# Patient Record
Sex: Female | Born: 1970 | Race: White | Hispanic: No | Marital: Married | Smoking: Never smoker
Health system: Southern US, Community
[De-identification: ages and names within clinical notes are randomized; demographics above are authoritative.]

## PROBLEM LIST (undated history)

## (undated) DIAGNOSIS — C4492 Squamous cell carcinoma of skin, unspecified: Secondary | ICD-10-CM

## (undated) DIAGNOSIS — K449 Diaphragmatic hernia without obstruction or gangrene: Secondary | ICD-10-CM

## (undated) DIAGNOSIS — J45909 Unspecified asthma, uncomplicated: Secondary | ICD-10-CM

## (undated) DIAGNOSIS — E785 Hyperlipidemia, unspecified: Secondary | ICD-10-CM

## (undated) DIAGNOSIS — A630 Anogenital (venereal) warts: Secondary | ICD-10-CM

## (undated) DIAGNOSIS — K589 Irritable bowel syndrome without diarrhea: Secondary | ICD-10-CM

## (undated) DIAGNOSIS — T7840XA Allergy, unspecified, initial encounter: Secondary | ICD-10-CM

## (undated) DIAGNOSIS — D229 Melanocytic nevi, unspecified: Secondary | ICD-10-CM

## (undated) DIAGNOSIS — K645 Perianal venous thrombosis: Secondary | ICD-10-CM

## (undated) DIAGNOSIS — N809 Endometriosis, unspecified: Secondary | ICD-10-CM

## (undated) DIAGNOSIS — G43909 Migraine, unspecified, not intractable, without status migrainosus: Secondary | ICD-10-CM

## (undated) DIAGNOSIS — B009 Herpesviral infection, unspecified: Secondary | ICD-10-CM

## (undated) DIAGNOSIS — Z8619 Personal history of other infectious and parasitic diseases: Secondary | ICD-10-CM

## (undated) DIAGNOSIS — R896 Abnormal cytological findings in specimens from other organs, systems and tissues: Secondary | ICD-10-CM

## (undated) HISTORY — PX: BREAST BIOPSY: SHX20

## (undated) HISTORY — DX: Personal history of other infectious and parasitic diseases: Z86.19

## (undated) HISTORY — DX: Diaphragmatic hernia without obstruction or gangrene: K44.9

## (undated) HISTORY — DX: Anogenital (venereal) warts: A63.0

## (undated) HISTORY — DX: Perianal venous thrombosis: K64.5

## (undated) HISTORY — PX: AUGMENTATION MAMMAPLASTY: SUR837

## (undated) HISTORY — DX: Abnormal cytological findings in specimens from other organs, systems and tissues: R89.6

## (undated) HISTORY — DX: Allergy, unspecified, initial encounter: T78.40XA

## (undated) HISTORY — DX: Unspecified asthma, uncomplicated: J45.909

## (undated) HISTORY — DX: Herpesviral infection, unspecified: B00.9

## (undated) HISTORY — DX: Migraine, unspecified, not intractable, without status migrainosus: G43.909

## (undated) HISTORY — DX: Endometriosis, unspecified: N80.9

## (undated) HISTORY — DX: Irritable bowel syndrome, unspecified: K58.9

## (undated) HISTORY — DX: Hyperlipidemia, unspecified: E78.5

---
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
## 1994-03-10 DIAGNOSIS — N809 Endometriosis, unspecified: Secondary | ICD-10-CM

## 1994-03-10 HISTORY — PX: EXPLORATORY LAPAROTOMY: SUR591

## 1994-03-10 HISTORY — DX: Endometriosis, unspecified: N80.9

## 1997-05-10 DIAGNOSIS — IMO0001 Reserved for inherently not codable concepts without codable children: Secondary | ICD-10-CM

## 1997-05-10 HISTORY — DX: Reserved for inherently not codable concepts without codable children: IMO0001

## 1997-10-19 ENCOUNTER — Other Ambulatory Visit: Admission: RE | Admit: 1997-10-19 | Discharge: 1997-10-19 | Payer: Self-pay | Admitting: *Deleted

## 1997-12-04 ENCOUNTER — Encounter: Payer: Self-pay | Admitting: Internal Medicine

## 1998-05-14 ENCOUNTER — Other Ambulatory Visit: Admission: RE | Admit: 1998-05-14 | Discharge: 1998-05-14 | Payer: Self-pay | Admitting: *Deleted

## 1998-07-29 ENCOUNTER — Encounter: Payer: Self-pay | Admitting: Internal Medicine

## 1998-07-29 ENCOUNTER — Encounter: Payer: Self-pay | Admitting: Emergency Medicine

## 1998-07-29 ENCOUNTER — Emergency Department (HOSPITAL_COMMUNITY): Admission: EM | Admit: 1998-07-29 | Discharge: 1998-07-29 | Payer: Self-pay | Admitting: Emergency Medicine

## 1998-09-26 ENCOUNTER — Other Ambulatory Visit: Admission: RE | Admit: 1998-09-26 | Discharge: 1998-09-26 | Payer: Self-pay | Admitting: *Deleted

## 1999-04-03 ENCOUNTER — Other Ambulatory Visit: Admission: RE | Admit: 1999-04-03 | Discharge: 1999-04-03 | Payer: Self-pay | Admitting: *Deleted

## 1999-08-11 DIAGNOSIS — A630 Anogenital (venereal) warts: Secondary | ICD-10-CM

## 1999-08-11 HISTORY — DX: Anogenital (venereal) warts: A63.0

## 1999-09-30 ENCOUNTER — Other Ambulatory Visit: Admission: RE | Admit: 1999-09-30 | Discharge: 1999-09-30 | Payer: Self-pay | Admitting: *Deleted

## 2000-02-08 HISTORY — PX: BREAST ENHANCEMENT SURGERY: SHX7

## 2000-04-28 ENCOUNTER — Other Ambulatory Visit: Admission: RE | Admit: 2000-04-28 | Discharge: 2000-04-28 | Payer: Self-pay | Admitting: *Deleted

## 2000-05-11 ENCOUNTER — Encounter: Admission: RE | Admit: 2000-05-11 | Discharge: 2000-05-11 | Payer: Self-pay | Admitting: *Deleted

## 2000-05-11 ENCOUNTER — Encounter: Payer: Self-pay | Admitting: *Deleted

## 2000-07-10 HISTORY — PX: BREAST MASS EXCISION: SHX1267

## 2000-07-26 ENCOUNTER — Encounter (INDEPENDENT_AMBULATORY_CARE_PROVIDER_SITE_OTHER): Payer: Self-pay

## 2000-07-26 ENCOUNTER — Ambulatory Visit (HOSPITAL_COMMUNITY): Admission: RE | Admit: 2000-07-26 | Discharge: 2000-07-26 | Payer: Self-pay | Admitting: General Surgery

## 2001-06-30 ENCOUNTER — Other Ambulatory Visit: Admission: RE | Admit: 2001-06-30 | Discharge: 2001-06-30 | Payer: Self-pay | Admitting: *Deleted

## 2002-07-19 ENCOUNTER — Other Ambulatory Visit: Admission: RE | Admit: 2002-07-19 | Discharge: 2002-07-19 | Payer: Self-pay | Admitting: *Deleted

## 2002-12-20 ENCOUNTER — Other Ambulatory Visit: Admission: RE | Admit: 2002-12-20 | Discharge: 2002-12-20 | Payer: Self-pay | Admitting: *Deleted

## 2003-07-27 ENCOUNTER — Other Ambulatory Visit: Admission: RE | Admit: 2003-07-27 | Discharge: 2003-07-27 | Payer: Self-pay | Admitting: *Deleted

## 2003-11-26 ENCOUNTER — Other Ambulatory Visit: Admission: RE | Admit: 2003-11-26 | Discharge: 2003-11-26 | Payer: Self-pay | Admitting: *Deleted

## 2004-01-03 ENCOUNTER — Other Ambulatory Visit: Admission: RE | Admit: 2004-01-03 | Discharge: 2004-01-03 | Payer: Self-pay | Admitting: *Deleted

## 2004-02-06 ENCOUNTER — Ambulatory Visit (HOSPITAL_COMMUNITY): Admission: RE | Admit: 2004-02-06 | Discharge: 2004-02-06 | Payer: Self-pay | Admitting: Internal Medicine

## 2004-02-06 ENCOUNTER — Encounter (INDEPENDENT_AMBULATORY_CARE_PROVIDER_SITE_OTHER): Payer: Self-pay | Admitting: Specialist

## 2004-02-06 ENCOUNTER — Encounter (INDEPENDENT_AMBULATORY_CARE_PROVIDER_SITE_OTHER): Payer: Self-pay | Admitting: *Deleted

## 2004-02-06 ENCOUNTER — Encounter: Payer: Self-pay | Admitting: Internal Medicine

## 2004-07-30 ENCOUNTER — Other Ambulatory Visit: Admission: RE | Admit: 2004-07-30 | Discharge: 2004-07-30 | Payer: Self-pay | Admitting: *Deleted

## 2005-01-08 ENCOUNTER — Other Ambulatory Visit: Admission: RE | Admit: 2005-01-08 | Discharge: 2005-01-08 | Payer: Self-pay | Admitting: *Deleted

## 2005-04-17 ENCOUNTER — Other Ambulatory Visit: Admission: RE | Admit: 2005-04-17 | Discharge: 2005-04-17 | Payer: Self-pay | Admitting: *Deleted

## 2005-06-30 IMAGING — NM NM HEPATO W/GB/PHARM/[PERSON_NAME]
1 series · 6 of 6 positions shown · non-contrast
Comparison: none

CLINICAL DATA: Abdominal pain.  
 HEPATOBILIARY NUCLEAR MEDICINE STUDY WITH EJECTION FRACTION
 After the intravenous injection of 8.3 mCi of 22mMc labeled Choletec, a series of scans of the liver showed prompt excretion by the normal size and contoured liver.  There is definite filling of the gallbladder at 15 minutes and emptying into the bowel at 20 minutes.  The patient was then given 1.4 mcg of CCK and revealed a 30-minute ejection fraction of 35% and a 60-minute ejection fraction of 44% both of which are within the limits of normal. 
 IMPRESSION
 Normal radioisotopic hepatobiliary scan with ejection fraction of 44% at 60 minutes.

[hepatobiliary · 1.20mm/px · 6 of 7 frames shown]
[frame 1/7]
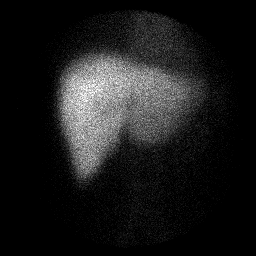
[frame 2/7]
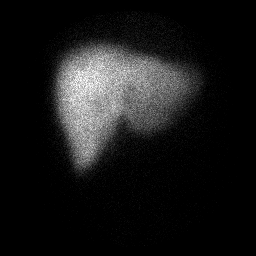
[frame 3/7]
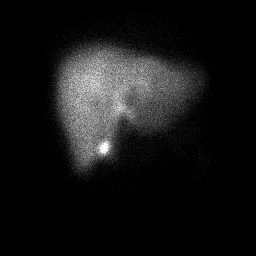
[frame 4/7]
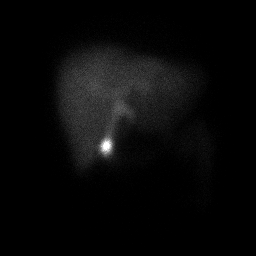
[frame 5/7]
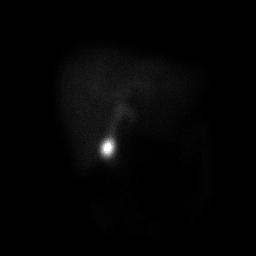
[frame 7/7]
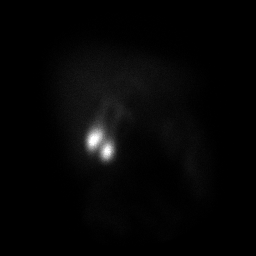

[6 of 6 positions shown; findings below may reference images not displayed]

## 2005-07-07 ENCOUNTER — Other Ambulatory Visit: Admission: RE | Admit: 2005-07-07 | Discharge: 2005-07-07 | Payer: Self-pay | Admitting: Obstetrics and Gynecology

## 2005-09-03 ENCOUNTER — Other Ambulatory Visit: Admission: RE | Admit: 2005-09-03 | Discharge: 2005-09-03 | Payer: Self-pay | Admitting: Obstetrics and Gynecology

## 2006-09-30 ENCOUNTER — Other Ambulatory Visit: Admission: RE | Admit: 2006-09-30 | Discharge: 2006-09-30 | Payer: Self-pay | Admitting: Obstetrics & Gynecology

## 2006-10-15 DIAGNOSIS — D229 Melanocytic nevi, unspecified: Secondary | ICD-10-CM

## 2006-10-15 HISTORY — DX: Melanocytic nevi, unspecified: D22.9

## 2006-11-23 ENCOUNTER — Other Ambulatory Visit: Admission: RE | Admit: 2006-11-23 | Discharge: 2006-11-23 | Payer: Self-pay | Admitting: Obstetrics and Gynecology

## 2007-01-11 ENCOUNTER — Ambulatory Visit: Payer: Self-pay | Admitting: Internal Medicine

## 2007-02-10 ENCOUNTER — Other Ambulatory Visit: Admission: RE | Admit: 2007-02-10 | Discharge: 2007-02-10 | Payer: Self-pay | Admitting: Obstetrics and Gynecology

## 2007-07-18 ENCOUNTER — Other Ambulatory Visit: Admission: RE | Admit: 2007-07-18 | Discharge: 2007-07-18 | Payer: Self-pay | Admitting: Obstetrics and Gynecology

## 2007-11-25 ENCOUNTER — Other Ambulatory Visit: Admission: RE | Admit: 2007-11-25 | Discharge: 2007-11-25 | Payer: Self-pay | Admitting: Obstetrics and Gynecology

## 2007-12-31 DIAGNOSIS — K645 Perianal venous thrombosis: Secondary | ICD-10-CM | POA: Insufficient documentation

## 2008-01-20 ENCOUNTER — Other Ambulatory Visit: Admission: RE | Admit: 2008-01-20 | Discharge: 2008-01-20 | Payer: Self-pay | Admitting: Obstetrics and Gynecology

## 2008-02-15 ENCOUNTER — Emergency Department (HOSPITAL_COMMUNITY): Admission: EM | Admit: 2008-02-15 | Discharge: 2008-02-15 | Payer: Self-pay | Admitting: Emergency Medicine

## 2008-03-04 ENCOUNTER — Emergency Department (HOSPITAL_COMMUNITY): Admission: EM | Admit: 2008-03-04 | Discharge: 2008-03-04 | Payer: Self-pay | Admitting: Emergency Medicine

## 2008-11-29 ENCOUNTER — Other Ambulatory Visit: Admission: RE | Admit: 2008-11-29 | Discharge: 2008-11-29 | Payer: Self-pay | Admitting: Obstetrics and Gynecology

## 2009-05-08 ENCOUNTER — Telehealth: Payer: Self-pay | Admitting: Internal Medicine

## 2009-05-14 DIAGNOSIS — K5900 Constipation, unspecified: Secondary | ICD-10-CM | POA: Insufficient documentation

## 2009-05-14 DIAGNOSIS — K5901 Slow transit constipation: Secondary | ICD-10-CM | POA: Insufficient documentation

## 2009-05-14 DIAGNOSIS — A09 Infectious gastroenteritis and colitis, unspecified: Secondary | ICD-10-CM | POA: Insufficient documentation

## 2009-05-14 DIAGNOSIS — K589 Irritable bowel syndrome without diarrhea: Secondary | ICD-10-CM | POA: Insufficient documentation

## 2009-05-20 ENCOUNTER — Ambulatory Visit: Payer: Self-pay | Admitting: Internal Medicine

## 2009-05-20 LAB — CONVERTED CEMR LAB: Vit D, 25-Hydroxy: 34 ng/mL (ref 30–89)

## 2009-05-22 LAB — CONVERTED CEMR LAB
ALT: 28 units/L (ref 0–35)
AST: 30 units/L (ref 0–37)
Albumin: 4.4 g/dL (ref 3.5–5.2)
Alkaline Phosphatase: 63 units/L (ref 39–117)
BUN: 13 mg/dL (ref 6–23)
Basophils Absolute: 0.4 10*3/uL — ABNORMAL HIGH (ref 0.0–0.1)
Basophils Relative: 4.6 % — ABNORMAL HIGH (ref 0.0–3.0)
CO2: 28 meq/L (ref 19–32)
Calcium: 9.5 mg/dL (ref 8.4–10.5)
Chloride: 101 meq/L (ref 96–112)
Creatinine, Ser: 0.8 mg/dL (ref 0.4–1.2)
Eosinophils Absolute: 0.1 10*3/uL (ref 0.0–0.7)
Eosinophils Relative: 0.8 % (ref 0.0–5.0)
GFR calc non Af Amer: 84.5 mL/min (ref >60)
Glucose, Bld: 78 mg/dL (ref 70–99)
HCT: 40.7 % (ref 36.0–46.0)
Hemoglobin: 13.8 g/dL (ref 12.0–15.0)
Iron: 125 ug/dL (ref 42–145)
Lymphocytes Relative: 27.4 % (ref 12.0–46.0)
Lymphs Abs: 2.5 10*3/uL (ref 0.7–4.0)
MCHC: 34 g/dL (ref 30.0–36.0)
MCV: 101.9 fL — ABNORMAL HIGH (ref 78.0–100.0)
Monocytes Absolute: 0.9 10*3/uL (ref 0.1–1.0)
Monocytes Relative: 9.6 % (ref 3.0–12.0)
Neutro Abs: 5.4 10*3/uL (ref 1.4–7.7)
Neutrophils Relative %: 57.6 % (ref 43.0–77.0)
Platelets: 322 10*3/uL (ref 150.0–400.0)
Potassium: 4.5 meq/L (ref 3.5–5.1)
RBC: 3.99 M/uL (ref 3.87–5.11)
RDW: 12.3 % (ref 11.5–14.6)
Saturation Ratios: 29.3 % (ref 20.0–50.0)
Sodium: 140 meq/L (ref 135–145)
TSH: 1.69 microintl units/mL (ref 0.35–5.50)
Total Bilirubin: 1 mg/dL (ref 0.3–1.2)
Total Protein: 7.6 g/dL (ref 6.0–8.3)
Transferrin: 304.6 mg/dL (ref 212.0–360.0)
WBC: 9.3 10*3/uL (ref 4.5–10.5)

## 2009-05-23 ENCOUNTER — Ambulatory Visit: Payer: Self-pay | Admitting: Internal Medicine

## 2009-05-23 LAB — CONVERTED CEMR LAB: Retic Ct Pct: 1.2 % (ref 0.4–3.1)

## 2009-05-27 LAB — CONVERTED CEMR LAB
Folate: >20 ng/mL
Vitamin B-12: 620 pg/mL (ref 211–911)

## 2009-05-30 ENCOUNTER — Ambulatory Visit (HOSPITAL_COMMUNITY): Admission: RE | Admit: 2009-05-30 | Discharge: 2009-05-30 | Payer: Self-pay | Admitting: Internal Medicine

## 2009-05-30 ENCOUNTER — Ambulatory Visit: Payer: Self-pay | Admitting: Internal Medicine

## 2009-06-04 ENCOUNTER — Encounter: Payer: Self-pay | Admitting: Internal Medicine

## 2009-06-17 ENCOUNTER — Telehealth: Payer: Self-pay | Admitting: Internal Medicine

## 2009-10-03 ENCOUNTER — Encounter: Payer: Self-pay | Admitting: Internal Medicine

## 2010-08-31 ENCOUNTER — Encounter: Payer: Self-pay | Admitting: Neurosurgery

## 2010-09-09 NOTE — Assessment & Plan Note (Signed)
Summary: WORSENING CONSTIPATION          [name] [name]    History of Present Illness Visit Type: follow up  Primary GI MD: Lina Sar MD Primary Provider: n/a Requesting Provider: n/a Chief Complaint: Lower abd pain, worsening constipation, and dark blood in stool History of Present Illness:   [name] [name] is a 40 year old white female with a history of irritable bowel syndrome and constipation. We saw her most recently in 2008 with a thrombosed internal hemorrhoid. Her last colonoscopy done in April 1999 was normal to the cecum. Patient comes today complaining of a recurrence in her constipation. 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 She is very busy working 12 hours a day. She has had irritable bowel syndrome for many years which was initially thought to be Crohn's disease. Over the years, it became more apparent that she has functional gastrointestinal oproblems. Her last upper endoscopy in 2005 showed a 23 cm hiatal hernia. She does not take any acid reducers although she is nauseated at times. Her upper abdominal ultrasound in 1992 was negative. She has taken over-the-counter laxatives such as magnesium citrate. MiraLax does not seem to help.   GI Review of Systems      Denies abdominal pain, acid reflux, belching, bloating, chest pain, dysphagia with liquids, dysphagia with solids, heartburn, loss of appetite, nausea, vomiting, vomiting blood, weight loss, and  weight gain.      Reports constipation and  rectal bleeding.     Denies anal fissure, black tarry stools, change in bowel habit, diarrhea, diverticulosis, fecal incontinence, heme positive stool, hemorrhoids, irritable bowel syndrome, jaundice, light color stool, liver problems, and  rectal pain.    Current Medications (verified): 1)  None  Allergies (verified): 1)  ! Sulfa 2)  ! Jonne Ply  Past History:  Past Medical History: Reviewed history from 05/14/2009 and no changes required. Current Problems:  CONSTIPATION  (ICD-564.00) Hx of INFECTIOUS COLITIS ENTERITIS AND GASTROENTERITIS (ICD-009.0) IRRITABLE BOWEL SYNDROME (ICD-564.1) INTERNAL THROMBOSED HEMORRHOIDS (ICD-455.1)      Past Surgical History: Reviewed history from 05/14/2009 and no changes required. unremarkable  Family History: Reviewed history from 05/14/2009 and no changes required. Family History of Irritable Bowel Syndrome: Mother Family History of Peptic Ulcer Disease: Grandfather No FH of Colon Cancer:  Social History: Reviewed history from 05/14/2009 and no changes required. Alcohol Use - no Illicit Drug Use - no  Review of Systems  The patient denies allergy/sinus, anemia, anxiety-new, arthritis/joint pain, back pain, blood in urine, breast changes/lumps, change in vision, confusion, cough, coughing up blood, depression-new, fainting, fatigue, fever, headaches-new, hearing problems, heart murmur, heart rhythm changes, itching, menstrual pain, muscle pains/cramps, night sweats, nosebleeds, pregnancy symptoms, shortness of breath, skin rash, sleeping problems, sore throat, swelling of feet/legs, swollen lymph glands, thirst - excessive , urination - excessive , urination changes/pain, urine leakage, vision changes, and voice change.         Pertinent positive and negative review of systems were noted in the above HPI. All other ROS was otherwise negative.   Vital Signs:  Patient profile:   40 year old female Height:      67 inches Weight:      126 pounds BMI:     19.81 BSA:     1.66 Pulse rate:   100 / minute Pulse rhythm:   regular BP sitting:   118 / 74  (left arm) Cuff size:   regular  Vitals Entered By: Ok Anis CMA (May 20, 2009 9:11 AM)  Physical Exam  General:  healthy-appearing, alert, oriented and very cooperative. Eyes:  nonicteric. Mouth:  no oral ulcers. Neck:  thyroid normal. Lungs:  Clear throughout to auscultation. Heart:  rapid S1-S2, no murmur. Abdomen:  soft abdomen with normal active  bowel sounds, few high-pitched rushes. Initial tenderness in the right lower quadrant disappeared after repeated palpation,slight tympany throughout the abdomen. Liver edge at costal margin, no CVA tenderness. Rectal:  normal rectal tone. Stool is soft and hemoccult-negative. Extremities:  No clubbing, cyanosis, edema or deformities noted. Skin:  there are no skin lesions. There was a tattoo on the right ankle. Psych:  Alert and cooperative. Normal mood and affect.   Impression & Recommendations:  Problem # 1:  IRRITABLE BOWEL SYNDROME (ICD-564.1) Patient has irritable bowel syndrome with predominant constipation as well as a long history of functional gastrointestinal problems. We will give her a trial ofAmitiza 24 mcg twice a day and an empirical trial of Xifaxin 550 mg one tablet 3 times a day. We will schedule a colonoscopy and upper abdominal ultrasound as well. I would also give consideration to using psychotropic agents such as Paxil. We will obtain baseline chemistries today.  Orders: TLB-CBC Platelet - w/Differential (85025-CBCD) TLB-IBC Pnl (Iron/FE;Transferrin) (83550-IBC) TLB-TSH (Thyroid Stimulating Hormone) (84443-TSH) TLB-CMP (Comprehensive Metabolic Pnl) (80053-COMP) T-Vitamin D (25-Hydroxy) ([address])  Problem # 2:  INTERNAL THROMBOSED HEMORRHOIDS (ICD-455.1) not a problem at this time.  Other Orders: Colonoscopy (Colon) Ultrasound Abdomen (UAS)  Patient Instructions: 1)  CBC, metabolic panel, TSH, sedimentation rate, vitamin D levels, and iron levels. 2)  Xifaxin 550 mg one p.o. t.i.d. 3)  Amitiza 24 mcg p.o. b.i.d. 4)  Colonoscopy 5)  Abdominal ultrasound Prescriptions: DULCOLAX 5 MG  TBEC (BISACODYL) Day before procedure take 2 at 3pm and 2 at 8pm.  #4 x 0   Entered by:   Hortense Ramal CMA (AAMA)   Authorized by:   Hart Carwin MD   Signed by:   Hortense Ramal CMA (AAMA) on 05/20/2009   Method used:   [facility]        [facility]  [address] (retail)       [address]       [place], [place]  [address]       Ph: [phone] or [phone]       Fax: [phone]   RxID:   [id number] REGLAN 10 MG  TABS (METOCLOPRAMIDE HCL) As per prep instructions.  #2 x 0   Entered by:   Hortense Ramal CMA (AAMA)   Authorized by:   Hart Carwin MD   Signed by:   Hortense Ramal CMA (AAMA) on 05/20/2009   Method used:   [facility]        [facility] (retail)       [address]       [place], [place]  [address]       Ph: [phone] or [phone]       Fax: [phone]   RxID:   [id number] MIRALAX   POWD (POLYETHYLENE GLYCOL 3350) As per prep  instructions.  #255gm x 0   Entered by:   Hortense Ramal CMA (AAMA)   Authorized by:   Hart Carwin MD   Signed by:   Hortense Ramal CMA (AAMA) on 05/20/2009   Method used:   [facility]        [facility] [phone][facility] (retail)       [address]  [place], [place]  [address]       Ph: [phone] or [phone]       Fax: [phone]   RxID:   [id number] AMITIZA 24 MCG CAPS (LUBIPROSTONE) Take 1 tablet by mouth two times a day  #60 x 1   Entered by:   Hortense Ramal CMA (AAMA)   Authorized by:   Hart Carwin MD   Signed by:   Hortense Ramal CMA (AAMA) on 05/20/2009   Method used:   [facility]        [facility] ([facility]ail)       [address]       [place], [place]  [address]       Ph: [phone] or [phone]       Fax: [phone]   RxID:   [id number] XIFAXAN 550 MG TABS (RIFAXIMIN) Take 1 tablet by mouth three times a day x 1 week  you should already have 6 sample tablets.)  #15 x 0   Entered by:   Hortense Ramal CMA (AAMA)   Authorized by:   Hart Carwin MD   Signed by:   Hortense Ramal CMA (AAMA) on 05/20/2009   Method used:   [facility]        [facility] (retail)       [address]       [place], [place]  [address]       Ph: [phone] or [phone]       Fax:  [phone]   RxID:   [id number]

## 2010-09-09 NOTE — Miscellaneous (Signed)
Summary: Alprazolam  Clinical Lists Changes  Medications: Added new medication of ALPRAZOLAM 1 MG TABS (ALPRAZOLAM) Take 1 tablet by mouth at bedtime - Signed Rx of ALPRAZOLAM 1 MG TABS (ALPRAZOLAM) Take 1 tablet by mouth at bedtime;  #30 x 1;  Signed;  Entered by: Hortense Ramal CMA (AAMA);  Authorized by: Hart Carwin MD;  Method used: Printed then [facility] [place], [place]  [address], Ph: [phone] or [phone], Fax: [phone]    Prescriptions: ALPRAZOLAM 1 MG TABS (ALPRAZOLAM) Take 1 tablet by mouth at bedtime  #30 x 1   Entered by:   Hortense Ramal CMA (AAMA)   Authorized by:   Hart Carwin MD   Signed by:   Hortense Ramal CMA (AAMA) on 10/03/2009   Method used:   Printed then faxed to ...       [facility] (retail)       [address]       [place], [place]  [address]       Ph: [phone] or [phone]       Fax: [phone]   RxID:   [id number]

## 2010-09-09 NOTE — Miscellaneous (Signed)
Summary: miralax rx  Clinical Lists Changes  Medications: Added new medication of MIRALAX   POWD (POLYETHYLENE GLYCOL 3350) Take 17 gm once a day - Signed Rx of MIRALAX   POWD (POLYETHYLENE GLYCOL 3350) Take 17 gm once a day;  #1 x 11;  Signed;  Entered by: Burman Foster RN;  Authorized by: Hart Carwin MD;  Method used: [facility] [place], [place]  [address], Ph: [phone] or [phone], Fax: [phone]    Prescriptions: MIRALAX   POWD (POLYETHYLENE GLYCOL 3350) Take 17 gm once a day  #1 x 11   Entered by:   Burman Foster RN   Authorized by:   Hart Carwin MD   Signed by:   Burman Foster RN on 05/30/2009   Method used:   [facility]ronically [facility]        [facility] (retail)       [address]       [place], [place]  [address]       Ph: [phone] or [phone]       Fax: [phone]   RxID:   [id number]

## 2010-09-09 NOTE — Procedures (Signed)
Summary: Gastroenterology colon  Gastroenterology colon   Imported By: Donneta Romberg Endo Tech 12/31/2007 12:09:36  _____________________________________________________________________  External Attachment:    Type:   Image     Comment:   External Document

## 2010-09-09 NOTE — Medication Information (Signed)
Summary: P [facility]  [facility]   Imported By: Lester Custer 06/06/2009 12:31:38  _____________________________________________________________________  External Attachment:    Type:   Image     Comment:   External Document

## 2010-09-09 NOTE — Progress Notes (Signed)
Summary: TRIAGE--constipation   Phone Note Call from Patient Call back at Home Phone [phone]   Caller: Patient Call For: Dr. Juanda Chance Reason for Call: Talk to Nurse Summary of Call: pt going days at a time without having a BM... discomfort in abd...  Initial call taken by: Vallarie Mare,  May 08, 2009 12:24 PM  Follow-up for Phone Call        Pt. c/o worsening constipation. Also has abd. pain when she has a BM, can go up to 4 days without a BM.  Denies blood,black stools, fever,n/v.   1) Miralax 17gm mixed in 8oz. water or juice-daily.May use BID PRN. 2) High fiber diet with ample daily fluids 3) See Dr.Brodie on 05-20-09 at 8:45am. 4) I will call pt., if new orders, after MD reviews.  Follow-up by: Laureen Ochs LPN,  May 08, 2009 12:54 PM  Additional Follow-up for Phone Call Additional follow up Details #1::        reviewed and agree. Additional Follow-up by: Hart Carwin MD,  May 08, 2009 10:50 PM

## 2010-09-09 NOTE — Progress Notes (Signed)
Summary: TRIAGE   Phone Note Call from Patient Call back at Home Phone [phone]   Call For: Dr Juanda Chance Summary of Call: New medicineAmphetamine is causing alot of side effects. Initial call taken by: Leanor Kail [facility],  June 17, 2009 1:45 PM  Follow-up for Phone Call        Pt. began Amphetamine salts 20mg  daily on 06-04-09.  Pt. c/o feeling  less focused, very dizzy, out of control, real jittery, high feeling, bad headaches. She had to stop taking it due to the severe side effects. Pt. declines to see a PCP to get a complete eval. for her issues, wants Dr.Brodie to prescribe something else.  DR.BRODIE PLEASE ADVISE  Follow-up by: Laureen Ochs LPN,  June 17, 2009 2:17 PM  Additional Follow-up for Phone Call Additional follow up Details #1::        She has Xanax to take for anxiety, she may take 1/2 of it during the day. I don/t really know what she needs, her PCP may know. Additional Follow-up by: Hart Carwin MD,  June 17, 2009 8:10 PM    Additional Follow-up for Phone Call Additional follow up Details #2::    Above MD orders reviewed with patient. Pt. instructed to call back as needed.  Follow-up by: Laureen Ochs LPN,  June 18, 2009 10:28 AM

## 2010-09-09 NOTE — Procedures (Signed)
Summary: Colonoscopy  Patient: [name] Record Note: All result statuses are Final unless otherwise noted.  Tests: (1) Colonoscopy (COL)   COL Colonoscopy           DONE     [facility]     [address]     [place], [place]  [address]           COLONOSCOPY PROCEDURE REPORT           PATIENT:  [name] [name], [name] [name]  MR#:  [id number]     BIRTHDATE:  06-01-1971, 37 yrs. old  GENDER:  female           ENDOSCOPIST:  Hedwig Morton. Juanda Chance, MD     Referred by:           PROCEDURE DATE:  05/30/2009     PROCEDURE:  Colonoscopy, Diagnostic     ASA CLASS:  Class I     INDICATIONS:  abdominal pain colon 1999 was normal     hx of ? Crohn's but most likely an IBS, now with progressive     constipation; abd. sono today was normal. Xifaxan, Amitiza,           MEDICATIONS:   Versed 10 mg, Fentanyl 100 mcg           DESCRIPTION OF PROCEDURE:   After the risks benefits and     alternatives of the procedure were thoroughly explained, informed     consent was obtained.  Digital rectal exam was performed and     revealed no rectal masses.   The LB [id number] endoscope     was introduced through the anus and advanced to the cecum, which     was identified by both the appendix and ileocecal valve, without     limitations.  The quality of the prep was poor, using MiraLax.     The instrument was then slowly withdrawn as the colon was fully     examined.     <<PROCEDUREIMAGES>>           FINDINGS:  No polyps or cancers were seen (see image1, image2,     image3, and image4). large amount of sedation, colon sensitive to     pressure of the scope   Retroflexed views in the rectum revealed     no abnormalities.    The scope was then withdrawn from the patient     and the procedure completed.           COMPLICATIONS:  None           ENDOSCOPIC IMPRESSION:     1) No polyps or cancers     2) Normal colonoscopy     RECOMMENDATIONS:     1) high fiber diet     Amitiza 24 ug bid     finish the  Xifaxan     Miralax 17 gm 3x/week           REPEAT EXAM:  In 10 year(s) for.           ______________________________     Hedwig Morton. Juanda Chance, MD           CC:           n.     eSIGNED:   Hedwig Morton. Brodie at 05/30/2009 02:57 PM           Jaquelyn Bitter, [id number]  Note: An exclamation mark (!) indicates a result that was not dispersed into the flowsheet.  Document Creation Date: 05/30/2009 2:57 PM _______________________________________________________________________  (1) Order result status: Final Collection or observation date-time: 05/30/2009 14:46 Requested date-time:  Receipt date-time:  Reported date-time:  Referring Physician:   Ordering Physician: Lina Sar [id number]) Specimen Source:  Source: Launa Grill Order Number: [id number] Lab site:   Appended Document: Colonoscopy    Clinical Lists Changes  Observations: Added new observation of COLONNXTDUE: 05/2019 (05/30/2009 15:12)

## 2010-12-23 NOTE — Assessment & Plan Note (Signed)
[facility]                         GASTROENTEROLOGY OFFICE NOTE   NAME:[name], [name] ORTLOFF                   MRN:          [id number]  DATE:01/11/2007                            DOB:          1970/09/20    [name] [name] is a 40 year old white female with irritable bowel  syndrome and constipation.  She has acute rectal pain which developed 2  weeks ago, which sounds like a thrombosed hemorrhoid.  We asked her  yesterday to come to see [name] to evaluate the problem.  Her last  appointment was 2005 for diarrhea alternating with constipation.  She  does take MiraLax on an occasional basis, as well as Dulcolax tablets.  She has incomplete evacuation.   PHYSICAL EXAMINATION:  Blood pressure 90/66.  Pulse 92.  Weight 136  pounds.  She was alert, oriented, and in no distress.  LUNGS:  Clear to auscultation.  COR:  With normal S1 and normal S2.  Anoscopic exam reveals internal thrombosed hemorrhoids with partial  prolapse, which could be easily reduced.  It is about 1 cm in diameter,  quite tender, but I was able to do the anoscopic exam.  Two other small  hemorrhoids not thrombosed.  Stool was Hemoccult negative.   IMPRESSION:  1. Thrombosed internal hemorrhoid.  2. First grade hemorrhoids.  3. Irritable bowel syndrome with constipation.   PLAN:  1. I think the thrombosed hemorrhoid is actually getting better.  It      is partially absorbed, and not as tense as when it initially      started.  I think conservative treatment may be preferable to      surgical excision and evacuation  We will start Analpram cream 2.5%      to use 3 times a day.  2. Anusol HC suppositories b.i.d.  3. Benefiber samples given, 1 pack daily to improve the bowel habits.     Hedwig Morton. Juanda Chance, MD  Electronically Signed    DMB/MedQ  DD: 01/11/2007  DT: 01/11/2007  Job #: [id number]

## 2010-12-26 NOTE — Op Note (Signed)
[facility]  Patient:    [name] [name], [name] [name]                   MRN: [id number] Proc. Date: 07/26/00 Adm. Date:  [id number] Attending:  Carson Myrtle                           Operative Report  PREOPERATIVE DIAGNOSIS:  Right breast mass.  POSTOPERATIVE DIAGNOSIS:  Right breast mass.  PROCEDURE:  Excision of right breast mass.  SURGEON:  Timothy E. Earlene Plater, M.D.  ANESTHESIA:  Local standby.  INDICATIONS:  This is a 40 year old Caucasian female with a new mass on the right breast.  It has been noted since insertion of implants in July of this year.  She is quite concerned and wishes to proceed.  She is latex allergic.  DESCRIPTION OF PROCEDURE:  The patient was brought to the operating room and placed supine.  Latex allergy precautions observed; IV started sedation given. The right breast prepped and draped in the usual fashion.  The mass was carefully located high in the upper inner quadrant in right breast, skin marked, 0.25% Marcaine with epinephrine used for local anesthesia.  A small incision over the palpable mass.  The subcutaneous tissue dissected, the mass identified and bluntly dissected from surrounding breast tissue.  Submitted in saline to the pathology lab.  Cautery used for hemostasis.  The breast tissue approximated with 3-0 Vicryl.  Skin closed with 4-0 Monocryl.  Steri-Strips applied.  She tolerated it well.  There were no complications.  Counts correct.  She was removed to the recovery room in good condition.  Written and verbal instructions were given to her and her family including Darvocet-N #24,  She will be seen and followed as an outpatient. DD:  07/26/00 TD:  07/26/00 Job: [id number]

## 2010-12-26 NOTE — Op Note (Signed)
NAME:  [name] [name], [name] [name]                      ACCOUNT NO.:  [id number]   MEDICAL RECORD NO.:  [id number]                   PATIENT TYPE:  AMB   LOCATION:  ENDO                                 FACILITY:  [facility]   PHYSICIAN:  Lina Sar, M.D. [facility]               DATE OF BIRTH:  1971/04/01   DATE OF PROCEDURE:  02/06/2004  DATE OF DISCHARGE:                                 OPERATIVE REPORT   PROCEDURE:  Upper endoscopy.   INDICATIONS:  This 40 year old white female has chronic gastrointestinal  problems which were initially attributed to Crohn's disease but later on the  diagnosis has been changed to irritable bowel syndrome.  She has recently  had more nausea and vomiting as well as diarrhea and constipation.  She has  a history of gastroesophageal reflux for which she is taking Aciphex 20 mg a  day.  There has been no significant weight loss.  Her stool was hemoccult  negative.  Her HIDA scan this morning after a normal ultrasound showed  normal ejection fraction after CCK was given of 44%, normal being above 35%.  She is undergoing upper endoscopy to further evaluate her upper GI symptoms  of nausea and vomiting.   ENDOSCOPE:  Olympus single-channel videoscope.   SEDATION:  Versed 7 mg IV, fentanyl 100 mcg IV.   FINDINGS:  Olympus single-channel videoscope passed under direct vision  though the posterior pharynx into the esophagus.  The patient was monitored  by pulse oximeter.  Oxygen saturations were normal.  Proximal and distal  esophageal mucosa was unremarkable.  There was a normal squamocolumnar  junction.   Stomach:  The stomach was insufflated with air and showed small, 2-3 cm  hiatal hernia which was reducible.  Squamocolumnar junction was normal.  There were no erosions within the hiatal hernia.  No retained food or bile.  Body of the stomach was normal with normal gastric antrum.  Pyloric outlet  was unremarkable.  Retroflexion of endoscope confirmed presence of  small  hiatal hernia.   Duodenum:  Duodenal bulb and descending duodenum was normal.  There was  mild, nonspecific erythema over the descending duodenum.  Biopsies were  taken to rule out villous atrophy.  The endoscope was then retracted and  stomach decompressed.  The patient tolerated the procedure well.   IMPRESSION:  1. Essentially normal esophagus, stomach, and duodenum with minimal erythema     of the duodenal bulb status post small bowel biopsies.  2. Status post CLOtest.  3. Small reducible hiatal hernia.   PLAN:  1. The patient is to continue on Aciphex but will increase to 20 mg twice a     day temporarily to achieve better acid suppression.  2. She has already Donnatal for crampy abdominal pain.  3. MiraLax a half cup three times a week for constipation.   She will follow up with me in the office.  Lina Sar, M.D. [facility]    DB/MEDQ  D:  02/06/2004  T:  02/06/2004  Job:  [id number]

## 2010-12-26 NOTE — Assessment & Plan Note (Signed)
[facility]                                 ON-CALL NOTE   NAME:[name] [name], [name] [name]                   MRN:          [id number]  DATE:01/13/2007                            DOB:          [birth year]    Ms. Kuennen called tonight at 7:15.  She apparently had hemorrhoid  surgery today at [facility] and has had quite a lot of  pain since then.  She has not called the surgeon that performed the  hemorrhoidectomy.  She was asking for pain medicines.  I explained that  I am not comfortable giving her pain medicines at this point; it is  definitely best that she contact the surgeon who just performed the  procedure on her for that kind of medication.     Rachael Fee, MD  Electronically Signed    DPJ/MedQ  DD: 01/13/2007  DT: 01/14/2007  Job #: [id number]   cc:   Hedwig Morton. Juanda Chance, MD

## 2011-06-16 ENCOUNTER — Telehealth: Payer: Self-pay | Admitting: Internal Medicine

## 2011-06-16 DIAGNOSIS — R197 Diarrhea, unspecified: Secondary | ICD-10-CM

## 2011-06-16 MED ORDER — METRONIDAZOLE 250 MG PO TABS: ORAL_TABLET | ORAL | Status: DC

## 2011-06-16 NOTE — Telephone Encounter (Signed)
Patient calling to report nausea, vomiting, diarrhea(really green in color) and abdominal pain at her belly button and back pain that started on Friday and has gotten worse. States the pain was worse at 3 AM this morning. She report chills but does not know if she has fever. She returned from the [place] last Monday. Two people that were on the trip have parasites.Spoke with Dr. Juanda Chance. Orders given for Flagyl 250 mg  PO TID with food #21 , Stool for O&P, Lactoferrin. Patient aware. She will come in for stool samples.

## 2011-06-19 ENCOUNTER — Other Ambulatory Visit: Payer: Self-pay

## 2011-06-19 DIAGNOSIS — R197 Diarrhea, unspecified: Secondary | ICD-10-CM

## 2011-06-20 LAB — FECAL LACTOFERRIN, QUANT: Lactoferrin: POSITIVE

## 2011-06-22 ENCOUNTER — Telehealth: Payer: Self-pay | Admitting: *Deleted

## 2011-06-22 LAB — OVA AND PARASITE SCREEN: OP: NONE SEEN

## 2011-06-22 MED ORDER — CIPROFLOXACIN HCL 250 MG PO TABS: 250.0000 mg | ORAL_TABLET | Freq: Two times a day (BID) | ORAL | Status: AC

## 2011-06-22 NOTE — Telephone Encounter (Signed)
Message copied by Daphine Deutscher on Mon Jun 22, 2011 10:35 AM ------      Message from: Hart Carwin      Created: Mon Jun 22, 2011 10:22 AM       Please call pt to inquire about status on Flagyl if diarrhea persists, start Cipro 250 mg po bid, #20

## 2011-06-22 NOTE — Telephone Encounter (Signed)
Spoke with patient and gave her the results. She states she is having diarrhea off and on still but admits she did not start the Flagyl because it made her stomach hurt in the past. She will start the Flagyl now. Do you still want to add the Cipro? Please, advise.

## 2011-06-22 NOTE — Telephone Encounter (Signed)
Yes, please take Cipro and Flagyl for 1 week.

## 2011-06-22 NOTE — Telephone Encounter (Signed)
Spoke with patient and gave her Dr. Brodie's recommendations. Rx sent to pharmacy. 

## 2011-06-30 ENCOUNTER — Telehealth: Payer: Self-pay | Admitting: Internal Medicine

## 2011-06-30 NOTE — Telephone Encounter (Signed)
Patient calling for results of stool speciman. Results given. She states her stomach has been hurting since taking the antibiotics. She would like to schedule OV. Offered an appointment on 07/06/11 but patient could not come. Scheduled her on 07/08/11 at 2:15 PM with Dr. Juanda Chance.

## 2011-07-05 ENCOUNTER — Telehealth: Payer: Self-pay | Admitting: Internal Medicine

## 2011-07-05 NOTE — Telephone Encounter (Signed)
Pt called stating she is severely constipated despite Mag Citrate, fleets enema and "other laxatives".  Had very scant amount of stool today. Some nausea, no vomiting.  Some abd distention which is uncomfortable, but not severe pain. Question of low grade fever. Reports had difficulty with enema, not with insertion of applicator, but with dispensing the medication into the rectum.  This raises the question of fecal impaction. Has appt on Wed with Dr. Juanda Chance. I rec mineral oil OTC enema and hold of further oral laxatives tonight. Also, rec that if her condition worsening in that she develops worsening abd pain, or vomiting that she seek care in ED tonight She voiced understanding. Office can f/u tomorrow. She thanked me for the call.

## 2011-07-06 NOTE — Telephone Encounter (Signed)
Left message to call back  

## 2011-07-07 NOTE — Telephone Encounter (Signed)
Left message for patient to call back. She has an appointment with Dr Juanda Chance tomorrow. Therefore, we will await a return call or will just see her in the office tomorrow.

## 2011-07-08 ENCOUNTER — Encounter: Payer: Self-pay | Admitting: Internal Medicine

## 2011-07-08 ENCOUNTER — Ambulatory Visit (INDEPENDENT_AMBULATORY_CARE_PROVIDER_SITE_OTHER): Payer: BC Managed Care – PPO | Admitting: Internal Medicine

## 2011-07-08 DIAGNOSIS — R109 Unspecified abdominal pain: Secondary | ICD-10-CM

## 2011-07-08 MED ORDER — ONDANSETRON HCL 4 MG PO TABS: 4.0000 mg | ORAL_TABLET | Freq: Three times a day (TID) | ORAL | Status: DC | PRN

## 2011-07-08 MED ORDER — ESOMEPRAZOLE MAGNESIUM 40 MG PO CPDR: 40.0000 mg | DELAYED_RELEASE_CAPSULE | Freq: Every day | ORAL | Status: DC

## 2011-07-08 MED ORDER — HYOSCYAMINE-PHENYLTOLOXAMINE 0.0625-15 MG PO CAPS: ORAL_CAPSULE | ORAL | Status: DC

## 2011-07-08 NOTE — Progress Notes (Signed)
[name] [name] 1971-04-13 MRN [id number]    History of Present Illness:  This is a 40 year old white female with irritable bowel syndrome and history of questionable Crohn's disease which was never diagnosed. She developed gastroenteritis after returning from the [place] one month ago. Her stool studies were negative. She was prescribed Flagyl 250 mg 3 times a day but did not take it except for 3 days. We subsequently sent her Cipro 250 mg twice a day which she also took only for 3 days. She denies having diarrhea. Her main symptom is crampy abdominal pain and nausea, distention and continued weight gain. Her upper abdominal ultrasound and HIDA scan in October 2010 were negative and her last colonoscopy October 2010 was normal. She has a history of severe constipation for which she took Amitiza.    Past Medical History  Diagnosis Date  . IBS (irritable bowel syndrome)   . Internal thrombosed hemorrhoids   . Hiatal hernia   . History of campylobacteriosis    Past Surgical History  Procedure Date  . Breast enhancement surgery     reports that she has never smoked. She has never used smokeless tobacco. She reports that she does not drink alcohol or use illicit drugs. family history includes Hernia in her mother; Irritable bowel syndrome in her mother; Leukemia in her maternal grandfather; and Ulcers in her maternal grandfather.  There is no history of Colon cancer. Allergies  Allergen Reactions  . Aspirin     REACTION: gi upset  . Latex   . Sulfonamide Derivatives         Review of Systems: Denies heartburn at positive for indigestion. Positive for bloating gas abdominal pain  The remainder of the 10 point ROS is negative except as outlined in H&P   Physical Exam: General appearance  Well developed, in no distress. Eyes- non icteric. HEENT nontraumatic, normocephalic. Mouth no lesions, tongue papillated, no cheilosis. Neck supple without adenopathy, thyroid not  enlarged, no carotid bruits, no JVD. Lungs Clear to auscultation bilaterally. Cor normal S1, normal S2, regular rhythm, no murmur,  quiet precordium. Abdomen: Mildly protuberant. Soft with hyperactive bowel sounds with top normal rashes hives tenderness mostly right lower quadrant. Rectal: Soft Hemoccult negative stool. Extremities no pedal edema. Skin no lesions. Neurological alert and oriented x 3. Psychological normal mood and affect.  Assessment and Plan:  Problem #1 Issues with irritable bowel syndrome. Patient has a history of recent gastroenteritis which has persisted. 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 We need to rule out other causes. We will proceed with a CT scan of the abdomen and pelvis as well as an upper endoscopy. We will start her on Nexium 40 mg daily. Samples were given for her to take twice daily. She also needs Zofran 4 mg to take on a when necessary basis.   07/08/2011 Lina Sar

## 2011-07-08 NOTE — Patient Instructions (Signed)
We have sent the following medications to your pharmacy for you to pick up at your convenience: Zofran 4 mg Nexium 40 mg daily  You have been scheduled for a CT scan of the abdomen and pelvis at [facility] ([address]---this is in the same building as [facility]).   You are scheduled on Friday 07/10/11 at 10 am. You should arrive 15 minutes prior to your appointment time for registration. Please follow the written instructions below on the day of your exam:  WARNING: IF YOU ARE ALLERGIC TO IODINE/X-RAY DYE, PLEASE NOTIFY RADIOLOGY IMMEDIATELY AT [phone]! YOU WILL BE GIVEN A 13 HOUR PREMEDICATION PREP.  1) Do not eat or drink anything after 6:00 am (4 hours prior to your test) 2) You have been given 2 bottles of oral contrast to drink. The solution may taste better if refrigerated, but do NOT add ice or any other liquid to this solution. Shake well before drinking.    Drink 1 bottle of contrast @ 8:00 am (2 hours prior to your exam)  Drink 1 bottle of contrast @ 9:00 am (1 hour prior to your exam)  You may take any medications as prescribed with a small amount of water except for the following: Metformin, Glucophage, Glucovance, Avandamet, Riomet, Fortamet, Actoplus Met, Janumet, Glumetza or Metaglip. The above medications must be held the day of the exam AND 48 hours after the exam.  The purpose of you drinking the oral contrast is to aid in the visualization of your intestinal tract. The contrast solution may cause some diarrhea. Before your exam is started, you will be given a small amount of fluid to drink. Depending on your individual set of symptoms, you may also receive an intravenous injection of x-ray contrast/dye. Plan on being at [facility] for 30 minutes or long, depending on the type of exam you are having performed.  If you have any questions regarding your exam or if you need to reschedule, you may call the CT department at [phone]  between the hours of 8:00 am and 5:00 pm, Monday-Friday.  ________________________________________________________________________

## 2011-07-09 MED ORDER — HYDROCORTISONE ACETATE 25 MG RE SUPP: 25.0000 mg | Freq: Every day | RECTAL | Status: AC

## 2011-07-10 ENCOUNTER — Other Ambulatory Visit: Payer: BC Managed Care – PPO

## 2011-07-15 ENCOUNTER — Ambulatory Visit (INDEPENDENT_AMBULATORY_CARE_PROVIDER_SITE_OTHER)
Admission: RE | Admit: 2011-07-15 | Discharge: 2011-07-15 | Disposition: A | Discharge status: Home / Self Care | Payer: BC Managed Care – PPO | Source: Ambulatory Visit | Attending: Internal Medicine | Admitting: Internal Medicine

## 2011-07-15 DIAGNOSIS — R109 Unspecified abdominal pain: Secondary | ICD-10-CM

## 2011-07-15 MED ORDER — IOHEXOL 300 MG/ML  SOLN
100.0000 mL | Freq: Once | INTRAMUSCULAR | Status: AC | PRN
  Administered 2011-07-15: 100 mL via INTRAVENOUS

## 2011-07-21 ENCOUNTER — Other Ambulatory Visit: Payer: Self-pay | Admitting: Internal Medicine

## 2011-07-21 MED ORDER — FLUCONAZOLE 100 MG PO TABS: 100.0000 mg | ORAL_TABLET | Freq: Every day | ORAL | Status: AC

## 2011-07-21 NOTE — Telephone Encounter (Signed)
Diflucan 100mg , #3, 1 po qd

## 2011-07-21 NOTE — Telephone Encounter (Signed)
Patient states that she has gotten a yeast infection since taking antibiotics prescribed by Dr Juanda Chance recently. Dr Juanda Chance, do you want me to send her some diflucan?

## 2011-07-24 ENCOUNTER — Ambulatory Visit (AMBULATORY_SURGERY_CENTER): Payer: BC Managed Care – PPO

## 2011-07-24 DIAGNOSIS — R109 Unspecified abdominal pain: Secondary | ICD-10-CM

## 2011-07-27 ENCOUNTER — Ambulatory Visit (AMBULATORY_SURGERY_CENTER): Payer: BC Managed Care – PPO | Admitting: Internal Medicine

## 2011-07-27 ENCOUNTER — Encounter: Payer: Self-pay | Admitting: Internal Medicine

## 2011-07-27 VITALS — BP 127/86 | HR 89 | Temp 98.0°F | Resp 18 | Ht 67.0 in | Wt 159.0 lb

## 2011-07-27 DIAGNOSIS — R109 Unspecified abdominal pain: Secondary | ICD-10-CM

## 2011-07-27 DIAGNOSIS — K2960 Other gastritis without bleeding: Secondary | ICD-10-CM

## 2011-07-27 MED ORDER — HYOSCYAMINE-PHENYLTOLOXAMINE 0.0625-15 MG PO CAPS: 1.0000 | ORAL_CAPSULE | Freq: Three times a day (TID) | ORAL | Status: DC

## 2011-07-27 MED ORDER — SODIUM CHLORIDE 0.9 % IV SOLN: 500.0000 mL | INTRAVENOUS | Status: DC

## 2011-07-27 NOTE — Progress Notes (Signed)
Patient did not experience any of the following events: a burn prior to discharge; a fall within the facility; wrong site/side/patient/procedure/implant event; or a hospital transfer or hospital admission upon discharge from the facility. (G8907) Patient did not have preoperative order for IV antibiotic SSI prophylaxis. (G8918)  

## 2011-07-27 NOTE — Op Note (Signed)
[facility] [address] [place], [place]  [address]  ENDOSCOPY PROCEDURE REPORT  PATIENT:  [name] [name], [name] [name]  MR#:  [id number] BIRTHDATE:  01/28/1971, 40 yrs. old  GENDER:  female  ENDOSCOPIST:  Hedwig Morton. Juanda Chance, MD Referred by:  PROCEDURE DATE:  07/27/2011 PROCEDURE:  EGD with biopsy, 43239 ASA CLASS:  Class I INDICATIONS:  abdominal pain, diarrhea recent travel to [place] hx of IBS  MEDICATIONS:   These medications were titrated to patient response per physician's verbal order, Versed 10 mg, Fentanyl 100 mcg, Benadryl 12.5 mg TOPICAL ANESTHETIC:  Cetacaine Spray  DESCRIPTION OF PROCEDURE:   After the risks benefits and alternatives of the procedure were thoroughly explained, informed consent was obtained.  The LB GIF-H180 [id number] endoscope was introduced through the mouth and advanced to the second portion of the duodenum, without limitations.  The instrument was slowly withdrawn as the mucosa was fully examined. <<PROCEDUREIMAGES>>  Mild gastritis was found. With standard forceps, a biopsy was obtained and sent to pathology.  Otherwise the examination was normal. With standard forceps, a biopsy was obtained and sent to pathology (see image1, image4, image6, image7, image8, image9, image3, and image2). r/o sprue    Retroflexed views revealed no abnormalities.    The scope was then withdrawn from the patient and the procedure completed.  COMPLICATIONS:  None  ENDOSCOPIC IMPRESSION: 1) Mild gastritis 2) Otherwise normal examination RECOMMENDATIONS: 1) Await biopsy results continue Nexiem and Digex  REPEAT EXAM:  In 0 year(s) for.  ______________________________ Hedwig Morton. Juanda Chance, MD  CC:  n. eSIGNED:   Hedwig Morton. Tyleigh Mahn at 07/27/2011 02:57 PM  Jaquelyn Bitter, [phone]

## 2011-07-27 NOTE — Patient Instructions (Signed)
Please refer to your blue and neon green sheets for instructions regarding diet and activity for the rest of today.  You may resume your medications as you would normally take them.  

## 2011-07-28 ENCOUNTER — Telehealth: Payer: Self-pay | Admitting: *Deleted

## 2011-07-28 NOTE — Telephone Encounter (Signed)
Left  message on number given in admitting yesterday. No answer. em

## 2011-08-04 ENCOUNTER — Encounter: Payer: Self-pay | Admitting: Internal Medicine

## 2011-08-18 ENCOUNTER — Telehealth: Payer: Self-pay | Admitting: *Deleted

## 2011-08-18 NOTE — Telephone Encounter (Signed)
Received a call from [facility] pharmacy that patient had an rx for Digex capsules. This drug is no longer available and they would like an alternative. Please, advise.

## 2011-08-18 NOTE — Telephone Encounter (Signed)
I am not aware that Digex if off the shelves. May be another pharmacy may have it. Substitute would be Librax 1 po tid,ac.

## 2011-08-19 NOTE — Telephone Encounter (Signed)
Spoke with pharmacist and Digex is coming up as off market on his computer. Gave him the substitute rx for Librax 1 po TID, ac #90 refill x 1.

## 2011-08-24 ENCOUNTER — Ambulatory Visit (INDEPENDENT_AMBULATORY_CARE_PROVIDER_SITE_OTHER): Payer: BC Managed Care – PPO

## 2011-08-24 DIAGNOSIS — R5381 Other malaise: Secondary | ICD-10-CM

## 2011-08-24 DIAGNOSIS — R635 Abnormal weight gain: Secondary | ICD-10-CM

## 2011-08-24 DIAGNOSIS — R5383 Other fatigue: Secondary | ICD-10-CM

## 2011-08-24 DIAGNOSIS — M542 Cervicalgia: Secondary | ICD-10-CM

## 2011-08-24 DIAGNOSIS — J039 Acute tonsillitis, unspecified: Secondary | ICD-10-CM

## 2011-08-26 ENCOUNTER — Ambulatory Visit (INDEPENDENT_AMBULATORY_CARE_PROVIDER_SITE_OTHER): Payer: BC Managed Care – PPO

## 2011-08-26 DIAGNOSIS — IMO0001 Reserved for inherently not codable concepts without codable children: Secondary | ICD-10-CM

## 2011-08-26 DIAGNOSIS — R209 Unspecified disturbances of skin sensation: Secondary | ICD-10-CM

## 2011-08-26 DIAGNOSIS — N390 Urinary tract infection, site not specified: Secondary | ICD-10-CM

## 2012-03-14 ENCOUNTER — Ambulatory Visit (INDEPENDENT_AMBULATORY_CARE_PROVIDER_SITE_OTHER): Payer: BC Managed Care – PPO | Admitting: Family Medicine

## 2012-03-14 VITALS — BP 120/64 | HR 106 | Temp 98.1°F | Resp 16 | Ht 67.0 in | Wt 145.0 lb

## 2012-03-14 DIAGNOSIS — M79602 Pain in left arm: Secondary | ICD-10-CM

## 2012-03-14 DIAGNOSIS — S51809A Unspecified open wound of unspecified forearm, initial encounter: Secondary | ICD-10-CM

## 2012-03-14 DIAGNOSIS — M79609 Pain in unspecified limb: Secondary | ICD-10-CM

## 2012-03-14 MED ORDER — TRAMADOL HCL 50 MG PO TABS: 50.0000 mg | ORAL_TABLET | Freq: Three times a day (TID) | ORAL | Status: AC | PRN

## 2012-03-14 NOTE — Progress Notes (Signed)
[facility] [address], [place] [address] [phone]  Date:  03/14/2012   Name:  REVA PINKLEY   DOB:  1971/01/25   MRN:  [id number]  PCP:  No primary provider on file.    Chief Complaint: Laceration   History of Present Illness:  [name] [name] is a 41 y.o. very pleasant female patient who presents with the following:  She was cutting some ivy at her grandmother's house last night- she was using a knife.  "I knew it was a bad idea!"  She missed and accidentally cut her left arm.  This happened last night around 8 pm.    She had her last tetanus shot about 3 or 4 years ago.  She is generally healthy, and there is no chance of pregnancy.  She has noted some decreased sensation in the dorsal forearm distal to the cut, but her hand is normal.  She is otherwise unhurt.    Patient Active Problem List  Diagnosis  . INFECTIOUS COLITIS ENTERITIS AND GASTROENTERITIS  . INTERNAL THROMBOSED HEMORRHOIDS  . CONSTIPATION  . IRRITABLE BOWEL SYNDROME    Past Medical History  Diagnosis Date  . IBS (irritable bowel syndrome)   . Internal thrombosed hemorrhoids   . Hiatal hernia   . History of campylobacteriosis     Past Surgical History  Procedure Date  . Breast enhancement surgery     History  Substance Use Topics  . Smoking status: Never Smoker   . Smokeless tobacco: Never Used  . Alcohol Use: No     occasionally    Family History  Problem Relation Age of Onset  . Irritable bowel syndrome Mother   . Ulcers Maternal Grandfather   . Colon cancer Neg Hx   . Leukemia Maternal Grandfather   . Hernia Mother     Allergies  Allergen Reactions  . Aspirin     REACTION: gi upset  . Latex   . Sulfonamide Derivatives     Medication list has been reviewed and updated.  Current Outpatient Prescriptions on File Prior to Visit  Medication Sig Dispense Refill  . ClonazePAM (KLONOPIN PO) Take 1 tablet by mouth daily.        Marland Kitchen esomeprazole  (NEXIUM) 40 MG capsule Take 1 capsule (40 mg total) by mouth daily.  30 capsule  2  . valACYclovir (VALTREX) 1000 MG tablet       . hydrocortisone (ANUSOL-HC) 25 MG suppository Place 1 suppository (25 mg total) rectally at bedtime.  12 suppository  1  . Hyoscyamine-Phenyltoloxamine (DIGEX NF) [id number] MG CAPS Use as directed  12 each  0  . Hyoscyamine-Phenyltoloxamine (DIGEX NF) 6.9629-52 MG CAPS Take 1 capsule by mouth 4 (four) times daily -  before meals and at bedtime.  120 each  3  . metroNIDAZOLE (FLAGYL) 250 MG tablet Take one with food TID  21 tablet  0  . ondansetron (ZOFRAN) 4 MG tablet Take 1 tablet (4 mg total) by mouth every 8 (eight) hours as needed.  20 tablet  0    Review of Systems:  As per HPI- otherwise negative.   Physical Examination: Filed Vitals:   03/14/12 1035  BP: 120/64  Pulse: 106  Temp: 98.1 F (36.7 C)  Resp: 16   Filed Vitals:   03/14/12 1035  Height: 5\' 7"  (1.702 m)  Weight: 145 lb (65.772 kg)   Body mass index is 22.71 kg/(m^2). Ideal Body Weight: Weight in (lb) to have BMI =  25: 159.3    GEN: WDWN, NAD, Non-toxic, Alert & Oriented x 3 HEENT: Atraumatic, Normocephalic.  Ears and Nose: No external deformity. EXTR: No clubbing/cyanosis/edema NEURO: Normal gait.  PSYCH: Normally interactive. Conversant. Not depressed or anxious appearing.  Calm demeanor.  Left forearm: there is a long, open wound on the ventral left forearm, just distal to the elbow. No apparent deep structure damage.  There is subjective numbness on the forearm distal to the wound, but not in the hand.  Her hand has normal strength and sensation, normal wrist function.    Assessment and Plan: 1. Wound, open, arm, forearm    2. Pain, arm, left  traMADol (ULTRAM) 50 MG tablet   See note as per Rhoderick Moody, PA-C.  Wound repaired.  Tramadol as needed for pain.  WC as per pt instructions.  Let us know if any complications or sign of infection.    Abbe Amsterdam, MD

## 2012-03-14 NOTE — Patient Instructions (Addendum)

## 2012-03-14 NOTE — Progress Notes (Signed)
Patient ID: KYLE STANSELL MRN: [id number], DOB: 12/02/1970, 41 y.o. Date of Encounter: 03/14/2012, 11:48 AM   PROCEDURE NOTE: Verbal consent obtained. Sterile technique employed. Numbing: Anesthesia obtained with 2% lidocaine plain  Cleansed with soap and water. Irrigated.  Wound explored, no deep structures involved, no foreign bodies.   Wound repaired with # 10 HM and #1 SI sutures using 4-0 ethilon.  Hemostasis obtained. Wound cleansed and dressed.  Wound care instructions including precautions covered with patient. Handout given.  Anticipate suture removal in 7-10 days.    Rhoderick Moody, PA-C 03/14/2012 11:48 AM

## 2012-03-23 ENCOUNTER — Ambulatory Visit (INDEPENDENT_AMBULATORY_CARE_PROVIDER_SITE_OTHER): Payer: BC Managed Care – PPO | Admitting: Family Medicine

## 2012-03-23 VITALS — BP 112/64 | HR 68 | Temp 98.5°F | Resp 16 | Ht 67.0 in | Wt 146.0 lb

## 2012-03-23 DIAGNOSIS — Z4802 Encounter for removal of sutures: Secondary | ICD-10-CM

## 2012-03-23 NOTE — Progress Notes (Signed)
[facility] [address], [place] [address] [phone]  Date:  03/23/2012   Name:  [name] [name]   DOB:  01/31/1971   MRN:  [id number]  PCP:  No primary provider on file.    Chief Complaint: Suture / Staple Removal   History of Present Illness:  [name] [name] is a 41 y.o. very pleasant female patient who presents with the following:  Had a deep laeration to her left arm just distal to the antecubital fossa on the 5th of this month- she was cutting down some ivy with a knife and missed, accidentally cutting her arm.  She was seen here an the wound was sutured.  She is doing ok, but is concerned because her arm still hurts, she feels that she cannot fully extend her arm, and she has some numbness on the ventral forearm distal to her wound.    Patient Active Problem List  Diagnosis  . INFECTIOUS COLITIS ENTERITIS AND GASTROENTERITIS  . INTERNAL THROMBOSED HEMORRHOIDS  . CONSTIPATION  . IRRITABLE BOWEL SYNDROME    Past Medical History  Diagnosis Date  . IBS (irritable bowel syndrome)   . Internal thrombosed hemorrhoids   . Hiatal hernia   . History of campylobacteriosis     Past Surgical History  Procedure Date  . Breast enhancement surgery     History  Substance Use Topics  . Smoking status: Never Smoker   . Smokeless tobacco: Never Used  . Alcohol Use: No     occasionally    Family History  Problem Relation Age of Onset  . Irritable bowel syndrome Mother   . Ulcers Maternal Grandfather   . Colon cancer Neg Hx   . Leukemia Maternal Grandfather   . Hernia Mother     Allergies  Allergen Reactions  . Aspirin     REACTION: gi upset  . Latex   . Sulfonamide Derivatives     Medication list has been reviewed and updated.  Current Outpatient Prescriptions on File Prior to Visit  Medication Sig Dispense Refill  . ClonazePAM (KLONOPIN PO) Take 1 tablet by mouth daily.        Marland Kitchen esomeprazole (NEXIUM) 40 MG capsule Take 1  capsule (40 mg total) by mouth daily.  30 capsule  2  . traMADol (ULTRAM) 50 MG tablet Take 1 tablet (50 mg total) by mouth every 8 (eight) hours as needed for pain.  30 tablet  0  . valACYclovir (VALTREX) 1000 MG tablet       . hydrocortisone (ANUSOL-HC) 25 MG suppository Place 1 suppository (25 mg total) rectally at bedtime.  12 suppository  1  . Hyoscyamine-Phenyltoloxamine (DIGEX NF) [id number] MG CAPS Use as directed  12 each  0  . Hyoscyamine-Phenyltoloxamine (DIGEX NF) 9.6295-28 MG CAPS Take 1 capsule by mouth 4 (four) times daily -  before meals and at bedtime.  120 each  3  . metroNIDAZOLE (FLAGYL) 250 MG tablet Take one with food TID  21 tablet  0  . ondansetron (ZOFRAN) 4 MG tablet Take 1 tablet (4 mg total) by mouth every 8 (eight) hours as needed.  20 tablet  0    Review of Systems:  As per HPI- otherwise negative. No never.  Her hand has normal strength, sensation and function.   Physical Examination: Filed Vitals:   03/23/12 1334  BP: 112/64  Pulse: 68  Temp: 98.5 F (36.9 C)  Resp: 16   Filed Vitals:   03/23/12 1334  Height: 5\' 7"  (1.702 m)  Weight: 146 lb (66.225 kg)   Body mass index is 22.87 kg/(m^2). Ideal Body Weight: Weight in (lb) to have BMI = 25: 159.3    GEN: WDWN, NAD, Non-toxic, Alert & Oriented x 3 HEENT: Atraumatic, Normocephalic.  Ears and Nose: No external deformity. EXTR: No clubbing/cyanosis/edema NEURO: Normal gait.  PSYCH: Normally interactive. Conversant. Not depressed or anxious appearing.  Calm demeanor.  Left arm: on the ventral surface just distal to the antecubital fossa there is a healing wound- approx 5cm in length.  Wound has been closed with HM sutures.  She has numbness of the skin on her ventral forearm distal to the wound.  This area is also slightly sore an bruised.  She has normal sensation of her hand, and normal grip strength, finger ab/ adduction strength, and wrist strength in all direction.   She is able to fully extend  her elbow, as well as normally pronate and supinate.    Removed sutures- this released some of the tension on the wound and she felt she could extend her arm more easily.  No sign of infection  Assessment and Plan: 1. Visit for suture removal    [name] suffered a deep and long cut to her left arm.  It is not surprising that she has some residual numbness of her forearm, as some superficial nerve fibers wound have been cut.  Explained that we anticipate this will improve, but she may always have some numbness. However, her hand function and sensation is perfect.  The sutures had put some tension on the skin which may have contributed to the pulling that she noticed.  She will work on gentle ROM of her elbow- working on extension.  Suspect that now that her sutures are out she will feel more comfortable. She will call me if she is not satisfied with her progress in one week- sooner if any sign of infection or worsening of her symptoms.    Abbe Amsterdam, MD

## 2012-08-30 LAB — HM PAP SMEAR: HM Pap smear: NEGATIVE

## 2012-11-14 ENCOUNTER — Telehealth: Payer: Self-pay | Admitting: Obstetrics and Gynecology

## 2012-11-14 NOTE — Telephone Encounter (Signed)
Spoke with pt who thinks she has another yeast infection. Sched OV tomorrow with PG at 1245. aa

## 2012-11-14 NOTE — Telephone Encounter (Signed)
Pt has a yeast infection would an appointment

## 2012-11-15 ENCOUNTER — Ambulatory Visit (INDEPENDENT_AMBULATORY_CARE_PROVIDER_SITE_OTHER): Payer: BC Managed Care – PPO | Admitting: Nurse Practitioner

## 2012-11-15 ENCOUNTER — Encounter: Payer: Self-pay | Admitting: Nurse Practitioner

## 2012-11-15 VITALS — BP 110/76 | HR 68 | Wt 145.0 lb

## 2012-11-15 DIAGNOSIS — Z113 Encounter for screening for infections with a predominantly sexual mode of transmission: Secondary | ICD-10-CM

## 2012-11-15 DIAGNOSIS — B373 Candidiasis of vulva and vagina: Secondary | ICD-10-CM

## 2012-11-15 DIAGNOSIS — N912 Amenorrhea, unspecified: Secondary | ICD-10-CM

## 2012-11-15 DIAGNOSIS — B3731 Acute candidiasis of vulva and vagina: Secondary | ICD-10-CM

## 2012-11-15 LAB — POCT WET PREP (WET MOUNT)

## 2012-11-15 LAB — HCG, SERUM, QUALITATIVE: Preg, Serum: NEGATIVE

## 2012-11-15 LAB — POCT URINE PREGNANCY: Preg Test, Ur: NEGATIVE

## 2012-11-15 MED ORDER — FLUCONAZOLE 150 MG PO TABS: 150.0000 mg | ORAL_TABLET | Freq: Once | ORAL | Status: DC

## 2012-11-15 NOTE — Progress Notes (Signed)
Subjective:     Patient ID: [name] [name], female   DOB: [birth year], 42 y.o.   MRN: [id number]  HPIPatient off Lo Loestrin since Jan 2014. Then 2 cycles since then that lasted longer and was darker. She does have history of endometriosis. Some cramps but tolerable. LMP 09/23/12 and amenorrhea since. She has been on some type of birth control since age 58.  She is now in a new relationship since January. He has no history of Std's. Usually condoms for birth control or withdrawal.  She admits to a lot of stressors both with family and work.    Always with sexual activity she will get yeast vaginitis.  Current symptoms for 2 wk's. External irritation and vaginal discharge. Most of time white, occasionally clear. Denies UA symptoms.  Wants HIV done.    Review of Systems  Constitutional: Positive for unexpected weight change. Negative for fever, chills and fatigue.       Bloated and wt gain of 6 lbs in past week.  Not sure if PMS related.  Respiratory: Negative.   Cardiovascular: Negative.   Gastrointestinal: Negative.  Negative for nausea, abdominal pain, diarrhea and abdominal distention.  Genitourinary: Positive for vaginal discharge and menstrual problem. Negative for dysuria, urgency, frequency, flank pain, difficulty urinating and dyspareunia.  Psychiatric/Behavioral: Negative.        Objective:   Physical Exam  Constitutional: She is oriented to person, place, and time. She appears well-developed and well-nourished.  Cardiovascular: Normal rate.   Pulmonary/Chest: Effort normal.  Abdominal: Soft. She exhibits no distension. There is no tenderness. There is no rebound and no guarding.  Genitourinary:     White vaginal discharge. Irritation at the introitus consistent with sexual activity - without open lesion. No evidence of HSV or condyloma.(however very small spot seen with magnification could be a hair follicle) at the perianal area as noted.  Neurological: She is alert and  oriented to person, place, and time.  Skin: Skin is warm and dry.  Psychiatric: She has a normal mood and affect. Her behavior is normal.      UPT neg. Assessment:     Yeast Vaginitis acute and chronic Amenorrhea since 09/23/12     Plan:     Diflucan 150 mg today and repeat in 48 hrs. then weekly for 3 months - then recheck. (plan is to go Q O wk) Serum HCG and follow, if negative will give provera to start menses.

## 2012-11-15 NOTE — Patient Instructions (Signed)
Monilial Vaginitis Vaginitis in a soreness, swelling and redness (inflammation) of the vagina and vulva. Monilial vaginitis is not a sexually transmitted infection. CAUSES  Yeast vaginitis is caused by yeast (candida) that is normally found in your vagina. With a yeast infection, the candida has overgrown in number to a point that upsets the chemical balance. SYMPTOMS   White, thick vaginal discharge.  Swelling, itching, redness and irritation of the vagina and possibly the lips of the vagina (vulva).  Burning or painful urination.  Painful intercourse. DIAGNOSIS  Things that may contribute to monilial vaginitis are:  Postmenopausal and virginal states.  Pregnancy.  Infections.  Being tired, sick or stressed, especially if you had monilial vaginitis in the past.  Diabetes. Good control will help lower the chance.  Birth control pills.  Tight fitting garments.  Using bubble bath, feminine sprays, douches or deodorant tampons.  Taking certain medications that kill germs (antibiotics).  Sporadic recurrence can occur if you become ill. TREATMENT  Your caregiver will give you medication.  There are several kinds of anti monilial vaginal creams and suppositories specific for monilial vaginitis. For recurrent yeast infections, use a suppository or cream in the vagina 2 times a week, or as directed.  Anti-monilial or steroid cream for the itching or irritation of the vulva may also be used. Get your caregiver's permission.  Painting the vagina with methylene blue solution may help if the monilial cream does not work.  Eating yogurt may help prevent monilial vaginitis. HOME CARE INSTRUCTIONS   Finish all medication as prescribed.  Do not have sex until treatment is completed or after your caregiver tells you it is okay.  Take warm sitz baths.  Do not douche.  Do not use tampons, especially scented ones.  Wear cotton underwear.  Avoid tight pants and panty  hose.  Tell your sexual partner that you have a yeast infection. They should go to their caregiver if they have symptoms such as mild rash or itching.  Your sexual partner should be treated as well if your infection is difficult to eliminate.  Practice safer sex. Use condoms.  Some vaginal medications cause latex condoms to fail. Vaginal medications that harm condoms are:  Cleocin cream.  Butoconazole (Femstat).  Terconazole (Terazol) vaginal suppository.  Miconazole (Monistat) (may be purchased over the counter). SEEK MEDICAL CARE IF:   You have a temperature by mouth above 102 F (38.9 C).  The infection is getting worse after 2 days of treatment.  The infection is not getting better after 3 days of treatment.  You develop blisters in or around your vagina.  You develop vaginal bleeding, and it is not your menstrual period.  You have pain when you urinate.  You develop intestinal problems.  You have pain with sexual intercourse. Document Released: 05/06/2005 Document Revised: 10/19/2011 Document Reviewed: 01/18/2009 ExitCare Patient Information 2013 [employer].  

## 2012-11-16 LAB — HIV ANTIBODY (ROUTINE TESTING W REFLEX): HIV: NONREACTIVE

## 2012-11-16 NOTE — Progress Notes (Signed)
Encounter reviewed by Dr. Mayci Haning Silva.  

## 2012-11-21 ENCOUNTER — Telehealth: Payer: Self-pay | Admitting: Nurse Practitioner

## 2012-11-21 MED ORDER — DROSPIRENONE-ETHINYL ESTRADIOL 3-0.02 MG PO TABS: 1.0000 | ORAL_TABLET | Freq: Every day | ORAL | Status: DC

## 2012-11-21 NOTE — Telephone Encounter (Signed)
Spoke with pt who is having a really bad period with bad cramps like she used to have. Currently on a lower estrogen pill since about January and reports she has gained wt on it and feels she did better on a higher estrogen pill. Wondering if Patty would consider restarting Yaz or another pill that worked well for her in the past for cramps. Please advise.  aa

## 2012-11-21 NOTE — Telephone Encounter (Signed)
LM on pt's VM confirming name about restarting Yaz. To be called to [facility]. Pt to call back if any wt gain or problems. Pt can start pack today.  aa

## 2012-11-21 NOTE — Telephone Encounter (Signed)
This menses is heavier and crampier than usual secondary to no recent cycle and starting Provera.  But yes she can restart Yaz like she had in the past.  Hopefully no weight gain on this one.  Review start date - today is OK and potential side effects including DVT.

## 2012-12-21 ENCOUNTER — Telehealth: Payer: Self-pay | Admitting: Nurse Practitioner

## 2012-12-21 NOTE — Telephone Encounter (Signed)
Spoke with pt who has just restarted Yaz in April. Instructed pt to try another month on Yaz and see if BTB will cease. Instructed that sometimes it takes a few months on a pill to get body adjusted to change in hormones from previous pill. Pt agreeable and will call back if still having BTB next month.

## 2012-12-21 NOTE — Telephone Encounter (Signed)
Patient having break though bleeding while on Yaz. Wants to speak with nurse about changing rx's. [facility]

## 2012-12-21 NOTE — Telephone Encounter (Signed)
agree

## 2012-12-29 ENCOUNTER — Telehealth: Payer: Self-pay | Admitting: *Deleted

## 2012-12-29 NOTE — Telephone Encounter (Signed)
She can take Yasmin which is a little higher than Yaz but without bloating and wt gain.

## 2012-12-29 NOTE — Telephone Encounter (Signed)
Pt. Still having BTB after using yaz for 2 months now, Pt says her pharmacist suggested using a stronger pill. So pt is requesting a stronger pill but on the same line as yaz.

## 2012-12-29 NOTE — Telephone Encounter (Signed)
Patient wants s a stronger birth control pill/method; would like to speak with nurse in regards.

## 2012-12-30 ENCOUNTER — Other Ambulatory Visit: Payer: Self-pay | Admitting: Orthopedic Surgery

## 2012-12-30 MED ORDER — DROSPIRENONE-ETHINYL ESTRADIOL 3-0.03 MG PO TABS: 1.0000 | ORAL_TABLET | Freq: Every day | ORAL | Status: DC

## 2012-12-30 NOTE — Telephone Encounter (Signed)
LM on pt's VM confirming name that a new Rx will be sent to her pharmacy. Call back if any problems.

## 2013-01-20 ENCOUNTER — Telehealth: Payer: Self-pay | Admitting: Nurse Practitioner

## 2013-01-20 NOTE — Telephone Encounter (Signed)
Patient is not happy with current BCP, Yasmin, . States is still with cramp, spotting, has gained 7 lbs. Despite diet and exorcize. Appointment given for Monday with Shirlyn Goltz 12:45pm. Has been on LoLestrin, Dianah Field, Yasmin changes since 11/2012.

## 2013-01-20 NOTE — Telephone Encounter (Signed)
Patient is not happy with [name]

## 2013-01-23 ENCOUNTER — Encounter: Payer: Self-pay | Admitting: Nurse Practitioner

## 2013-01-23 ENCOUNTER — Ambulatory Visit (INDEPENDENT_AMBULATORY_CARE_PROVIDER_SITE_OTHER): Payer: BC Managed Care – PPO | Admitting: Nurse Practitioner

## 2013-01-23 VITALS — BP 128/70 | HR 82 | Resp 14 | Ht 67.0 in | Wt 149.8 lb

## 2013-01-23 DIAGNOSIS — A64 Unspecified sexually transmitted disease: Secondary | ICD-10-CM

## 2013-01-23 DIAGNOSIS — R635 Abnormal weight gain: Secondary | ICD-10-CM

## 2013-01-23 DIAGNOSIS — R350 Frequency of micturition: Secondary | ICD-10-CM

## 2013-01-23 DIAGNOSIS — N938 Other specified abnormal uterine and vaginal bleeding: Secondary | ICD-10-CM

## 2013-01-23 DIAGNOSIS — N925 Other specified irregular menstruation: Secondary | ICD-10-CM

## 2013-01-23 DIAGNOSIS — N949 Unspecified condition associated with female genital organs and menstrual cycle: Secondary | ICD-10-CM

## 2013-01-23 LAB — POCT URINALYSIS DIPSTICK
Leukocytes, UA: NEGATIVE
Spec Grav, UA: 1.02
Urobilinogen, UA: NEGATIVE
pH, UA: 6

## 2013-01-23 LAB — COMPREHENSIVE METABOLIC PANEL
ALT: 17 U/L (ref 0–35)
AST: 23 U/L (ref 0–37)
Albumin: 4 g/dL (ref 3.5–5.2)
Alkaline Phosphatase: 38 U/L — ABNORMAL LOW (ref 39–117)
BUN: 10 mg/dL (ref 6–23)
CO2: 23 mEq/L (ref 19–32)
Calcium: 8.6 mg/dL (ref 8.4–10.5)
Chloride: 105 mEq/L (ref 96–112)
Creat: 0.71 mg/dL (ref 0.50–1.10)
Glucose, Bld: 99 mg/dL (ref 70–99)
Potassium: 4.4 mEq/L (ref 3.5–5.3)
Sodium: 137 mEq/L (ref 135–145)
Total Bilirubin: 0.5 mg/dL (ref 0.3–1.2)
Total Protein: 6.8 g/dL (ref 6.0–8.3)

## 2013-01-23 LAB — THYROID PANEL WITH TSH
Free Thyroxine Index: 2 (ref 1.0–3.9)
T3 Uptake: 23.2 % (ref 22.5–37.0)
T4, Total: 8.6 ug/dL (ref 5.0–12.5)
TSH: 2.202 u[IU]/mL (ref 0.350–4.500)

## 2013-01-23 LAB — HCG, SERUM, QUALITATIVE: Preg, Serum: NEGATIVE

## 2013-01-23 NOTE — Progress Notes (Signed)
Subjective:     Patient ID: [name] [name], female   DOB: 1971-05-22, 42 y.o.   MRN: [id number]  HPI 42 yo Fe presents with AUB on OCP.  Currently on Yasmin.  Previously has been on Lo Loestrin, Yaz, Yasmin. She is having BTB not related to missed or late or late pills.  but symptoms do occur more late cycle.  She is concerned that endometriosis is causing this. She has other symptoms of lower pelvic pain that is fleeting and last from a few seconds to a minute a few times per week. Her last PUS was 2005.  Other symptoms of fatigue, irritability that ended her last relationship. Urinary frequency without dysuria. Nocturia 1 time at night. Not sleeping well but this long term problem. Has gained 5 lbs this past week. She is concerned about STD as cause of pain. Also concerned that she may be pregnant because of hormonal changes. Wants to get labs checked.   Will check TSH, thyroid panel, CMP, HCG serum This is off week of OCP and currently  Review of Systems  Constitutional: Positive for appetite change, fatigue and unexpected weight change.  Respiratory: Negative for choking, chest tightness and shortness of breath.   Cardiovascular: Negative for chest pain, palpitations and leg swelling.  Gastrointestinal: Positive for nausea and abdominal pain. Negative for vomiting, diarrhea, constipation and blood in stool.  Endocrine: Positive for polyuria.  Genitourinary: Positive for urgency, frequency, vaginal bleeding, vaginal pain, menstrual problem and pelvic pain. Negative for dysuria, hematuria, flank pain and genital sores.  Musculoskeletal: Negative for myalgias, back pain, joint swelling, arthralgias and gait problem.  Neurological: Positive for dizziness and weakness. Negative for tremors, syncope, speech difficulty and numbness.  Psychiatric/Behavioral: Positive for behavioral problems, sleep disturbance, dysphoric mood, decreased concentration and agitation. Negative for suicidal ideas and  self-injury. The patient is nervous/anxious. The patient is not hyperactive.        Objective:   Physical Exam  Constitutional: She is oriented to person, place, and time. She appears well-developed and well-nourished.  Cardiovascular: Normal rate and regular rhythm.   Pulmonary/Chest: Effort normal and breath sounds normal.  Abdominal: Soft. She exhibits no distension and no mass. There is no tenderness. There is no rebound and no guarding.  Genitourinary:  Pelvic with moderate vaginal bleeding, that is dark brown.  No adnexal mass.  Cervical os is closed.  Neurological: She is alert and oriented to person, place, and time.  Psychiatric: She has a normal mood and affect. Her behavior is normal. Judgment and thought content normal.  She is worried and anxious about her symptoms.       Assessment:     History of endometriosis BTB on OCP Increase in emotional and physical symptoms R/O STD, and pregnancy Weight Gain of 5 lbs in 1 week R/O UTI    Plan:     Will get TSH, and thyroid panel Will get CMP and check glucose Will check urine culture Will check serum HCG Continue with this OCP in the interim

## 2013-01-23 NOTE — Patient Instructions (Signed)
Given instructions verbally

## 2013-01-24 ENCOUNTER — Telehealth: Payer: Self-pay | Admitting: *Deleted

## 2013-01-24 LAB — URINE CULTURE
Colony Count: NO GROWTH
Organism ID, Bacteria: NO GROWTH

## 2013-01-24 NOTE — Telephone Encounter (Signed)
Pt is aware of all lab results.

## 2013-01-24 NOTE — Telephone Encounter (Signed)
Message copied by Osie Bond on Tue Jan 24, 2013  9:06 AM ------      Message from: Roanna Banning      Created: Tue Jan 24, 2013  8:32 AM       Let Lawson Fiscal know labs that are back so far.  She will be quite anxious to hear about pregnancy test.  Her CMP and Thyroid panel and TSH is all normal.( I have sent note to Dr. Edward Jolly about the PUS, have not heard about what she thinks.) Gc / Chlamydia are still pending. ------

## 2013-01-24 NOTE — Telephone Encounter (Signed)
LVM for pt to return my call in regards to lab results.  

## 2013-01-24 NOTE — Telephone Encounter (Signed)
Message copied by Osie Bond on Tue Jan 24, 2013 10:05 AM ------      Message from: Roanna Banning      Created: Tue Jan 24, 2013  8:43 AM       Let patient know these results. She will be quite anxious to hear about pregnancy test results.  The TSH and thyroid panel is  all normal along with CMP. (Still waiting to hear from Dr. Edward Jolly about her recommendations for PUS). ------

## 2013-01-25 LAB — IPS N GONORRHOEA AND CHLAMYDIA BY PCR

## 2013-01-27 NOTE — Progress Notes (Signed)
Encounter reviewed.  I recommend proceeding with pelvic ultrasound.  Order placed in Epic.

## 2013-01-27 NOTE — Progress Notes (Signed)
I recommend proceeding with pelvic ultrasound.  Please inform patient.  Order has been placed.

## 2013-01-29 ENCOUNTER — Other Ambulatory Visit: Payer: Self-pay | Admitting: Obstetrics & Gynecology

## 2013-01-29 DIAGNOSIS — N939 Abnormal uterine and vaginal bleeding, unspecified: Secondary | ICD-10-CM

## 2013-01-31 ENCOUNTER — Telehealth: Payer: Self-pay | Admitting: *Deleted

## 2013-01-31 NOTE — Progress Notes (Signed)
Tiffany does patient know about PUS?

## 2013-01-31 NOTE — Telephone Encounter (Signed)
Message copied by Osie Bond on Tue Jan 31, 2013  9:23 AM ------      Message from: Ria Comment R      Created: Mon Jan 30, 2013  7:53 AM       Let patient know GC / Chl results. ------

## 2013-01-31 NOTE — Telephone Encounter (Signed)
Pt is aware of lab results.

## 2013-02-15 ENCOUNTER — Ambulatory Visit (INDEPENDENT_AMBULATORY_CARE_PROVIDER_SITE_OTHER): Payer: BC Managed Care – PPO | Admitting: Obstetrics & Gynecology

## 2013-02-15 ENCOUNTER — Ambulatory Visit (INDEPENDENT_AMBULATORY_CARE_PROVIDER_SITE_OTHER): Payer: BC Managed Care – PPO

## 2013-02-15 DIAGNOSIS — N939 Abnormal uterine and vaginal bleeding, unspecified: Secondary | ICD-10-CM

## 2013-02-15 DIAGNOSIS — N926 Irregular menstruation, unspecified: Secondary | ICD-10-CM

## 2013-02-15 DIAGNOSIS — N923 Ovulation bleeding: Secondary | ICD-10-CM

## 2013-02-15 DIAGNOSIS — N921 Excessive and frequent menstruation with irregular cycle: Secondary | ICD-10-CM

## 2013-02-15 NOTE — Progress Notes (Signed)
Patient left before being seen as I was delayed in the OR.  Images have been reviewed.   Uterus 8.2 x 4.8 x 3.9cm Endometrium 7.65mm Left ovary 3.3 x 1.8 x 1.4cm Right ovary 3.6 x 2.3cm with 1.5cm corpus luteal cyst Cul de sac neg.  Left message for patient to call.  No reason on ultrasound for intermenstrual bleeding/spotting

## 2013-02-16 ENCOUNTER — Encounter: Payer: Self-pay | Admitting: Obstetrics & Gynecology

## 2013-02-16 NOTE — Patient Instructions (Signed)
Results will be called.

## 2013-02-21 ENCOUNTER — Telehealth: Payer: Self-pay | Admitting: Obstetrics & Gynecology

## 2013-02-21 NOTE — Telephone Encounter (Signed)
Pt is not sure what dr Hyacinth Meeker wanted to discuss with her. Says she was told by dr Hyacinth Meeker that she would be on vacation and pt needed to speak with you. Pt is upset that know one seems to know what dr Hyacinth Meeker wanted to discuss with her.

## 2013-02-21 NOTE — Telephone Encounter (Signed)
Attempt to call patient and LM on cell and work numbers as well as cell number that work VM list [phone].  All three numbers have VM that confirm at least patients first name.  LM that everything was fine and to call back to review.

## 2013-02-23 NOTE — Telephone Encounter (Signed)
Rec:  OV for consult with Dr. Hyacinth Meeker

## 2013-02-23 NOTE — Telephone Encounter (Signed)
Patient notified of dr Rondel Baton review of ultrasound and no cause for BTB found.  Patient asking about next step.  When off OCP has menses Q2 weeks and on OCP has BTB daily.  She would really like OCP that would stop BTB and not cause weight gain.  She asked about lab values for "perimenopause". Explained Thyroid labs and CMP were all normal (HCG neg) but no specific testing for menopause at her gae in absence of symptoms.  Patient reports she has had issues with mood changes, hot flashes and night sweats.  What would be next step?    This patient would have had a consult visit on day of ultrasound but MD was delayed in surgery and patient had to leave before MD returned.

## 2013-02-24 NOTE — Telephone Encounter (Signed)
Patient notified needs OV to discuss and evaluate all of her symptoms and issues and make treatment plan.  [place] is on the highway so will call back to schedule.

## 2013-03-14 ENCOUNTER — Telehealth: Payer: Self-pay | Admitting: Obstetrics & Gynecology

## 2013-03-14 ENCOUNTER — Encounter: Payer: Self-pay | Admitting: Certified Nurse Midwife

## 2013-03-14 ENCOUNTER — Ambulatory Visit (INDEPENDENT_AMBULATORY_CARE_PROVIDER_SITE_OTHER): Payer: BC Managed Care – PPO | Admitting: Certified Nurse Midwife

## 2013-03-14 VITALS — BP 104/64 | HR 64 | Resp 16 | Ht 67.0 in | Wt 147.0 lb

## 2013-03-14 DIAGNOSIS — N760 Acute vaginitis: Secondary | ICD-10-CM

## 2013-03-14 MED ORDER — METRONIDAZOLE 0.75 % VA GEL: 1.0000 | Freq: Every day | VAGINAL | Status: DC

## 2013-03-14 NOTE — Progress Notes (Signed)
42 y.o.SingleCaucasian female G0P0 with a 2 week(s) history of the following:discharge described as clear and malodorous, odor and vulvar itching Sexually active: yes Last sexual activity:4days ago. Pt also reports the following associated symptoms: none Patient has not tried over the counter treatment. No STD concerns. No new personal products. Contraception: condoms  O: Healthy female WDWN Affect: normal, orientation x 3    Exam:  ZOX:[id number], Bartholin's, Urethra, Skene's normal                Vag:no lesions, discharge: clear, watery and malodorous, pH 5.0, wet prep done                Cx:  normal appearance and non tender                Uterus:normal size, non-tender, normal shape and consistency                Adnexa: normal adnexa and no mass, fullness, tenderness  Wet Prep shows:clue cells Positive for BV, negative for yeast and Trich  A:BV  P: Reviewed findings.  Rx Metrogel see order  Aveeno sitz bath prn comfort

## 2013-03-14 NOTE — Telephone Encounter (Signed)
Spoke with pt who has been having abnormal DC and itching for about 2 weeks. Discharge is clear with an odor sometimes. Pt took a test from the drugstore that showed her vaginal pH is "off", so pt tried a pH wash which seemed to make it worse. Advised pt it would be best to be seen. Scheduled OV with DL today at 9:60.

## 2013-03-14 NOTE — Telephone Encounter (Signed)
Pt thinks she may have a bacterial infection. Would like nurse to call her.

## 2013-03-16 NOTE — Progress Notes (Signed)
Note reviewed, agree with plan.  Tamari Redwine, MD  

## 2013-06-19 ENCOUNTER — Telehealth: Payer: Self-pay | Admitting: Nurse Practitioner

## 2013-06-19 NOTE — Telephone Encounter (Signed)
Spoke with pt about discharge and itching. Not sure if it is yeast or BV. No odor. Discharge is clear to cloudy looking. Pt says, "Every time I go to the beach or get in a hot tub this happens." Sched OV with PG tomorrow at 12:45.

## 2013-06-19 NOTE — Telephone Encounter (Signed)
Patient calling wanting to speak with the nurse to schedule an appointment with Patty about "some problems" she is having. Patient declined to give additional information.

## 2013-06-20 ENCOUNTER — Ambulatory Visit (INDEPENDENT_AMBULATORY_CARE_PROVIDER_SITE_OTHER): Payer: BC Managed Care – PPO | Admitting: Nurse Practitioner

## 2013-06-20 ENCOUNTER — Encounter: Payer: Self-pay | Admitting: Nurse Practitioner

## 2013-06-20 VITALS — BP 120/76 | HR 92 | Ht 67.0 in | Wt 145.0 lb

## 2013-06-20 DIAGNOSIS — B9689 Other specified bacterial agents as the cause of diseases classified elsewhere: Secondary | ICD-10-CM

## 2013-06-20 DIAGNOSIS — A499 Bacterial infection, unspecified: Secondary | ICD-10-CM

## 2013-06-20 DIAGNOSIS — Z113 Encounter for screening for infections with a predominantly sexual mode of transmission: Secondary | ICD-10-CM

## 2013-06-20 DIAGNOSIS — N760 Acute vaginitis: Secondary | ICD-10-CM

## 2013-06-20 MED ORDER — METRONIDAZOLE 0.75 % VA GEL: 1.0000 | Freq: Every day | VAGINAL | Status: DC

## 2013-06-20 MED ORDER — FLUCONAZOLE 150 MG PO TABS: 150.0000 mg | ORAL_TABLET | Freq: Once | ORAL | Status: DC

## 2013-06-20 NOTE — Progress Notes (Signed)
Subjective:     Patient ID: [name] [name], female   DOB: Apr 19, 1971, 42 y.o.   MRN: [id number]  Vaginal Discharge The patient's primary symptoms include a vaginal discharge. Associated symptoms include diarrhea and nausea. Pertinent negatives include no abdominal pain, chills, constipation, fever or vomiting.    42 yo WS Fe with symptoms of vaginal discharge for 2-3 weeks. Some itching and increased discharge.  Clear initially and now more cloudy white in color.  Then area at previous site of condyloma was irritated and she was concerned about another wart.  She also thought she had a flare of HSV and took extra Valtrex.  She has also self trated with Diflucan X 1 wtthout any real change in symptoms.   She has now ended relationship with this partner.  He seemed to be more distant and did not want to involve her in his life.  At Halloween got back with ex boyfriend.  They were in a hot tub, had SA without use of condoms. Because of recent emotional changes she also has had flares with IBS.   Review of Systems  Constitutional: Negative for fever, chills, fatigue and unexpected weight change.  Respiratory: Negative.   Cardiovascular: Negative.   Gastrointestinal: Positive for nausea and diarrhea. Negative for vomiting, abdominal pain, constipation, blood in stool, abdominal distention and anal bleeding.  Genitourinary: Positive for vaginal discharge.       LMP 05/29/13.  Off OCP and using condoms for birth control most times.     Musculoskeletal: Negative.   Skin: Negative.   Neurological: Negative.   Psychiatric/Behavioral: Positive for dysphoric mood. The patient is nervous/anxious.        Objective:   Physical Exam  Constitutional: She appears well-developed and well-nourished. No distress.  Abdominal: Soft. She exhibits no distension. There is no tenderness. There is no rebound and no guarding.  Genitourinary:  Area at former condyloma is clear of any recurrence.  She has no  lesions that is related to flare of HSV. Vaginal discharge is thin white.  No cervicitis.  No pain on bimanual exam. She request STD testing.  Wet Prep:  PH: 5.0; NSS: + clue cells; KOH: + for 2 yeast only.       Assessment:     BV History of recurrent yeast R/O STD    Plan:     Metrogel vaginal cream HS X 5 refill X 1 Then Diflucan 150 mg # 2 / refill X 1 Will call with STD results.

## 2013-06-20 NOTE — Progress Notes (Signed)
Encounter reviewed by Dr. Brook Silva.  

## 2013-06-20 NOTE — Patient Instructions (Signed)
Bacterial Vaginosis Bacterial vaginosis (BV) is a vaginal infection where the normal balance of bacteria in the vagina is disrupted. The normal balance is then replaced by an overgrowth of certain bacteria. There are several different kinds of bacteria that can cause BV. BV is the most common vaginal infection in women of childbearing age. CAUSES   The cause of BV is not fully understood. BV develops when there is an increase or imbalance of harmful bacteria.  Some activities or behaviors can upset the normal balance of bacteria in the vagina and put women at increased risk including:  Having a new sex partner or multiple sex partners.  Douching.  Using an intrauterine device (IUD) for contraception.  It is not clear what role sexual activity plays in the development of BV. However, women that have never had sexual intercourse are rarely infected with BV. Women do not get BV from toilet seats, bedding, swimming pools or from touching objects around them.  SYMPTOMS   Grey vaginal discharge.  A fish-like odor with discharge, especially after sexual intercourse.  Itching or burning of the vagina and vulva.  Burning or pain with urination.  Some women have no signs or symptoms at all. DIAGNOSIS  Your caregiver must examine the vagina for signs of BV. Your caregiver will perform lab tests and look at the sample of vaginal fluid through a microscope. They will look for bacteria and abnormal cells (clue cells), a pH test higher than 4.5, and a positive amine test all associated with BV.  RISKS AND COMPLICATIONS   Pelvic inflammatory disease (PID).  Infections following gynecology surgery.  Developing HIV.  Developing herpes virus. TREATMENT  Sometimes BV will clear up without treatment. However, all women with symptoms of BV should be treated to avoid complications, especially if gynecology surgery is planned. Female partners generally do not need to be treated. However, BV may spread  between female sex partners so treatment is helpful in preventing a recurrence of BV.   BV may be treated with antibiotics. The antibiotics come in either pill or vaginal cream forms. Either can be used with nonpregnant or pregnant women, but the recommended dosages differ. These antibiotics are not harmful to the baby.  BV can recur after treatment. If this happens, a second round of antibiotics will often be prescribed.  Treatment is important for pregnant women. If not treated, BV can cause a premature delivery, especially for a pregnant woman who had a premature birth in the past. All pregnant women who have symptoms of BV should be checked and treated.  For chronic reoccurrence of BV, treatment with a type of prescribed gel vaginally twice a week is helpful. HOME CARE INSTRUCTIONS   Finish all medication as directed by your caregiver.  Do not have sex until treatment is completed.  Tell your sexual partner that you have a vaginal infection. They should see their caregiver and be treated if they have problems, such as a mild rash or itching.  Practice safe sex. Use condoms. Only have 1 sex partner. PREVENTION  Basic prevention steps can help reduce the risk of upsetting the natural balance of bacteria in the vagina and developing BV:  Do not have sexual intercourse (be abstinent).  Do not douche.  Use all of the medicine prescribed for treatment of BV, even if the signs and symptoms go away.  Tell your sex partner if you have BV. That way, they can be treated, if needed, to prevent reoccurrence. SEEK MEDICAL CARE IF:     Your symptoms are not improving after 3 days of treatment.  You have increased discharge, pain, or fever. MAKE SURE YOU:   Understand these instructions.  Will watch your condition.  Will get help right away if you are not doing well or get worse. FOR MORE INFORMATION  Division of STD Prevention (DSTDP), Centers for Disease Control and Prevention:  www.cdc.gov/std American Social Health Association (ASHA): www.ashastd.org  Document Released: 07/27/2005 Document Revised: 10/19/2011 Document Reviewed: 03/08/2013 ExitCare Patient Information 2014 [employer].  

## 2013-06-21 LAB — STD PANEL
HIV: NONREACTIVE
Hepatitis B Surface Ag: NEGATIVE

## 2013-06-22 LAB — IPS N GONORRHOEA AND CHLAMYDIA BY PCR

## 2013-07-09 ENCOUNTER — Other Ambulatory Visit: Payer: Self-pay | Admitting: Nurse Practitioner

## 2013-07-10 MED ORDER — FLUCONAZOLE 150 MG PO TABS: 150.0000 mg | ORAL_TABLET | Freq: Once | ORAL | Status: DC

## 2013-07-10 NOTE — Telephone Encounter (Signed)
AEX was 08/30/12 according to paper chart patient had rf of valtrex already.  No current AEX scheduled just yet.  Okay to refill?

## 2013-07-10 NOTE — Telephone Encounter (Signed)
Patient is also wanting a refill of Diflucan

## 2013-07-10 NOTE — Telephone Encounter (Signed)
Patient is requesting a refill of Diflucan. [facility], [place]

## 2013-08-08 ENCOUNTER — Telehealth: Payer: Self-pay | Admitting: Nurse Practitioner

## 2013-08-08 NOTE — Telephone Encounter (Signed)
07/10/13 # 2 pils with 1 refill was sent to pharmacy S/w pharmacist patient picked up her rx 07/10/13 she does have a additional refill remaining. The rx will be ready in a hour or 2  Called patient notified her of this she said she was there over the weekend and they said she didn't have any. Reassured her that I spoke with pharmacist to confirm that she does have 1 refill left, patient aware.

## 2013-08-08 NOTE — Telephone Encounter (Signed)
Patient says at her last office visit she was supposed to get refills of Diflucan. [facility] [place], [place]

## 2013-08-30 ENCOUNTER — Other Ambulatory Visit: Payer: Self-pay | Admitting: Nurse Practitioner

## 2013-08-30 NOTE — Telephone Encounter (Signed)
Last Refilled: 07/10/13 #2/1 refills Last AEX: 08/30/12 (Patient has been seen through [facility] recently)  S/w patient she is completely out is needing refills she is aware that Ms.Chong Sicilian is out of the office and is okay to wait until Ms.Patty comes in. Says she is having the classic symptoms, vag itching, whitish cottage cheese d/c x 1 week  Please advise.

## 2013-08-30 NOTE — Telephone Encounter (Signed)
Patient is asking for a refill of  fluconazole (DIFLUCAN) 150 MG tablet  Take 1 tablet (150 mg total) by mouth once. Take one tablet. Repeat in 48 hours if symptoms are not completely resolved., Starting 07/10/2013, Normal, Last Dose: Not Recorded  Refills: 1 ordered [facility]acy: [id number] [place], [place] Said patty told her she was going to give her a ongoing rx for it.

## 2013-08-31 MED ORDER — FLUCONAZOLE 150 MG PO TABS
150.0000 mg | ORAL_TABLET | Freq: Once | ORAL | Status: DC
Start: 2013-08-30 — End: 2013-09-26

## 2013-08-31 NOTE — Telephone Encounter (Signed)
LM on patient's VM that rx has been sent. 

## 2013-09-04 ENCOUNTER — Other Ambulatory Visit: Payer: Self-pay | Admitting: Nurse Practitioner

## 2013-09-05 NOTE — Telephone Encounter (Signed)
AEX was 08/30/12 . Last refill 07/09/2013 #30/1refills.    No current AEX scheduled yet.  Okay to refill?

## 2013-09-26 ENCOUNTER — Ambulatory Visit (INDEPENDENT_AMBULATORY_CARE_PROVIDER_SITE_OTHER): Payer: BC Managed Care – PPO | Admitting: Nurse Practitioner

## 2013-09-26 ENCOUNTER — Encounter: Payer: Self-pay | Admitting: Nurse Practitioner

## 2013-09-26 ENCOUNTER — Telehealth: Payer: Self-pay | Admitting: *Deleted

## 2013-09-26 VITALS — BP 136/92 | HR 96 | Ht 67.0 in | Wt 148.0 lb

## 2013-09-26 DIAGNOSIS — Z113 Encounter for screening for infections with a predominantly sexual mode of transmission: Secondary | ICD-10-CM

## 2013-09-26 DIAGNOSIS — N939 Abnormal uterine and vaginal bleeding, unspecified: Secondary | ICD-10-CM

## 2013-09-26 DIAGNOSIS — N926 Irregular menstruation, unspecified: Secondary | ICD-10-CM

## 2013-09-26 LAB — POCT URINE PREGNANCY: Preg Test, Ur: NEGATIVE

## 2013-09-26 MED ORDER — MEDROXYPROGESTERONE ACETATE 10 MG PO TABS: 10.0000 mg | ORAL_TABLET | Freq: Every day | ORAL | Status: DC

## 2013-09-26 NOTE — Progress Notes (Signed)
Subjective:     Patient ID: [name] [name], female   DOB: [birth year], 43 y.o.   MRN: [id number]  HPI  This 43 yo WSF presents with 2 problems: vaginal tear? and vaginal bleeding.  Problem # 2  She thought she may have a vaginal tear or HSV outbreak.  About 2/11 and 2/13 had SA with "old" partner who is large and she felt like she was irritated.  She started back on Valtrex BID.  Then SA again on 2/14 but not as tender. She describes a rough area like a burn on the inside of left vulva that has not been there before. Problem # 1 Her LMP was on 1/25 then a week later on 2/6 had another episode of bleeding like a normal cycle for 5 days. Around this same time she stopped OCP.   Feb 14 spotting again and lasting until present.  This partner has had a vasectomy.  The other partner relationship not yet ended and really uncertain as to whether she is going to end.  Review of Systems  Constitutional: Negative for fever, chills and fatigue.  Respiratory: Negative.   Cardiovascular: Negative.   Gastrointestinal: Negative.  Negative for vomiting, abdominal pain, diarrhea, blood in stool and abdominal distention.  Genitourinary: Positive for genital sores, vaginal pain, menstrual problem and dyspareunia. Negative for dysuria, frequency, hematuria, flank pain, enuresis, difficulty urinating and pelvic pain.  Musculoskeletal: Negative.   Skin: Negative.   Psychiatric/Behavioral:       Insomnia and increase in anxiety related to her 2 relationships.       Objective:   Physical Exam  Constitutional: She appears well-developed and well-nourished. No distress.  Abdominal: Soft. She exhibits no distension and no mass. There is no tenderness. There is no guarding.  Genitourinary:  Left introitus there is an area that could be healing of HSV or tear.  Area is a light whitish in color with minimal redness around it.  Moderate vaginal bleeding. No cervicitis.    No pain on bimanual.  GC/Chl smear is obtained.  UPT is negative.       Assessment:     Problem # 1 AUB from stopping OCP at mid cycle with continued bleeding   OCP with problems of bloating and weight gain Problem #2   Healing HSV vs. tear    Plan:     Will give her Provera 10 mg daily X 7 and this will coincide with onset of next menses when she completes the pill - then have a normal withdrawal bleed and should go back to normal afterwards She will continue with Valtrex for a few more days and uses extra lubrication with SA Will follow with GC/ Chl results Try to make personal decisions about relationships so that she is not stressed.

## 2013-09-26 NOTE — Patient Instructions (Signed)
Take Provera 10 mg daily X 7 then will have a normal period.  After this next menses bleeding should be gone.

## 2013-09-26 NOTE — Progress Notes (Signed)
Encounter reviewed by Dr. Brook Silva.  

## 2013-09-26 NOTE — Telephone Encounter (Signed)
Patient reports problem with prolonged bleeding since went off OCP.  She restarted OCP over the holidays and then stopped them again on her own in the middle of the package.  Reports light bleeding since stopping pills. Advised this is very common and will need to wait one full cycle to give herself time to readjust.  Patient also concerned about possible vaginal "tear". States has a possible sore in vaginal area but is not like her usual herpes outbreak. States her boyfriend is rather large and she has been quite active recently due to Valentine's Day.  OV scheduled for this afternoon. Requested appt with Patty.  Routing to provider for final review. Patient agreeable to disposition. Will close encounter

## 2013-09-29 LAB — IPS N GONORRHOEA AND CHLAMYDIA BY PCR

## 2013-10-02 ENCOUNTER — Telehealth: Payer: Self-pay | Admitting: *Deleted

## 2013-10-02 NOTE — Telephone Encounter (Signed)
I have attempted to contact this patient by phone with the following results: left message to return my call on answering machine (home/mobile).  

## 2013-10-02 NOTE — Telephone Encounter (Signed)
Message copied by Graylon Good on Mon Oct 02, 2013  9:43 AM ------      Message from: Kem Boroughs R      Created: Fri Sep 29, 2013  9:29 AM       Let patient know test is negative ------

## 2013-10-12 ENCOUNTER — Other Ambulatory Visit: Payer: Self-pay | Admitting: Nurse Practitioner

## 2013-10-12 NOTE — Telephone Encounter (Signed)
AEX was 08/30/12 Last refill 08/31/13 #2/1 refill No future appt scheduled.   Please approve or deny rx.

## 2013-10-12 NOTE — Telephone Encounter (Signed)
Pt reviewed labs in [place].

## 2013-11-23 ENCOUNTER — Other Ambulatory Visit: Payer: Self-pay | Admitting: Physician Assistant

## 2013-12-29 ENCOUNTER — Encounter: Payer: Self-pay | Admitting: Nurse Practitioner

## 2013-12-29 ENCOUNTER — Ambulatory Visit (INDEPENDENT_AMBULATORY_CARE_PROVIDER_SITE_OTHER): Payer: BC Managed Care – PPO | Admitting: Nurse Practitioner

## 2013-12-29 VITALS — BP 120/82 | HR 88 | Ht 67.0 in | Wt 146.0 lb

## 2013-12-29 DIAGNOSIS — Z0000 Encounter for general adult medical examination without abnormal findings: Secondary | ICD-10-CM

## 2013-12-29 DIAGNOSIS — Z01419 Encounter for gynecological examination (general) (routine) without abnormal findings: Secondary | ICD-10-CM

## 2013-12-29 LAB — HEMOGLOBIN, FINGERSTICK: Hemoglobin, fingerstick: 14 g/dL (ref 12.0–16.0)

## 2013-12-29 LAB — POCT URINALYSIS DIPSTICK
Bilirubin, UA: NEGATIVE
Blood, UA: NEGATIVE
Glucose, UA: NEGATIVE
Ketones, UA: NEGATIVE
Leukocytes, UA: NEGATIVE
Nitrite, UA: NEGATIVE
Protein, UA: NEGATIVE
Urobilinogen, UA: NEGATIVE
pH, UA: 6

## 2013-12-29 NOTE — Progress Notes (Signed)
Patient ID: [name] [name], female   DOB: [birth year], 43 y.o.   MRN: [id number] 43 y.o. G0P0 Single Caucasian Fe here for annual exam. She went off fOCP in January secondary to weight gain.  Then post pill amenorrhea and took Provera in February and had a normal cycle in March, April, and May.  Patient's last menstrual period was 12/21/2013.          Sexually active: yes  The current method of family planning is condoms and rhythm sometimes.    Exercising: yes  running and weights Smoker:  no  Health Maintenance: Pap:  08/30/12, WNL, neg HR HPV MMG:  never Colonoscopy:  2010, normal BMD:   2011, heel, normal TDaP:  2009 Labs: HB:  14.0  Urine:  Negative    reports that she has never smoked. She has never used smokeless tobacco. She reports that she does not drink alcohol or use illicit drugs.  Past Medical History  Diagnosis Date  . IBS (irritable bowel syndrome)   . Internal thrombosed hemorrhoids   . Hiatal hernia   . History of campylobacteriosis   . Migraine     without aura  . Condyloma acuminata 2001  . Endometriosis 8/95    DepoLupron 4/96-10/96  . Abnormal Pap smear, atypical squamous cells of undetermined sign (ASC-US) 10/98    CIN 1  . HSV-2 infection     Past Surgical History  Procedure Laterality Date  . Breast enhancement surgery    . Exploratory laparotomy  8/95    endometriosis  . Breast mass excision Right 12/01    fibroadenoma    Current Outpatient Prescriptions  Medication Sig Dispense Refill  . ClonazePAM (KLONOPIN PO) Take 1 tablet by mouth daily.        Marland Kitchen PHENTERMINE HCL PO Take by mouth 2 (two) times daily.      . valACYclovir (VALTREX) 1000 MG tablet TAKE 1 TABLET BY MOUTH DAILY AND 2 TABLETS PER DAY IS FLARE UP FOR 3 DAYS  30 tablet  6   No current facility-administered medications for this visit.    Family History  Problem Relation Age of Onset  . Irritable bowel syndrome Mother   . Ulcers Maternal Grandfather   . Colon cancer Neg Hx    . Leukemia Maternal Grandfather   . Hernia Mother     ROS:  Pertinent items are noted in HPI.  Otherwise, a comprehensive ROS was negative.  Exam:   BP 120/82  Pulse 88  Ht 5\' 7"  (1.702 m)  Wt 146 lb (66.225 kg)  BMI 22.86 kg/m2  LMP 12/21/2013 Height: 5\' 7"  (170.2 cm)  Ht Readings from Last 3 Encounters:  12/29/13 5\' 7"  (1.702 m)  09/26/13 5\' 7"  (1.702 m)  06/20/13 5\' 7"  (1.702 m)    General appearance: alert, cooperative and appears stated age Head: Normocephalic, without obvious abnormality, atraumatic Neck: no adenopathy, supple, symmetrical, trachea midline and thyroid normal to inspection and palpation Lungs: clear to auscultation bilaterally Breasts: normal appearance, no masses or tenderness, positive findings: implants bilaterally Heart: regular rate and rhythm Abdomen: soft, non-tender; no masses,  no organomegaly Extremities: extremities normal, atraumatic, no cyanosis or edema Skin: Skin color, texture, turgor normal. No rashes or lesions Lymph nodes: Cervical, supraclavicular, and axillary nodes normal. No abnormal inguinal nodes palpated Neurologic: Grossly normal   Pelvic: External genitalia:  no lesions, no evidence of condyloma              Urethra:  normal appearing urethra  with no masses, tenderness or lesions              Bartholin's and Skene's: normal                 Vagina: normal appearing vagina with normal color and discharge, no lesions              Cervix: anteverted              Pap taken: yes Bimanual Exam:  Uterus:  normal size, contour, position, consistency, mobility, non-tender              Adnexa: no mass, fullness, tenderness               Rectovaginal: Confirms               Anus:  normal sphincter tone, no lesions  A:  Well Woman with normal exam  Perimenopausal  History of endometriosis - off OCP since January  P:   Reviewed health and wellness pertinent to exam  Pap smear taken today  Will follow with lipid and  TSH  Mammogram is due and she will schedule  Counseled on breast self exam, mammography screening, adequate intake of calcium and vitamin D, diet and exercise return annually or prn  An After Visit Summary was printed and given to the patient.

## 2013-12-29 NOTE — Patient Instructions (Signed)

## 2013-12-30 LAB — LIPID PANEL
Cholesterol: 246 mg/dL — ABNORMAL HIGH (ref 0–200)
HDL: 90 mg/dL (ref >39)
LDL Cholesterol: 146 mg/dL — ABNORMAL HIGH (ref 0–99)
Total CHOL/HDL Ratio: 2.7 Ratio
Triglycerides: 52 mg/dL (ref <150)
VLDL: 10 mg/dL (ref 0–40)

## 2013-12-30 LAB — TSH: TSH: 2.364 u[IU]/mL (ref 0.350–4.500)

## 2014-01-04 LAB — IPS PAP TEST WITH HPV

## 2014-01-05 NOTE — Progress Notes (Signed)
Encounter reviewed by Dr. Oneta Sigman Silva.  

## 2014-01-09 ENCOUNTER — Other Ambulatory Visit: Payer: Self-pay

## 2014-01-09 DIAGNOSIS — Z1231 Encounter for screening mammogram for malignant neoplasm of breast: Secondary | ICD-10-CM

## 2014-01-16 ENCOUNTER — Ambulatory Visit
Admission: RE | Admit: 2014-01-16 | Discharge: 2014-01-16 | Disposition: A | Discharge status: Home / Self Care | Payer: BC Managed Care – PPO | Source: Ambulatory Visit

## 2014-01-16 ENCOUNTER — Ambulatory Visit: Payer: BC Managed Care – PPO

## 2014-01-16 DIAGNOSIS — Z1231 Encounter for screening mammogram for malignant neoplasm of breast: Secondary | ICD-10-CM

## 2014-01-18 ENCOUNTER — Other Ambulatory Visit: Payer: Self-pay | Admitting: Nurse Practitioner

## 2014-01-18 DIAGNOSIS — R928 Other abnormal and inconclusive findings on diagnostic imaging of breast: Secondary | ICD-10-CM

## 2014-01-29 ENCOUNTER — Other Ambulatory Visit: Payer: Self-pay | Admitting: *Deleted

## 2014-01-29 NOTE — Telephone Encounter (Signed)
Incoming fax requesting Fluconazole 150 mg  Last AEX 12/29/13 Last refill 10/12/13 #1/1 refills  Please approve or deny Rx.

## 2014-01-30 MED ORDER — FLUCONAZOLE 150 MG PO TABS: 150.0000 mg | ORAL_TABLET | Freq: Once | ORAL | Status: DC

## 2014-01-31 ENCOUNTER — Ambulatory Visit
Admission: RE | Admit: 2014-01-31 | Discharge: 2014-01-31 | Disposition: A | Discharge status: Home / Self Care | Payer: BC Managed Care – PPO | Source: Ambulatory Visit | Attending: Nurse Practitioner | Admitting: Nurse Practitioner

## 2014-01-31 ENCOUNTER — Other Ambulatory Visit: Payer: Self-pay | Admitting: Nurse Practitioner

## 2014-01-31 DIAGNOSIS — R928 Other abnormal and inconclusive findings on diagnostic imaging of breast: Secondary | ICD-10-CM

## 2014-01-31 DIAGNOSIS — N6320 Unspecified lump in the left breast, unspecified quadrant: Secondary | ICD-10-CM

## 2014-02-12 ENCOUNTER — Other Ambulatory Visit: Payer: Self-pay | Admitting: Nurse Practitioner

## 2014-02-12 ENCOUNTER — Ambulatory Visit
Admission: RE | Admit: 2014-02-12 | Discharge: 2014-02-12 | Disposition: A | Discharge status: Home / Self Care | Payer: BC Managed Care – PPO | Source: Ambulatory Visit | Attending: Nurse Practitioner | Admitting: Nurse Practitioner

## 2014-02-12 DIAGNOSIS — N6320 Unspecified lump in the left breast, unspecified quadrant: Secondary | ICD-10-CM

## 2014-04-07 ENCOUNTER — Other Ambulatory Visit: Payer: Self-pay | Admitting: Nurse Practitioner

## 2014-04-09 NOTE — Telephone Encounter (Signed)
Last refilled: 09/04/13 #30/ 6 refills Last AEX: 12/29/13 with Ms. Patty  Please Advise refills

## 2014-04-27 ENCOUNTER — Other Ambulatory Visit: Payer: Self-pay | Admitting: Nurse Practitioner

## 2014-04-27 NOTE — Telephone Encounter (Signed)
Last AEX: 12/29/13 Last refill:01/30/14 #2 tabs X 0 Current AEX:NS  Please advise

## 2014-07-09 ENCOUNTER — Encounter: Payer: Self-pay | Admitting: Nurse Practitioner

## 2014-07-09 ENCOUNTER — Ambulatory Visit (INDEPENDENT_AMBULATORY_CARE_PROVIDER_SITE_OTHER): Payer: BC Managed Care – PPO | Admitting: Nurse Practitioner

## 2014-07-09 ENCOUNTER — Telehealth: Payer: Self-pay | Admitting: Nurse Practitioner

## 2014-07-09 VITALS — BP 120/74 | HR 96 | Ht 67.0 in | Wt 156.0 lb

## 2014-07-09 DIAGNOSIS — N909 Noninflammatory disorder of vulva and perineum, unspecified: Secondary | ICD-10-CM

## 2014-07-09 MED ORDER — FLUCONAZOLE 150 MG PO TABS: 150.0000 mg | ORAL_TABLET | Freq: Once | ORAL | Status: DC

## 2014-07-09 NOTE — Telephone Encounter (Signed)
Patient is asking for an appointment ASAP. Patient says she has a "tiny wart on the inside of her leg and wants an appointment to see Edman Circle."

## 2014-07-09 NOTE — Progress Notes (Signed)
Subjective:     Patient ID: [name] [name], female   DOB: 18-Aug-1970, 43 y.o.   MRN: [id number]  HPI This 43 yo WS Fe had a perirectal lesion removed in August 2013 that was found to be a condyloma.  She now thinks this lesion has come back and wants it removed again soon.  She has had no change in partner.  No other lesions noted. She is having no other symptoms except for a slight vaginal itching.     Review of Systems  Constitutional: Negative.   HENT: Negative.   Respiratory: Negative.   Cardiovascular: Negative.   Gastrointestinal: Negative.   Genitourinary: Positive for vaginal discharge. Negative for urgency, frequency, flank pain, vaginal bleeding, vaginal pain, menstrual problem, pelvic pain and dyspareunia.  Musculoskeletal: Negative.   Skin: Negative.   Neurological: Negative.   Psychiatric/Behavioral: Negative.        Recent stressors as she thought she was going to move to [place] with a job transfer and then at last minute they changed the plan.       Objective:   Physical Exam  Constitutional: She is oriented to person, place, and time. She appears well-developed and well-nourished.  Abdominal: Soft.  Genitourinary:  Some white thick vaginal discharge consistent with yeast vaginitis.  At the perianal area at same location is another lesion that is flesh colored and again looks like a skin tag as it did last time.  Musculoskeletal: Normal range of motion.  Neurological: She is alert and oriented to person, place, and time.  Psychiatric: She has a normal mood and affect. Her behavior is normal. Judgment and thought content normal.       Assessment:     Skin lesion - recurrent with prior excision by Dr. Sabra Heck showing condyloma    Plan:     Will place order for removal again and follow She is given a refill on Diflucan 150 mg

## 2014-07-09 NOTE — Telephone Encounter (Signed)
Left message to call Abigayle Wilinski at [phone]. 

## 2014-07-09 NOTE — Telephone Encounter (Signed)
Spoke with patient. Patient is at work and unable to give many details regarding area of concern. "I can't really talk. I have a bump close to my rectum. It is the same one I had before that we took off. If you look in my chart you will see." Patient would like to come in today to see [name] Cage, FNP. Requesting late afternoon appointment due to work.Appointment scheduled for today at 3:30pm with [name] [name], [place]. Patient is agreeable to date and time. Patient would like to know if area can be removed today. Advised patient will need to be evaluation first and [name] Cage, FNP will make further recommendations about removal. MD will also be in office if anything is needed.  Routing to provider for final review. Patient agreeable to disposition. Will close encounter

## 2014-07-10 ENCOUNTER — Telehealth: Payer: Self-pay | Admitting: Obstetrics & Gynecology

## 2014-07-10 NOTE — Progress Notes (Signed)
Reviewed personally.  M. Suzanne Khaliyah Northrop, MD.  

## 2014-07-10 NOTE — Telephone Encounter (Signed)
Spoke with patient.  Has peri-rectal skin lesion. Hx condyloma. Patient very anxious for removal, requests earliest possible date.  Scheduled for 07/13/14 with Dr. Sabra Heck for procedure.  Order information given to Felipa Emory for pre-certifcation.  Routing to provider for final review. Patient agreeable to disposition. Will close encounter

## 2014-07-10 NOTE — Telephone Encounter (Signed)
Patient states she is calling to schedule a procedure. She does not remember the name of the procedure.

## 2014-07-12 ENCOUNTER — Telehealth: Payer: Self-pay | Admitting: Obstetrics & Gynecology

## 2014-07-12 NOTE — Telephone Encounter (Signed)
Pt needs to reschedule her procedure appointment Friday with Dr Sabra Heck because she has another appointment at 1:30.

## 2014-07-12 NOTE — Telephone Encounter (Signed)
Spoke with patient. Offered patient appointment Monday 12/7 at 2pm. "Let me ask you this. 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 Patient is agreeable to date and time.   Routing to provider for final review. Patient agreeable to disposition. Will close encounter

## 2014-07-13 ENCOUNTER — Ambulatory Visit: Payer: BC Managed Care – PPO | Admitting: Obstetrics & Gynecology

## 2014-07-16 ENCOUNTER — Ambulatory Visit (INDEPENDENT_AMBULATORY_CARE_PROVIDER_SITE_OTHER): Payer: BC Managed Care – PPO | Admitting: Obstetrics & Gynecology

## 2014-07-16 VITALS — BP 138/78 | HR 72 | Resp 16 | Wt 157.6 lb

## 2014-07-16 DIAGNOSIS — N9089 Other specified noninflammatory disorders of vulva and perineum: Secondary | ICD-10-CM

## 2014-07-16 MED ORDER — FLUCONAZOLE 150 MG PO TABS: 150.0000 mg | ORAL_TABLET | Freq: Once | ORAL | Status: DC

## 2014-07-19 ENCOUNTER — Telehealth: Payer: Self-pay

## 2014-07-19 NOTE — Telephone Encounter (Signed)
Neosporin is fine to use.  If she is seeing any signs of infection (this is too early for that), she needs to call and be seen.  Thanks.  (FYI--area removed was quite small.  I didn't even use a punch biopsy because that was too big.)

## 2014-07-19 NOTE — Telephone Encounter (Signed)
Spoke with patient. Advised patient of results as seen below. Patient is agreeable and will call with any new symptoms. "The area right now if really open. If there anything that I could cover it with or put on it while it is healing. It is a little red." Advised patient would send a message over to [place] and return call with further recommendations. Patient is agreeable.

## 2014-07-19 NOTE — Telephone Encounter (Signed)
Left message to call [name] [name] at [phone]. 

## 2014-07-19 NOTE — Telephone Encounter (Signed)
-----   Message from Lyman Speller, MD sent at 07/19/2014  6:43 AM EST ----- Please inform pt area I removed was a condyloma.  Removed earlier this year and pt began to feel it again.  If returns again, I will treat with topical medication that will hopefully keep it away for good.  Pt needs to call if have any new symptoms.  Thanks.

## 2014-07-20 NOTE — Telephone Encounter (Signed)
Returning a call to Kaitlyn. °

## 2014-07-20 NOTE — Telephone Encounter (Signed)
Spoke with patient. Advised patient of message as seen below from Dr.Miller. Patient is agreeable and verbalizes understanding.  Routing to provider for final review. Patient agreeable to disposition. Will close encounter   

## 2014-07-20 NOTE — Telephone Encounter (Signed)
Patient is returning a call to [place].

## 2014-07-20 NOTE — Telephone Encounter (Signed)
Left message to call Marcene Laskowski at [phone]. 

## 2014-07-30 NOTE — Progress Notes (Signed)
Subjective:     Patient ID: [name] [name], female   DOB: 07-Nov-1970, 43 y.o.   MRN: [id number]  HPI 43 yo G0 SWF with hx of prior vulvar lesion that was a condyloma.  This was removed 8/13.  Pt is feeling a very small but raised area in the same location and wants me to look at it.  No vaginal discharge or abnormal bleeding has come from the lesion.  Pt reports she is having a small amount of itching.  Mostly, it is bothering her mentally.  Review of Systems  All other systems reviewed and are negative.      Objective:   Physical Exam  Constitutional: She is oriented to person, place, and time. She appears well-developed and well-nourished.  Genitourinary: Vagina normal.    There is no rash, tenderness or lesion on the right labia. There is no rash, tenderness or lesion on the left labia.  Neurological: She is alert and oriented to person, place, and time.  Skin: Skin is warm and dry.  Psychiatric: She has a normal mood and affect.   Pt desirous for removal.  I am in agreement as it is probably another small condyloma.  Declines topical treatment.  Consent obtained.  Lesion cleansed with Betadine x 3.  0.5cc 1% Lidocaine instilled.  Lesion lifted with sterile pick-ups and excised completely.  Silver nitrate applied for excellent hemostasis.  Pt tolerated procedure well.  No dressing applied due to location.    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  Tissue sent to pathology and results will be called to patient.

## 2014-08-06 ENCOUNTER — Telehealth: Payer: Self-pay | Admitting: Obstetrics & Gynecology

## 2014-08-06 ENCOUNTER — Other Ambulatory Visit: Payer: Self-pay | Admitting: Nurse Practitioner

## 2014-08-06 MED ORDER — FLUCONAZOLE 150 MG PO TABS: 150.0000 mg | ORAL_TABLET | Freq: Once | ORAL | Status: DC

## 2014-08-06 NOTE — Telephone Encounter (Signed)
Spoke with patient. She has been having ongoing vaginal itching with hx of chronic yeast.  Advised rx sent and to please call back if not improving for office visit. Patient agreeable.  Routing to provider for final review. Patient agreeable to disposition. Will close encounter

## 2014-08-06 NOTE — Telephone Encounter (Signed)
Refill has been sent.  °

## 2014-08-06 NOTE — Telephone Encounter (Signed)
Pt would like a Rx for a yeast infection sent to pharmacy because she is leaving out of town today for a funeral. [facility] [address] Phone [phone]

## 2014-08-06 NOTE — Telephone Encounter (Signed)
Patty,  Patient with recurrent yeast. Requests diflucan rx as she is leaving to go out of town for funeral.  Treated with diflucan x 2 11/30 and 12/7.

## 2014-08-30 ENCOUNTER — Telehealth: Payer: Self-pay | Admitting: Obstetrics & Gynecology

## 2014-08-30 ENCOUNTER — Other Ambulatory Visit: Payer: Self-pay | Admitting: Obstetrics & Gynecology

## 2014-08-30 DIAGNOSIS — N644 Mastodynia: Secondary | ICD-10-CM

## 2014-08-30 NOTE — Telephone Encounter (Signed)
OK to send order for diagnostic MMG due to breast pain.

## 2014-08-30 NOTE — Telephone Encounter (Signed)
Spoke with patient advised order for diagnostic MMG of left breast has been sent to the [facility]. Patient is agreeable and will call to schedule appointment at this time.  Routing to provider for final review. Patient agreeable to disposition. Will close encounter

## 2014-08-30 NOTE — Telephone Encounter (Addendum)
Patient is requesting a referral to the breast center for her MMG for breast pain.

## 2014-08-30 NOTE — Telephone Encounter (Signed)
Spoke with patient. Patient states that she has ongoing pain in her left breast. "I have been seen for multiple mammograms and a biopsy last year. I think they may have told me to come back in 6 months. I called today because the pain is back in the same breast in the same spot. They told me to call you to get an order." Advised patient needs OV for evaluation before order. "When I was seen at the [facility] last they told me just to call if it returned so they could see me." Patient was last seen on 02/14/14 at the [facility]. Advised will need to speak with Dr.Miller regarding order and return call. Patient is agreeable.

## 2014-09-03 ENCOUNTER — Other Ambulatory Visit: Payer: Self-pay | Admitting: Obstetrics & Gynecology

## 2014-09-03 ENCOUNTER — Other Ambulatory Visit: Payer: Self-pay

## 2014-09-03 DIAGNOSIS — N644 Mastodynia: Secondary | ICD-10-CM

## 2014-09-06 ENCOUNTER — Other Ambulatory Visit: Payer: Self-pay | Admitting: Nurse Practitioner

## 2014-09-06 NOTE — Telephone Encounter (Signed)
Medication refill request: Dilfucan 150 mg  Last AEX:  12/29/13 with Ms. Patty Next AEX: No AEX scheduled for 2016 Last MMG (if hormonal medication request): N/A Refill authorized: Please advise

## 2014-09-07 ENCOUNTER — Ambulatory Visit
Admission: RE | Admit: 2014-09-07 | Discharge: 2014-09-07 | Disposition: A | Discharge status: Home / Self Care | Payer: BLUE CROSS/BLUE SHIELD | Source: Ambulatory Visit | Attending: Obstetrics & Gynecology | Admitting: Obstetrics & Gynecology

## 2014-09-07 DIAGNOSIS — N644 Mastodynia: Secondary | ICD-10-CM

## 2014-09-13 ENCOUNTER — Telehealth: Payer: Self-pay | Admitting: Emergency Medicine

## 2014-09-13 NOTE — Telephone Encounter (Signed)
-----   Message from Lyman Speller, MD sent at 09/07/2014  5:29 PM EST ----- Out of MMG hold.  Please call pt.  MMG was negative except for biopsy proven fibroadenoma.  Of course, her implants were seen as well.  If pain continues, she needs to call and be seen.

## 2014-09-13 NOTE — Telephone Encounter (Signed)
Patient notified of message from Dr. Sabra Heck and agreeable to instructions. Will follow up prn.

## 2014-11-27 ENCOUNTER — Telehealth: Payer: Self-pay | Admitting: Nurse Practitioner

## 2014-11-27 MED ORDER — FLUCONAZOLE 150 MG PO TABS: 150.0000 mg | ORAL_TABLET | Freq: Once | ORAL | Status: DC

## 2014-11-27 NOTE — Telephone Encounter (Signed)
Pt is in [place] currently and states she has a very bad yeast infection. Pt requests medication called into local pharmacy.   Pharmacy: [facility] [address] [facility]

## 2014-11-27 NOTE — Telephone Encounter (Signed)
  Patient returned call. She states she had surgery 5 weeks ago and developed sinus infection and has been on multiple rounds of abx. She has been having vaginal itching and white discharge, feels it is yeast.  Follow up appointment scheduled and stressed importance to ensure treating yeast and rule out other reasons for vaginal symptoms. Patient agreeable.  Routing to provider for final review. Patient agreeable to disposition. Will close encounter

## 2014-11-27 NOTE — Telephone Encounter (Signed)
Message left to return call to [place] at [phone].   Spoke with Milford Cage, FNP. Patient with hx of Chronic yeast and BV. Recent fill of Diflucan 09/07/14 and [facility]08/14 [facility]   Okay to place order for  Diflucan 150 mg Take one tablet po today and one tablet po in 72 hours if symptoms remain. But, MUST have follow up in office.  Order pended for patient return call and to schedule follow up office visit.

## 2014-12-11 ENCOUNTER — Encounter: Payer: Self-pay | Admitting: Nurse Practitioner

## 2014-12-11 ENCOUNTER — Ambulatory Visit (INDEPENDENT_AMBULATORY_CARE_PROVIDER_SITE_OTHER): Payer: BLUE CROSS/BLUE SHIELD | Admitting: Nurse Practitioner

## 2014-12-11 VITALS — BP 112/72 | HR 64

## 2014-12-11 DIAGNOSIS — N760 Acute vaginitis: Secondary | ICD-10-CM

## 2014-12-11 DIAGNOSIS — N912 Amenorrhea, unspecified: Secondary | ICD-10-CM

## 2014-12-11 LAB — POCT URINE PREGNANCY: Preg Test, Ur: NEGATIVE

## 2014-12-11 MED ORDER — FLUCONAZOLE 150 MG PO TABS: 150.0000 mg | ORAL_TABLET | Freq: Once | ORAL | Status: DC

## 2014-12-11 NOTE — Progress Notes (Signed)
43 y.o.SW Fe G0 here with complaint of vaginal symptoms of itching, burning, and increase discharge. Describes discharge as thin and white. Onset of symptoms 4 days ago during the time she was traveling. Denies new personal products or vaginal dryness. Earlier this past 2 months had a sinusitis and was treated with multiple antibiotics. She did take 2 Diflucan with some help.  She also has had liposuction of the thighs and having to wear a tight restrictive garment for 3 weeks.  This also was very aggravating to her inner thighs and vaginal area. She is now dating a former partner who was tested for STD and all was norma  - so has no STD concerns at this time.   Urinary symptoms minimal other than frequent urination that she does not relate to an increase in fluids.  Denies dysuria, fever or chills.  Contraception is withdrawal.    O:Healthy female WDWN Affect: normal, orientation x 3  Exam: alert, no acte distress Abdomen: soft and non tender Lymph node: no enlargement or tenderness Pelvic exam: External genital: normal female BUS: negative, no lesions, there is slight irritation the right vulva more like a friction rub. Vagina: thin white to grey discharge noted.  Affirm taken Cervix: normal, non tender, no CMT Uterus: normal, non tender Adnexa:normal, non tender, no masses or fullness noted  Urine chemstrip is negative  A: Normal pelvic exam  Acute and chronic history of yeast - especially with recent antibiotics  R/O BV   P: Discussed findings of yeast or bacterial and etiology. Discussed Aveeno or baking soda sitz bath for comfort. Avoid moist clothes or pads for extended period of time. If working out in gym clothes or swim suits for long periods of time change underwear or bottoms of swimsuit if possible. Olive Oil/Coconut Oil use for skin protection prior to activity can be used to external skin.  Rx: she is given Diflucan 150 mg weekly X 4 - which is her usual treatment following  antibiotics  Will follow with Affirm test.  RV prn

## 2014-12-11 NOTE — Patient Instructions (Signed)

## 2014-12-12 LAB — WET PREP BY MOLECULAR PROBE
Candida species: NEGATIVE
Gardnerella vaginalis: NEGATIVE
Trichomonas vaginosis: NEGATIVE

## 2014-12-16 NOTE — Progress Notes (Signed)
Encounter reviewed by Dr. Antoinette Haskett Silva.  

## 2015-01-30 ENCOUNTER — Other Ambulatory Visit: Payer: Self-pay

## 2015-01-30 DIAGNOSIS — Z1231 Encounter for screening mammogram for malignant neoplasm of breast: Secondary | ICD-10-CM

## 2015-02-12 ENCOUNTER — Encounter: Payer: Self-pay | Admitting: Nurse Practitioner

## 2015-02-12 ENCOUNTER — Ambulatory Visit (INDEPENDENT_AMBULATORY_CARE_PROVIDER_SITE_OTHER): Payer: BLUE CROSS/BLUE SHIELD | Admitting: Nurse Practitioner

## 2015-02-12 VITALS — BP 120/70 | HR 88 | Temp 98.2°F | Ht 67.0 in | Wt 164.0 lb

## 2015-02-12 DIAGNOSIS — N912 Amenorrhea, unspecified: Secondary | ICD-10-CM | POA: Diagnosis not present

## 2015-02-12 DIAGNOSIS — Z113 Encounter for screening for infections with a predominantly sexual mode of transmission: Secondary | ICD-10-CM

## 2015-02-12 LAB — POCT URINE PREGNANCY: Preg Test, Ur: NEGATIVE

## 2015-02-12 NOTE — Progress Notes (Signed)
  44 y.o.Single White G53 female presents with amenorrhea since 01/08/15.  Her last menses prior was 11/13/14 was lighter.  She did feel maybe mittlesmertz earlier this month.  For the past year her cycles are becoming more irregular at every 1-1.5 months. She has no vaso symptoms other than feeling 'warmer' in general.   She is very frustrated about her weight Increased in past 3-4 months.  She is still working out but maybe not as much as before.  She did have Liposuction on her thighs in April.    Periods were regular in the past, occurring every 4 weeks. Patient reports weight gain . Reports condoms sometimes for birth control. Urine pregnancy negative.  Patient denies having been diagnosed with metabolic syndrome, denies polycystic ovaries.   A comprehensive review of systems was negative. no history of head injury BP 120/70 mmHg  Pulse 88  Temp(Src) 98.2 F (36.8 C) (Oral)  Ht 5\' 7"  (1.702 m)  Wt 164 lb (74.39 kg)  BMI 25.68 kg/m2  LMP 01/08/2015  General Appearance:    Alert, cooperative, no distress, appears stated age  Head:    Normocephalic, without obvious abnormality, atraumatic  Eyes:    PERRLA, conjunctiva clear   Chest Wall:    No tenderness or deformity  Abdomen:     Soft, non-tender, bowel sounds active all four quadrants,    no masses, no organomegaly  Genitalia:    Normal female without lesion, discharge or tenderness.  A repeat of STD's is done  Extremities:   Extremities normal, atraumatic, no cyanosis or edema  Skin:   Skin color, texture, turgor normal, no rashes or lesions         Assessment:  Amenorrhea with a negative UPT   Perimenopausal changes   R/O STD's  Plan:  Discussed with patient factors that may be contributory to menstrual abnormalities include: diet, recent stressors, and hormonal causes.   Previous treatments for menstrual abnormalities include Provera challenge .    Labs: AMH, FSH, TSH, Prolactin, HGB AIC   GC, CHL and STD panel  Serum HCG - if  this is negative will start with a Provera challenge and she will need AEX some time in the near future and can discuss other options for cycle control - she does not want OCP secondary to weight gain.  She does not want Nexplanon.  She is very concerned about her chances for a pregnancy during this time of change

## 2015-02-13 ENCOUNTER — Other Ambulatory Visit: Payer: Self-pay | Admitting: Nurse Practitioner

## 2015-02-13 LAB — STD PANEL
HIV 1&2 Ab, 4th Generation: NONREACTIVE
Hepatitis B Surface Ag: NEGATIVE

## 2015-02-13 LAB — PROLACTIN: Prolactin: 8.6 ng/mL

## 2015-02-13 LAB — HCG, SERUM, QUALITATIVE: Preg, Serum: NEGATIVE

## 2015-02-13 LAB — FOLLICLE STIMULATING HORMONE: FSH: 6.2 m[IU]/mL

## 2015-02-13 LAB — HEMOGLOBIN A1C
Hgb A1c MFr Bld: 5.4 % (ref <5.7)
Mean Plasma Glucose: 108 mg/dL (ref <117)

## 2015-02-13 LAB — TSH: TSH: 1.658 u[IU]/mL (ref 0.350–4.500)

## 2015-02-13 MED ORDER — MEDROXYPROGESTERONE ACETATE 10 MG PO TABS: 10.0000 mg | ORAL_TABLET | Freq: Every day | ORAL | Status: DC

## 2015-02-14 ENCOUNTER — Telehealth: Payer: Self-pay | Admitting: Nurse Practitioner

## 2015-02-14 LAB — IPS N GONORRHOEA AND CHLAMYDIA BY PCR

## 2015-02-14 NOTE — Telephone Encounter (Signed)
Spoke with patient. Patient states that she started her cycle yesterday 02/13/2015. Patient was to start provera challenge due to amenorrhea. Advised with cycle starting on its own will not need Provera at this time. Patient has aex scheduled for 02/27/2015 with Milford Cage, FNP will discuss cycles further at that time.   Routing to provider for final review. Patient agreeable to disposition. Will close encounter.   Patient aware provider will review message and nurse will return call if any additional advice or change of disposition.

## 2015-02-14 NOTE — Telephone Encounter (Signed)
Patient calling to give PG an update. She states she did not have to take the medication her cycle started yesterday

## 2015-02-15 LAB — ANTI MULLERIAN HORMONE: AMH AssessR: 0.07 ng/mL

## 2015-02-18 ENCOUNTER — Encounter: Payer: Self-pay | Admitting: Internal Medicine

## 2015-02-18 NOTE — Progress Notes (Signed)
Encounter reviewed by Dr. Brook Amundson C. Silva.  

## 2015-02-27 ENCOUNTER — Ambulatory Visit (INDEPENDENT_AMBULATORY_CARE_PROVIDER_SITE_OTHER): Payer: BLUE CROSS/BLUE SHIELD | Admitting: Nurse Practitioner

## 2015-02-27 ENCOUNTER — Encounter: Payer: Self-pay | Admitting: Nurse Practitioner

## 2015-02-27 VITALS — BP 108/76 | HR 60 | Ht 67.5 in | Wt 166.0 lb

## 2015-02-27 DIAGNOSIS — N951 Menopausal and female climacteric states: Secondary | ICD-10-CM

## 2015-02-27 DIAGNOSIS — N760 Acute vaginitis: Secondary | ICD-10-CM | POA: Diagnosis not present

## 2015-02-27 DIAGNOSIS — Z0000 Encounter for general adult medical examination without abnormal findings: Secondary | ICD-10-CM | POA: Diagnosis not present

## 2015-02-27 DIAGNOSIS — Z01419 Encounter for gynecological examination (general) (routine) without abnormal findings: Secondary | ICD-10-CM | POA: Diagnosis not present

## 2015-02-27 MED ORDER — BUPROPION HCL ER (XL) 150 MG PO TB24: 150.0000 mg | ORAL_TABLET | Freq: Every day | ORAL | Status: DC

## 2015-02-27 MED ORDER — VALACYCLOVIR HCL 1 G PO TABS: ORAL_TABLET | ORAL | Status: DC

## 2015-02-27 NOTE — Patient Instructions (Addendum)

## 2015-02-27 NOTE — Progress Notes (Signed)
Patient ID: [name] [name], female   DOB: 03/14/1971, 44 y.o.   MRN: [id number] 44 y.o. G0P0 Single  Caucasian Fe here for annual exam.  She has had several episodes of amenorrhea this past year.  When seen here 2 weeks ago she was given Provera following normal lab studies.  But before they came back she ha a menses on her own that was normal.  During the evaluation she also had AMH that was quite low at 0.07.  During the visit today she wanted to discuss more about that.  Patient's last menstrual period was 02/14/2015 (exact date).          Sexually active: Yes.    The current method of family planning is none.  (Since AMH test was low) Exercising: Yes.    weights and running 2-3 times per week Smoker:  no  Health Maintenance: Pap: 12/29/13, Negative with neg HR HPV (remote CIN I 1998) MMG:  01/16/14 with left diagnostic MMG and ultrasound with biopsy on 02/12/14 showing fibroadenoma; return to yearly screening. Diagnostic 3D MMG on left 09/07/14, Bi-Rads 1:  negative, return to annual screening Colonoscopy: 2010 due to ?IBS/constipation; normal; repeat in 10 years (age 1) BMD: 2011, heel, normal TDaP: 2009 Labs:  02/2015 in EPIC   reports that she has never smoked. She has never used smokeless tobacco. She reports that she does not drink alcohol or use illicit drugs.  Past Medical History  Diagnosis Date  . IBS (irritable bowel syndrome)   . Internal thrombosed hemorrhoids   . Hiatal hernia   . History of campylobacteriosis   . Migraine     without aura  . Condyloma acuminata 2001  . Endometriosis 8/95    DepoLupron 4/96-10/96  . Abnormal Pap smear, atypical squamous cells of undetermined sign (ASC-US) 10/98    CIN 1  . HSV-2 infection     Past Surgical History  Procedure Laterality Date  . Breast enhancement surgery  02/2000  . Exploratory laparotomy  8/95    endometriosis  . Breast mass excision Right 12/01    fibroadenoma    Current Outpatient Prescriptions   Medication Sig Dispense Refill  . buPROPion (WELLBUTRIN XL) 150 MG 24 hr tablet Take 1 tablet (150 mg total) by mouth daily. 90 tablet 0  . clonazePAM (KLONOPIN) 1 MG tablet Only taking x one more week  2  . fluticasone (FLONASE) 50 MCG/ACT nasal spray Place into both nostrils daily.    . pseudoephedrine (SUDAFED) 30 MG tablet Take 30 mg by mouth every 4 (four) hours as needed for congestion.    . valACYclovir (VALTREX) 1000 MG tablet TAKE 1 TABLET BY MOUTH DAILY AND 2 TABLETS PER DAY IS FLARE UP FOR 3 DAYS 30 tablet 11   No current facility-administered medications for this visit.    Family History  Problem Relation Age of Onset  . Irritable bowel syndrome Mother   . Ulcers Maternal Grandfather   . Colon cancer Neg Hx   . Leukemia Maternal Grandfather   . Hernia Mother     ROS:  Pertinent items are noted in HPI.  Otherwise, a comprehensive ROS was negative.  Exam:   BP 108/76 mmHg  Pulse 60  Ht 5' 7.5" (1.715 m)  Wt 166 lb (75.297 kg)  BMI 25.60 kg/m2  LMP 02/14/2015 (Exact Date) Height: 5' 7.5" (171.5 cm) Ht Readings from Last 3 Encounters:  02/27/15 5' 7.5" (1.715 m)  02/12/15 5\' 7"  (1.702 m)  07/09/14 5\' 7"  (1.702  m)    General appearance: alert, cooperative and appears stated age, seems sad today. Head: Normocephalic, without obvious abnormality, atraumatic Neck: no adenopathy, supple, symmetrical, trachea midline and thyroid normal to inspection and palpation Lungs: clear to auscultation bilaterally Breasts: normal appearance, no masses or tenderness, positive findings: implants bilaterally Heart: regular rate and rhythm Abdomen: soft, non-tender; no masses,  no organomegaly Extremities: extremities normal, atraumatic, no cyanosis or edema Skin: Skin color, texture, turgor normal. No rashes or lesions Lymph nodes: Cervical, supraclavicular, and axillary nodes normal. No abnormal inguinal nodes palpated Neurologic: Grossly normal   Pelvic: External genitalia:   no lesions              Urethra:  normal appearing urethra with no masses, tenderness or lesions              Bartholin's and Skene's: normal                 Vagina: normal appearing vagina with normal color and thick white vaginal discharge, no lesions              Cervix: anteverted              Pap taken: Yes.   Bimanual Exam:  Uterus:  normal size, contour, position, consistency, mobility, non-tender              Adnexa: no mass, fullness, tenderness               Rectovaginal: Confirms               Anus:  normal sphincter tone, no lesions  Chaperone present:  yes  A:  Well Woman with normal exam  Perimenopausal C/W irregular menses History of endometriosis - off OCP since January 2015  Slight weight gain and does not want OCP for cycle regulation  Situational anxiety  R/O yeast vaginitis  Remote history of CIN I 1998  History of HSV   P:   Reviewed health and wellness pertinent to exam  Pap smear as above  Mammogram is scheduled for 03/06/15  Did not repeat STD's today as this was done recently  Did do an Affirm since she may have a yeast infection. - will follow  Refill on Valtrex to use as directed for a year  Discussed AMH study and she will need infertility specialist to help her decide on a potential pregnancy in the future.  She has discussed with her current partner of 6 months and he is unsure what he wants.  She feels nervous and more anxious over her decision to wait with child bearing.  She is not sure if this is the right partner for her.  Overall a bit overwhelmed. She request medication to help her deal better but not cause weight gain or affect sexual function.  Will try her on Wellbutrin 150 mg daily for 3 months and see how she does. RX # 90 only is given and then will have a consult visit.  Counseled on breast self exam, mammography screening, adequate intake of calcium and vitamin D, diet and exercise return annually or prn  An After Visit Summary  was printed and given to the patient.

## 2015-02-28 LAB — WET PREP BY MOLECULAR PROBE
Candida species: NEGATIVE
Gardnerella vaginalis: NEGATIVE
Trichomonas vaginosis: NEGATIVE

## 2015-03-04 ENCOUNTER — Telehealth: Payer: Self-pay | Admitting: Nurse Practitioner

## 2015-03-04 ENCOUNTER — Other Ambulatory Visit: Payer: Self-pay | Admitting: Nurse Practitioner

## 2015-03-04 LAB — IPS PAP TEST WITH HPV

## 2015-03-04 MED ORDER — FLUCONAZOLE 150 MG PO TABS: 150.0000 mg | ORAL_TABLET | Freq: Once | ORAL | Status: DC

## 2015-03-04 NOTE — Telephone Encounter (Signed)
Patient states she saw Ms Chong Sicilian for yeast recently and the prescription they discussed was not sent in.  [facility] [address] [place]

## 2015-03-04 NOTE — Telephone Encounter (Signed)
Patients affirm came back negative. I left patient a message to find out if she is having SX of yeast. Waiting for patient to call back -eh

## 2015-03-04 NOTE — Telephone Encounter (Signed)
[name] [name] called back and said that she has been swimming all weekend and feels sure that she has yeast now. She is requesting the medication that was discussed at her recent visit- eh

## 2015-03-06 ENCOUNTER — Ambulatory Visit
Admission: RE | Admit: 2015-03-06 | Discharge: 2015-03-06 | Disposition: A | Discharge status: Home / Self Care | Payer: BLUE CROSS/BLUE SHIELD | Source: Ambulatory Visit

## 2015-03-06 DIAGNOSIS — Z1231 Encounter for screening mammogram for malignant neoplasm of breast: Secondary | ICD-10-CM

## 2015-03-06 NOTE — Progress Notes (Signed)
Encounter reviewed by Dr. Brook Amundson C. Silva.  

## 2015-09-27 ENCOUNTER — Encounter: Payer: Self-pay | Admitting: Nurse Practitioner

## 2015-09-27 ENCOUNTER — Ambulatory Visit (INDEPENDENT_AMBULATORY_CARE_PROVIDER_SITE_OTHER): Payer: BLUE CROSS/BLUE SHIELD | Admitting: Nurse Practitioner

## 2015-09-27 VITALS — BP 118/74 | HR 60 | Temp 98.4°F | Ht 67.5 in

## 2015-09-27 DIAGNOSIS — R350 Frequency of micturition: Secondary | ICD-10-CM

## 2015-09-27 DIAGNOSIS — R300 Dysuria: Secondary | ICD-10-CM | POA: Diagnosis not present

## 2015-09-27 LAB — POCT URINALYSIS DIPSTICK
Bilirubin, UA: NEGATIVE
Glucose, UA: NEGATIVE
Ketones, UA: NEGATIVE
Nitrite, UA: NEGATIVE
Protein, UA: NEGATIVE
Urobilinogen, UA: NEGATIVE
pH, UA: 5

## 2015-09-27 MED ORDER — FLUCONAZOLE 150 MG PO TABS: 150.0000 mg | ORAL_TABLET | Freq: Once | ORAL | Status: DC

## 2015-09-27 MED ORDER — NITROFURANTOIN MONOHYD MACRO 100 MG PO CAPS: 100.0000 mg | ORAL_CAPSULE | Freq: Two times a day (BID) | ORAL | Status: DC

## 2015-09-27 NOTE — Patient Instructions (Signed)

## 2015-09-27 NOTE — Progress Notes (Signed)
45 y.o.Single Caucasian female G0P0 here with complaint of UTI, with onset  on 09/25/15. Patient complaining of:  dysuria, hematuria, urinary frequency and urinary urgency. Patient denies fever, chills, nausea or back pain. No new personal products. Patient feels this is related to sexual activity.  They had gone to [facility] and was in the hot tub and SA often.  Denies vaginal symptoms.    Contraception is none since [facility] test was so low.  No change in partner. Patient does have adequate water intake.  Denies vaginal symptoms.   O: Healthy female WDWN Affect: Normal, orientation x 3 Skin : warm and dry CVAT: none bilateral Abdomen: negative for suprapubic tenderness  Pelvic exam: External genital area: normal, no lesions Bladder,Urethra tender, Urethral meatus: normal Vagina: normal vaginal discharge, normal appearance Cervix: normal, non tender Uterus:normal,non tender Adnexa: normal non tender, no fullness or masses  POCT:  Moderate RBC and trace leuk's   A:  R/O UTI  Normal pelvic exam  P: Reviewed findings of UTI and need for treatment. Rx:  Macrobid 100 mg BID # 14  May take OTC AZO prn Lab: Urine micro, culture Reviewed warning signs and symptoms of UTI and need to advise if occurring. Encouraged to limit soda, tea, and coffee   RV prn

## 2015-09-28 LAB — URINALYSIS, MICROSCOPIC ONLY
Casts: NONE SEEN [LPF]
Crystals: NONE SEEN [HPF]
Squamous Epithelial / LPF: NONE SEEN [HPF] (ref <=5)
Yeast: NONE SEEN [HPF]

## 2015-09-29 NOTE — Progress Notes (Signed)
Encounter reviewed by Dr. Brook Amundson C. Silva.  

## 2015-09-30 LAB — URINE CULTURE: Colony Count: >=100000

## 2015-10-07 ENCOUNTER — Telehealth: Payer: Self-pay | Admitting: Nurse Practitioner

## 2015-10-07 NOTE — Telephone Encounter (Signed)
Spoke with patient. Patient was seen in the office on 09/27/2015 for UTI symptoms. She was started on Macrobid 100 mg bid #14 0RF. Urine culture returned showing E.Coli. Patient finished her antibiotic on Friday 10/04/2015. Reports on Friday night she began to have increased lower back pain, chills, and possible fever. Reports her back pain has persisted. Denies worsening pain. Denies fevers or chills at this time. "I feel mainly like I have a fever and chills at night. I just do not have any energy. I am also still having to use the bathroom a lot." Pain is located on her left side. Advised she will need to be seen in the office for further evaluation. She is agreeable. Appointment scheduled for tomorrow 10/08/2015 at 11:15 am with Kem Boroughs, FNP. Declines earlier appointment. Advised if her symptoms worsen or she develops fever and chills again she will need to be seen at a local Urgent Care or ER over night. She is agreeable.  Routing to provider for final review. Patient agreeable to disposition. Will close encounter.

## 2015-10-07 NOTE — Telephone Encounter (Signed)
Patient called during lunch and left a message requesting a call from the nurse. She said, "I have some follow up questions about mid-back for the nurse."

## 2015-10-08 ENCOUNTER — Ambulatory Visit: Payer: BLUE CROSS/BLUE SHIELD | Admitting: Nurse Practitioner

## 2015-10-09 ENCOUNTER — Ambulatory Visit: Payer: BLUE CROSS/BLUE SHIELD | Admitting: Nurse Practitioner

## 2015-10-09 ENCOUNTER — Telehealth: Payer: Self-pay | Admitting: Nurse Practitioner

## 2015-10-09 NOTE — Telephone Encounter (Signed)
I spoke with patient regarding her "uti symptoms" appointment today with Edman Circle, FNP and the need to reschedule due to [facility] sick. Patient said "I will call back to reschedule if I feel the need". I offered patient appointments with another provider today. Patient declined.

## 2015-10-09 NOTE — Telephone Encounter (Signed)
Jasmine Awe, RN at 10/07/2015 2:19 PM     Status: Signed       Expand All Collapse All   Spoke with patient. Patient was seen in the office on 09/27/2015 for UTI symptoms. She was started on Macrobid 100 mg bid #14 0RF. Urine culture returned showing E.Coli. Patient finished her antibiotic on Friday 10/04/2015. Reports on Friday night she began to have increased lower back pain, chills, and possible fever. Reports her back pain has persisted. Denies worsening pain. Denies fevers or chills at this time. "I feel mainly like I have a fever and chills at night. I just do not have any energy. I am also still having to use the bathroom a lot." Pain is located on her left side. Advised she will need to be seen in the office for further evaluation. She is agreeable. Appointment scheduled for tomorrow 10/08/2015 at 11:15 am with Kem Boroughs, FNP. Declines earlier appointment. Advised if her symptoms worsen or she develops fever and chills again she will need to be seen at a local Urgent Care or ER over night. She is agreeable.  Routing to provider for final review. Patient agreeable to disposition. Will close encounter.          Left message to call [place] at [phone]. Need to follow up on her symptoms she reported last week.

## 2015-10-15 ENCOUNTER — Encounter: Payer: Self-pay | Admitting: Nurse Practitioner

## 2015-10-15 ENCOUNTER — Ambulatory Visit (INDEPENDENT_AMBULATORY_CARE_PROVIDER_SITE_OTHER): Payer: BLUE CROSS/BLUE SHIELD | Admitting: Nurse Practitioner

## 2015-10-15 VITALS — BP 100/66 | HR 84 | Ht 67.5 in

## 2015-10-15 DIAGNOSIS — R350 Frequency of micturition: Secondary | ICD-10-CM

## 2015-10-15 DIAGNOSIS — N760 Acute vaginitis: Secondary | ICD-10-CM | POA: Diagnosis not present

## 2015-10-15 DIAGNOSIS — R300 Dysuria: Secondary | ICD-10-CM

## 2015-10-15 DIAGNOSIS — N926 Irregular menstruation, unspecified: Secondary | ICD-10-CM | POA: Diagnosis not present

## 2015-10-15 LAB — POCT URINE PREGNANCY: Preg Test, Ur: NEGATIVE

## 2015-10-15 LAB — POCT URINALYSIS DIPSTICK
Bilirubin, UA: NEGATIVE
Blood, UA: NEGATIVE
Glucose, UA: NEGATIVE
Ketones, UA: NEGATIVE
Leukocytes, UA: NEGATIVE
Nitrite, UA: NEGATIVE
Protein, UA: NEGATIVE
Urobilinogen, UA: NEGATIVE
pH, UA: 5

## 2015-10-15 NOTE — Progress Notes (Signed)
45 y.o.Single Caucasian female G0P0 here with complaint of UTI, with onset on 09/25/15.  she came in on 09/27/15 and urine culture was + E.Coli.  She was treated with Macrobid.  She then felt some better but not well and has continued to have complaints of:  dysuria and urinary urgency. Patient denies fever (but has felt warm), chills.  At times slight nausea and back pain on the left with fatigue. No new personal products. Patient feels not related to sexual activity other than the initial symptoms that occurred over the [place] weekend. Denies vaginal symptoms.    Contraception is none since [facility] test was so low.  No change in partner. Patient has adequate water intake  She is concerned that she had 2 cycles - 09/25/15 X 3 days,  another menses 2/26 for another 3 days.  She noted more cramps, more bloating.  Recently had another HSV outbreak; first one in 5 yrs.   No changes in vaso symptoms   O: Healthy female WDWN Affect: Normal, orientation x 3 Skin : warm and dry CVAT: negative bilateral -but tender on the left flank that is minimal Abdomen: positive for suprapubic tenderness  Pelvic exam: External genital area: normal, no lesions Bladder,Urethra tender, Urethral meatus: normal Vagina: normal vaginal discharge, normal appearance - Affirm is done Cervix: normal, non tender Uterus:normal,non tender Adnexa: normal non tender, no fullness or masses  POCT:  Negative UPT:  Negative   A:  R/O UTI  R/O BV as the cause  Normal pelvic exam  P: Reviewed findings of UTI and need for treatment. Rx:  Will await the results of Affirm and urine micro with [facility] [id number] micro, culture Reviewed warning signs and symptoms of UTI and need to advise if occurring. Encouraged to limit soda, tea, and coffee   RV prn

## 2015-10-15 NOTE — Telephone Encounter (Signed)
Spoke with patient to follow up on UTI symptoms reported on 10/07/2015. She was scheduled for an OV on 10/08/2015, but cancelled this appointment. Patient reports her symptoms are still present and that she is scheduled for an appointment today 10/15/2015 at 4 pm with Kem Boroughs, FNP.   Routing to provider for final review. Patient agreeable to disposition. Will close encounter.

## 2015-10-16 ENCOUNTER — Telehealth: Payer: Self-pay | Admitting: Nurse Practitioner

## 2015-10-16 LAB — URINALYSIS, MICROSCOPIC ONLY
Bacteria, UA: NONE SEEN [HPF]
Casts: NONE SEEN [LPF]
Crystals: NONE SEEN [HPF]
RBC / HPF: NONE SEEN RBC/HPF (ref <=2)
Squamous Epithelial / LPF: NONE SEEN [HPF] (ref <=5)
WBC, UA: NONE SEEN WBC/HPF (ref <=5)
Yeast: NONE SEEN [HPF]

## 2015-10-16 LAB — WET PREP BY MOLECULAR PROBE
Candida species: NEGATIVE
Gardnerella vaginalis: NEGATIVE
Trichomonas vaginosis: NEGATIVE

## 2015-10-16 NOTE — Telephone Encounter (Signed)
Cell phone number went to voice mail.  Per DPR able to leave a detailed message on cell phone:  Message that urine micro was negative, wet prep was negative.  Will hold any treatment for now unless she feels the urinary symptoms are worsening.  Asked her to call back if questions or feels that she needs to start treatment.

## 2015-10-17 LAB — URINE CULTURE: Colony Count: 25000

## 2015-10-18 NOTE — Progress Notes (Signed)
Encounter reviewed by Dr. Brook Amundson C. Silva.  

## 2015-11-15 ENCOUNTER — Other Ambulatory Visit: Payer: Self-pay

## 2015-11-20 ENCOUNTER — Telehealth: Payer: Self-pay | Admitting: Nurse Practitioner

## 2015-11-20 ENCOUNTER — Encounter: Payer: Self-pay | Admitting: Nurse Practitioner

## 2015-11-20 ENCOUNTER — Other Ambulatory Visit: Payer: Self-pay

## 2015-11-20 ENCOUNTER — Ambulatory Visit (INDEPENDENT_AMBULATORY_CARE_PROVIDER_SITE_OTHER): Payer: BLUE CROSS/BLUE SHIELD | Admitting: Nurse Practitioner

## 2015-11-20 VITALS — BP 118/80 | HR 104 | Ht 67.5 in

## 2015-11-20 DIAGNOSIS — N63 Unspecified lump in breast: Secondary | ICD-10-CM | POA: Diagnosis not present

## 2015-11-20 DIAGNOSIS — N6320 Unspecified lump in the left breast, unspecified quadrant: Secondary | ICD-10-CM

## 2015-11-20 DIAGNOSIS — N644 Mastodynia: Secondary | ICD-10-CM | POA: Diagnosis not present

## 2015-11-20 NOTE — Telephone Encounter (Signed)
Patient is calling to speak with nurse. She states the breast center needs a referral for her to have her mammogram done. She states she has a mass.

## 2015-11-20 NOTE — Progress Notes (Signed)
Scheduled patient while in office for left breast diagnostic mammogram with ultrasound on 11/27/2015 at 1 pm with 12:40 pm arrival at the [facility]. She is agreeable to date and time. Placed in mammogram hold.

## 2015-11-20 NOTE — Telephone Encounter (Signed)
Patient returning call. Patient scheduled to see Patty today at 4:00.

## 2015-11-20 NOTE — Telephone Encounter (Signed)
Left message to call [place] at [phone]. Patient will need an OV for a breast check before orders can be placed.

## 2015-11-20 NOTE — Progress Notes (Signed)
   Subjective:   45 y.o. Single Caucasian female presents for evaluation of left breast tenderness with radiation under the nipple. Onset of the symptoms was last pm that awoke her from sleep. Patient sought evaluation because of breast lump and breast tenderness.    She has a history of left breast fibroadenoma removed in 02/2014 and this is in general area of tenderness.  Contributing factors include family hx of colon CA. Denies constitutional symptoms.   Patient denies history of trauma other than lifting weight at the gym 2-3 times a week.  No bites, or injuries. Last mammogram was 03/06/2015.    Previous evaluation has included left fibroadenoma 02/2014 as noted.   She has been having perimenopausal symptoms.  Her LMP was 10/05/15.  She does not feel any other PMS symptoms.  Review of Systems Pertinent items are noted in HPI.SUBJECTIVE   Objective:   General appearance: alert, cooperative, appears stated age and no distress Head: Normocephalic, without obvious abnormality, atraumatic Neck: supple, symmetrical, trachea midline and thyroid not enlarged, symmetric, no tenderness/mass/nodules Back: symmetric, no curvature. ROM normal. No CVA tenderness. Lungs: clear to auscultation bilaterally Breasts: normal appearance, no masses or tenderness, positive findings: implants no mass but she is tender at 11:00 position left breast. Heart: regular rate and rhythm Abdomen: normal findings: no masses palpable    Assessment:   ASSESSMENT:Patient is diagnosed with fibrocystic changes and with tenderness.   Plan:   PLAN: The patient has a documented plan to follow with further care of diagnostic MMG

## 2015-11-20 NOTE — Telephone Encounter (Signed)
Routing to Patricia Grubb, FNP as FYI. Will close encounter. 

## 2015-11-22 NOTE — Progress Notes (Signed)
Reviewed personally.  M. Suzanne Jull Harral, MD.  

## 2015-11-27 ENCOUNTER — Ambulatory Visit
Admission: RE | Admit: 2015-11-27 | Discharge: 2015-11-27 | Disposition: A | Discharge status: Home / Self Care | Payer: BLUE CROSS/BLUE SHIELD | Source: Ambulatory Visit | Attending: Nurse Practitioner | Admitting: Nurse Practitioner

## 2015-11-27 DIAGNOSIS — N6320 Unspecified lump in the left breast, unspecified quadrant: Secondary | ICD-10-CM

## 2015-11-27 DIAGNOSIS — N644 Mastodynia: Secondary | ICD-10-CM

## 2016-01-01 ENCOUNTER — Other Ambulatory Visit: Payer: Self-pay | Admitting: Nurse Practitioner

## 2016-01-01 MED ORDER — FLUCONAZOLE 150 MG PO TABS: 150.0000 mg | ORAL_TABLET | Freq: Once | ORAL | Status: DC

## 2016-01-01 NOTE — Telephone Encounter (Signed)
Medication refill request: Fluconazole 150mg  Last AEX:  02/27/15 PG / 10/15/15 Urinary frequency/Vaginitis Next AEX: none scheduled Last MMG (if hormonal medication request): n/a Refill authorized: 10/15/15 #2 w/1 refill; today please advise

## 2016-02-10 ENCOUNTER — Other Ambulatory Visit: Payer: Self-pay

## 2016-02-10 MED ORDER — VALACYCLOVIR HCL 1 G PO TABS: ORAL_TABLET | ORAL | Status: DC

## 2016-02-10 NOTE — Telephone Encounter (Signed)
Medication refill request: valACYclovir 1000mg  Last AEX:  02/27/15 PG; 11/20/15 office visit for left breast mass Next AEX: none scheduled Last MMG (if hormonal medication request): see EPIC if needed Refill authorized: 02/27/15 #30 w/11 refills; today please advise

## 2016-04-03 ENCOUNTER — Encounter: Payer: Self-pay | Admitting: Nurse Practitioner

## 2016-04-03 ENCOUNTER — Ambulatory Visit (INDEPENDENT_AMBULATORY_CARE_PROVIDER_SITE_OTHER): Payer: BLUE CROSS/BLUE SHIELD | Admitting: Nurse Practitioner

## 2016-04-03 VITALS — BP 112/64 | HR 64 | Ht 67.5 in | Wt 164.0 lb

## 2016-04-03 DIAGNOSIS — Z01419 Encounter for gynecological examination (general) (routine) without abnormal findings: Secondary | ICD-10-CM

## 2016-04-03 DIAGNOSIS — N760 Acute vaginitis: Secondary | ICD-10-CM | POA: Diagnosis not present

## 2016-04-03 DIAGNOSIS — Z0000 Encounter for general adult medical examination without abnormal findings: Secondary | ICD-10-CM

## 2016-04-03 LAB — COMPREHENSIVE METABOLIC PANEL
ALT: 15 U/L (ref 6–29)
AST: 18 U/L (ref 10–35)
Albumin: 4.3 g/dL (ref 3.6–5.1)
Alkaline Phosphatase: 57 U/L (ref 33–115)
BUN: 9 mg/dL (ref 7–25)
CO2: 27 mmol/L (ref 20–31)
Calcium: 9.7 mg/dL (ref 8.6–10.2)
Chloride: 102 mmol/L (ref 98–110)
Creat: 0.79 mg/dL (ref 0.50–1.10)
Glucose, Bld: 92 mg/dL (ref 65–99)
Potassium: 4 mmol/L (ref 3.5–5.3)
Sodium: 140 mmol/L (ref 135–146)
Total Bilirubin: 0.7 mg/dL (ref 0.2–1.2)
Total Protein: 6.9 g/dL (ref 6.1–8.1)

## 2016-04-03 LAB — POCT URINALYSIS DIPSTICK
Bilirubin, UA: NEGATIVE
Blood, UA: NEGATIVE
Glucose, UA: NEGATIVE
Ketones, UA: NEGATIVE
Leukocytes, UA: NEGATIVE
Nitrite, UA: NEGATIVE
Protein, UA: NEGATIVE
Urobilinogen, UA: NEGATIVE
pH, UA: 5

## 2016-04-03 LAB — TSH: TSH: 1.95 mIU/L

## 2016-04-03 MED ORDER — VALACYCLOVIR HCL 1 G PO TABS: ORAL_TABLET | ORAL | 11 refills | Status: DC

## 2016-04-03 NOTE — Patient Instructions (Signed)

## 2016-04-03 NOTE — Progress Notes (Signed)
Reviewed personally.  M. Suzanne Rockland Kotarski, MD.  

## 2016-04-03 NOTE — Progress Notes (Signed)
Patient ID: [name] [name], female   DOB: 12-08-1970, 45 y.o.   MRN: [id number]  45 y.o. G0P0000 Single  Caucasian Fe here for annual exam.  Menses still irregular at 25-30 days.  Last from 1-3 days. Some vaso symptoms that are tolerable.  Work with increased stress with changes within the system.  Working longer hours.  Maternal grandmother age 38 with multiple health issues and followed by Hospice.  Recent yeast infection symptoms and maybe increase in urinary urgency.  She is quite frustrated that she has gained some weight.    She is not using anything for birth control since her AMH was so low last July.   Patient's last menstrual period was 04/03/2016 (exact date).          Sexually active: Yes.    The current method of family planning is none.    Exercising: sporadic, but unable to do regular exercise due to work schedule Smoker:  no  Health Maintenance: Pap:02/27/15, Negative with neg HR HPV MMG: 03/06/15 Bilateral Screening; 11/27/15 3D Left Diagnostic with Ultrasound, Bi-Rads 2: Benign; Return to regular screening schedule in 02/2016 Colonoscopy: 2010 due to ?IBS/constipation; normal; repeat in 10 years (age 29) BMD:2011, heel, normal TDaP:11/25/07 HIV: 02/12/15 Labs: HB: 13.2   Urine: Negative   reports that she has never smoked. She has never used smokeless tobacco. She reports that she does not drink alcohol or use drugs.  Past Medical History:  Diagnosis Date  . Abnormal Pap smear, atypical squamous cells of undetermined sign (ASC-US) 10/98   CIN 1  . Condyloma acuminata 2001  . Endometriosis 8/95   DepoLupron 4/96-10/96  . Hiatal hernia   . History of campylobacteriosis   . HSV-2 infection   . IBS (irritable bowel syndrome)   . Internal thrombosed hemorrhoids   . Migraine    without aura    Past Surgical History:  Procedure Laterality Date  . BREAST ENHANCEMENT SURGERY  02/2000  . BREAST MASS EXCISION Right 12/01   fibroadenoma  . EXPLORATORY LAPAROTOMY  8/95    endometriosis    Current Outpatient Prescriptions  Medication Sig Dispense Refill  . fluticasone (FLONASE) 50 MCG/ACT nasal spray Place into both nostrils daily.    . pseudoephedrine (SUDAFED) 30 MG tablet Take 30 mg by mouth every 4 (four) hours as needed for congestion.    . valACYclovir (VALTREX) 1000 MG tablet TAKE 1 TABLET BY MOUTH DAILY AND 2 TABLETS PER DAY IS FLARE UP FOR 3 DAYS 30 tablet 11   No current facility-administered medications for this visit.     Family History  Problem Relation Age of Onset  . Irritable bowel syndrome Mother   . Hernia Mother   . Ulcers Maternal Grandfather     gastrectomy  . Leukemia Maternal Grandfather   . Diabetes Maternal Grandfather   . Diabetes Maternal Uncle   . Diabetes Maternal Grandmother   . Colon cancer Neg Hx     ROS:  Pertinent items are noted in HPI.  Otherwise, a comprehensive ROS was negative.  Exam:   BP 112/64 (BP Location: Right Arm, Patient Position: Sitting, Cuff Size: Large)   Pulse 64   Ht 5' 7.5" (1.715 m)   Wt 164 lb (74.4 kg)   LMP 04/03/2016 (Exact Date)   BMI 25.31 kg/m  Height: 5' 7.5" (171.5 cm) Ht Readings from Last 3 Encounters:  04/03/16 5' 7.5" (1.715 m)  11/20/15 5' 7.5" (1.715 m)  10/15/15 5' 7.5" (1.715 m)  General appearance: alert, cooperative and appears stated age Head: Normocephalic, without obvious abnormality, atraumatic Neck: no adenopathy, supple, symmetrical, trachea midline and thyroid normal to inspection and palpation Lungs: clear to auscultation bilaterally Breasts: normal appearance, no masses or tenderness, positive findings: implants bilaterally  Heart: regular rate and rhythm Abdomen: soft, non-tender; no masses,  no organomegaly Extremities: extremities normal, atraumatic, no cyanosis or edema Skin: Skin color, texture, turgor normal. No rashes or lesions Lymph nodes: Cervical, supraclavicular, and axillary nodes normal. No abnormal inguinal nodes  palpated Neurologic: Grossly normal   Pelvic: External genitalia:  no lesions              Urethra:  normal appearing urethra with no masses, tenderness or lesions              Bartholin's and Skene's: normal                 Vagina: normal appearing vagina with normal color and moderate menses discharge, no lesions              Cervix: anteverted              Pap taken: Yes.  per pt request Bimanual Exam:  Uterus:  normal size, contour, position, consistency, mobility, non-tender              Adnexa: no mass, fullness, tenderness               Rectovaginal: Confirms               Anus:  normal sphincter tone, no lesions  Chaperone present: yes  A:  Well Woman with normal exam              Perimenopausal C/W irregular menses  History of endometriosis - off OCP since January 2015              Slight weight gain and does not want OCP for cycle regulation   AMH =  0.07 @ 02/2015              Situational anxiety - work and grandmother               R/O yeast vaginitis or UTI               Remote history of CIN I 1998               History of HSV    P:   Reviewed health and wellness pertinent to exam  Pap smear was done  Mammogram is due now and will schedule  Refill on Valtrex for a year  Will follow with Affirm and labs  Counseled on breast self exam, mammography screening, adequate intake of calcium and vitamin D, diet and exercise return annually or prn  An After Visit Summary was printed and given to the patient.

## 2016-04-04 LAB — WET PREP BY MOLECULAR PROBE
Candida species: NEGATIVE
Gardnerella vaginalis: NEGATIVE
Trichomonas vaginosis: NEGATIVE

## 2016-04-04 LAB — HEMOGLOBIN A1C
Hgb A1c MFr Bld: 4.8 % (ref <5.7)
Mean Plasma Glucose: 91 mg/dL

## 2016-04-04 LAB — VITAMIN D 25 HYDROXY (VIT D DEFICIENCY, FRACTURES): Vit D, 25-Hydroxy: 46 ng/mL (ref 30–100)

## 2016-04-05 LAB — URINE CULTURE: Organism ID, Bacteria: NO GROWTH

## 2016-04-06 ENCOUNTER — Telehealth: Payer: Self-pay | Admitting: Nurse Practitioner

## 2016-04-06 ENCOUNTER — Other Ambulatory Visit: Payer: Self-pay | Admitting: Nurse Practitioner

## 2016-04-06 DIAGNOSIS — Z1231 Encounter for screening mammogram for malignant neoplasm of breast: Secondary | ICD-10-CM

## 2016-04-06 LAB — HEMOGLOBIN, FINGERSTICK: Hemoglobin, fingerstick: 13.2 g/dL (ref 12.0–16.0)

## 2016-04-06 NOTE — Telephone Encounter (Signed)
Routing to Kem Boroughs, FNP for review and advise.

## 2016-04-06 NOTE — Telephone Encounter (Signed)
Her Affirm test was negative so no RX was sent in.  However she does have a chronic history of yeast vaginitis.  OK for Diflucan if she feels symptoms have worsened.

## 2016-04-06 NOTE — Telephone Encounter (Signed)
Patient called and said, "I saw [name] [name] this week and she was supposed to call in a prescription to [facility] [address] [facility] [place] but they don't have it."

## 2016-04-07 ENCOUNTER — Encounter: Payer: Self-pay | Admitting: Nurse Practitioner

## 2016-04-07 ENCOUNTER — Ambulatory Visit (INDEPENDENT_AMBULATORY_CARE_PROVIDER_SITE_OTHER): Payer: BLUE CROSS/BLUE SHIELD | Admitting: Nurse Practitioner

## 2016-04-07 VITALS — BP 112/64 | Wt 164.0 lb

## 2016-04-07 DIAGNOSIS — T192XXA Foreign body in vulva and vagina, initial encounter: Secondary | ICD-10-CM

## 2016-04-07 LAB — IPS PAP TEST WITH HPV

## 2016-04-07 MED ORDER — FLUCONAZOLE 150 MG PO TABS: 150.0000 mg | ORAL_TABLET | Freq: Once | ORAL | 0 refills | Status: DC

## 2016-04-07 NOTE — Progress Notes (Signed)
45 y.o. Single Caucasian female G0P0000 here for removal of retained tampon secondary to the string coming off.  LMP 04/03/16.  She was here for AEX on 04/03/16.  There is no change in history in the interim.  Now that cycle is ending she is feeling a lot more vaginal itching.    O: Healthy WD,WN female Affect: normal, no distress Abdomen: soft and non tender Pelvic exam: on visual exam no tampon is present. Small amount of brown vaginal discharge.  There is a scratch place at the introitus where she was trying to remove the tampon.  On bimanual exam there is no tampon.  A: Suspected retained tampon  History of chronic yeast    P:  Discussed findings of no tampon.  She is given a refill on her Diflucan since she now feels the yeast symptoms are back.  The Affirm test was negative on Friday - but also on her cycle.  She has chronic yeast anyway.    RV

## 2016-04-07 NOTE — Telephone Encounter (Signed)
Patient is coming in to be seen today at 11:15 am with Kem Boroughs, FNP. Will hold of on sending in rx until the patient is seen for evaluation.  Routing to provider for final review. Patient agreeable to disposition. Will close encounter.

## 2016-04-08 NOTE — Progress Notes (Signed)
Reviewed personally.  M. Suzanne Patric Vanpelt, MD.  

## 2016-04-14 ENCOUNTER — Ambulatory Visit
Admission: RE | Admit: 2016-04-14 | Discharge: 2016-04-14 | Disposition: A | Discharge status: Home / Self Care | Payer: BLUE CROSS/BLUE SHIELD | Source: Ambulatory Visit | Attending: Nurse Practitioner | Admitting: Nurse Practitioner

## 2016-04-14 DIAGNOSIS — Z1231 Encounter for screening mammogram for malignant neoplasm of breast: Secondary | ICD-10-CM

## 2016-04-20 ENCOUNTER — Other Ambulatory Visit: Payer: Self-pay | Admitting: Nurse Practitioner

## 2016-04-20 NOTE — Telephone Encounter (Signed)
Medication refill request: Diflucan Last AEX:  04/03/16 PG Next AEX: Not scheduled Last MMG (if hormonal medication request): 04/14/16 BIRADS1 Refill authorized: 04/03/16 #2 1R. Please advise. Thank you.

## 2016-05-06 ENCOUNTER — Other Ambulatory Visit: Payer: Self-pay | Admitting: Nurse Practitioner

## 2016-05-06 NOTE — Telephone Encounter (Signed)
Medication refill request: diflucan  Last AEX:  04/03/16 PG Next AEX: none  Last MMG (if hormonal medication request): 04/14/16 BIRADS1:neg  Refill authorized: 04/20/16 #2tabs/0R. Today please advise.

## 2016-07-17 ENCOUNTER — Ambulatory Visit (INDEPENDENT_AMBULATORY_CARE_PROVIDER_SITE_OTHER): Payer: BLUE CROSS/BLUE SHIELD | Admitting: Nurse Practitioner

## 2016-07-17 ENCOUNTER — Encounter: Payer: Self-pay | Admitting: Nurse Practitioner

## 2016-07-17 VITALS — BP 124/80 | HR 64 | Ht 67.5 in

## 2016-07-17 DIAGNOSIS — N760 Acute vaginitis: Secondary | ICD-10-CM | POA: Diagnosis not present

## 2016-07-17 DIAGNOSIS — R350 Frequency of micturition: Secondary | ICD-10-CM

## 2016-07-17 MED ORDER — FLUCONAZOLE 150 MG PO TABS: 150.0000 mg | ORAL_TABLET | Freq: Once | ORAL | 0 refills | Status: AC

## 2016-07-17 NOTE — Patient Instructions (Signed)
Urinary Tract Infection, Adult Introduction A urinary tract infection (UTI) is an infection of any part of the urinary tract. The urinary tract includes the:  Kidneys.  Ureters.  Bladder.  Urethra. These organs make, store, and get rid of pee (urine) in the body. Follow these instructions at home:  Take over-the-counter and prescription medicines only as told by your doctor.  If you were prescribed an antibiotic medicine, take it as told by your doctor. Do not stop taking the antibiotic even if you start to feel better.  Avoid the following drinks:  Alcohol.  Caffeine.  Tea.  Carbonated drinks.  Drink enough fluid to keep your pee clear or pale yellow.  Keep all follow-up visits as told by your doctor. This is important.  Make sure to:  Empty your bladder often and completely. Do not to hold pee for long periods of time.  Empty your bladder before and after sex.  Wipe from front to back after a bowel movement if you are female. Use each tissue one time when you wipe. Contact a doctor if:  You have back pain.  You have a fever.  You feel sick to your stomach (nauseous).  You throw up (vomit).  Your symptoms do not get better after 3 days.  Your symptoms go away and then come back. Get help right away if:  You have very bad back pain.  You have very bad lower belly (abdominal) pain.  You are throwing up and cannot keep down any medicines or water. This information is not intended to replace advice given to you by your health care provider. Make sure you discuss any questions you have with your health care provider. Document Released: 01/13/2008 Document Revised: 01/02/2016 Document Reviewed: 06/17/2015  2017 Elsevier  

## 2016-07-17 NOTE — Progress Notes (Signed)
Patient ID: [name] [name], female   DOB: [birth year], 45 y.o.   MRN: [id number]  45 y.o. Single Caucasian female G0P0000 here with complaint of vaginal symptoms of itching, and increase discharge. Describes discharge as thick yellow to white.    Onset of symptoms 14 days ago. Denies new personal products or vaginal dryness except tried different detergents due to rash or irritation in the groin.  No STD concerns - now engaged X 2 weeks.  Seems to be happy about that - still concerned that she may be a "[employer]". Urinary symptoms increase urination . Contraception is none.  LMP:  07/03/16  With light flow X 2 days.   O:  Healthy female WDWN Affect: normal, orientation x 3  Exam: no distress Abdomen: soft, non tender Lymph node: no enlargement or tenderness Pelvic exam: External genital: normal female with multiple areas of shave irritations BUS: negative Vagina: white thick discharge noted.  Affirm taken. Cervix: normal, non tender, no CMT Uterus: normal, non tender Adnexa:normal, non tender, no masses or fullness noted    A: Vaginitis  Superficial shave bumps with irritation  Urinary frequency - R/O UTI   P: Discussed findings of vaginitis and etiology. Discussed Aveeno or baking soda sitz bath for comfort. Avoid moist clothes or pads for extended period of time. If working out in gym clothes or swim suits for long periods of time change underwear or bottoms of swimsuit if possible. Olive Oil/Coconut Oil use for skin protection prior to activity can be used to external skin.  Rx: Diflucan 150 mg X 2   Will send urine for C&S and follow  Follow with Affirm  RV prn

## 2016-07-18 LAB — WET PREP BY MOLECULAR PROBE
Candida species: NEGATIVE
Gardnerella vaginalis: NEGATIVE
Trichomonas vaginosis: NEGATIVE

## 2016-07-19 LAB — URINE CULTURE: Organism ID, Bacteria: NO GROWTH

## 2016-07-19 NOTE — Progress Notes (Signed)
Encounter reviewed by Dr. Brook Amundson C. Silva.  

## 2016-07-20 LAB — POCT URINALYSIS DIPSTICK
Bilirubin, UA: NEGATIVE
Blood, UA: NEGATIVE
Glucose, UA: NEGATIVE
Ketones, UA: NEGATIVE
Leukocytes, UA: NEGATIVE
Nitrite, UA: NEGATIVE
Protein, UA: NEGATIVE
Urobilinogen, UA: NEGATIVE
pH, UA: 6

## 2016-08-04 ENCOUNTER — Other Ambulatory Visit: Payer: Self-pay | Admitting: Nurse Practitioner

## 2016-08-07 ENCOUNTER — Telehealth: Payer: Self-pay | Admitting: *Deleted

## 2016-08-07 MED ORDER — FLUCONAZOLE 150 MG PO TABS: 150.0000 mg | ORAL_TABLET | Freq: Once | ORAL | 0 refills | Status: AC

## 2016-08-07 NOTE — Telephone Encounter (Signed)
She has chronic yeast -ok to give Diflucan 150 mg X 2.

## 2016-08-07 NOTE — Telephone Encounter (Signed)
Spoke with patient, advised as seen below per Patricia Grubb, NP. Patient verbalizes understanding and is agreeable.  Routing to provider for final review. Patient is agreeable to disposition. Will close encounter.  

## 2016-08-07 NOTE — Telephone Encounter (Signed)
Spoke with patient. Patient states she has chronic yeast infections and she is not sure why. Patient states symptoms started 2 weeks ago. Patient reports white vaginal discharge and itching. Patient denies any urinary complaints. Patient request diflucan to pharmacy on file. Advised patient would review with Kem Boroughs, NP and return call with recommendations. Patient is agreeable, states to leave message if unable to reach.   Kem Boroughs, NP please advise on diflucan?

## 2016-08-07 NOTE — Telephone Encounter (Signed)
Spoke with patients mother "[name] [name]", ok per current dpr. Mother calling stating daughter is really sick with a bad sinus infection and possible yeast infection and would like to know if something can be called in? RN asked if mother could describe symptoms that the patient was having and when they started? Mother states those are private questions that you would need to ask my daughter. RN asked if daughter is available and mother states you may try to reach her by mobil number. RN advised mother will attempt to call patient for further evaluation.   Call to patient at [phone], Left message to call Sharee Pimple at [phone].

## 2016-08-20 ENCOUNTER — Telehealth: Payer: Self-pay | Admitting: *Deleted

## 2016-08-20 NOTE — Telephone Encounter (Signed)
Patient has not reviewed labs via MyChart so phone call to notify of results.  While on phone patient states she feels like she still has yeast symptoms.  Patient took one Diflucan after last visit, but lost second pill down drain when she went to take it.  Patient [facility].  Cecille Rubin is aware Edman Circle, FNP is out of office today.

## 2016-08-20 NOTE — Telephone Encounter (Signed)
-----   Message from Kem Boroughs, [place] sent at 07/19/2016 10:42 AM EST ----- Results via my chart:  [place].   The Urine culture and the vaginal wet prep were both negative. finish the Diflucan for the external symptoms.  Have a wonderful day!

## 2016-08-21 MED ORDER — FLUCONAZOLE 150 MG PO TABS: 150.0000 mg | ORAL_TABLET | Freq: Once | ORAL | 0 refills | Status: DC

## 2016-08-21 NOTE — Telephone Encounter (Signed)
She may have a refill on Diflucan 150 mg X 2.

## 2016-08-21 NOTE — Telephone Encounter (Signed)
Patient notified diflucan has been sent to pharmacy.  To call with further symptoms.

## 2016-09-22 ENCOUNTER — Telehealth: Payer: Self-pay | Admitting: Nurse Practitioner

## 2016-09-22 ENCOUNTER — Telehealth: Payer: Self-pay | Admitting: Certified Nurse Midwife

## 2016-09-22 NOTE — Telephone Encounter (Signed)
error 

## 2016-09-22 NOTE — Telephone Encounter (Signed)
Patient states she is 2 weeks late for her period, she took a pregnancy test and it was negative.  Wants to come in and have a urine  and a blood pregnancy test done too.

## 2016-09-22 NOTE — Telephone Encounter (Signed)
She has been irregular like this in the past.  Have her to monitor cycles as this is not uncommon in perimenopause.  If no menses in 3 months to call back.

## 2016-09-22 NOTE — Telephone Encounter (Signed)
Spoke with patient. Patient states that her menses is two weeks late. Took a UPT that was negative. Reports she has been having monthly cycles that have been varying I length. Cycles have been shorter or longer than normal. Patient would like [name] Boroughs, FNP's recommendations on if she should monitor her cycles or if she needs to come in for lab work at this time. Advised will review with [name] Boroughs, FNP and return call with further recommendations. Patient is agreeable.

## 2016-09-22 NOTE — Telephone Encounter (Signed)
Spoke with patient. Advised of message as seen below from Patricia Grubb, FNP. Patient is agreeable and verbalizes understanding.  Routing to provider for final review. Patient agreeable to disposition. Will close encounter.  

## 2016-12-10 ENCOUNTER — Other Ambulatory Visit: Payer: Self-pay | Admitting: Nurse Practitioner

## 2016-12-11 NOTE — Telephone Encounter (Signed)
Medication refill request: Fluconazole Last AEX:  04/03/16 PG Next AEX: Not scheduled  Last MMG (if hormonal medication request): 04/14/16 BIRADS1, [facility] Refill authorized: 08/21/16 #2 0R. Please advise. Thank you.

## 2016-12-21 ENCOUNTER — Other Ambulatory Visit: Payer: Self-pay

## 2016-12-21 MED ORDER — FLUCONAZOLE 150 MG PO TABS: ORAL_TABLET | ORAL | 0 refills | Status: DC

## 2016-12-21 NOTE — Telephone Encounter (Signed)
Medication refill request: Fluconazole Last AEX:  04/03/16 PG Next AEX: Not scheduled  Last MMG (if hormonal medication request): 04/14/16 BIRADS1, [facility] Refill authorized: 08/21/16 #2 0R. Please advise. Thank you.

## 2016-12-25 ENCOUNTER — Ambulatory Visit: Payer: BLUE CROSS/BLUE SHIELD | Admitting: Nurse Practitioner

## 2016-12-25 ENCOUNTER — Telehealth: Payer: Self-pay | Admitting: Nurse Practitioner

## 2016-12-25 NOTE — Telephone Encounter (Signed)
Patient rescheduled her appointment today for "recheck area on leg and vagina" today due to death in the family. Patient rescheduled to 01/01/17 at 3;00pm.

## 2017-01-01 ENCOUNTER — Encounter: Payer: Self-pay | Admitting: Nurse Practitioner

## 2017-01-01 ENCOUNTER — Ambulatory Visit (INDEPENDENT_AMBULATORY_CARE_PROVIDER_SITE_OTHER): Payer: BLUE CROSS/BLUE SHIELD | Admitting: Nurse Practitioner

## 2017-01-01 VITALS — BP 120/74 | HR 64 | Ht 67.5 in | Wt 164.0 lb

## 2017-01-01 DIAGNOSIS — N9089 Other specified noninflammatory disorders of vulva and perineum: Secondary | ICD-10-CM

## 2017-01-01 NOTE — Patient Instructions (Signed)
Apt for biopsy will be scheduled.

## 2017-01-01 NOTE — Progress Notes (Signed)
GYNECOLOGY  VISIT   HPI: 46 y.o.   Single  Caucasian  female  G0P0000 with concerns about recurrence on condyloma.  She had a vulvar biopsy done on 07/2014 showing condyloma.  On and off has had some concerns but nothing was really seen.  Areas have not felt raised.  Now there are 2 other lesions that could be skin tag but with her history she is worried about recurrence of condyloma.   She is also very stressed about the loss of MGM this week.  She has a rash on both arms, legs and buttocks that is causing a lot of itching.  Not sleeping well and feels that her nerves are 'shot'.  Has taken Benadryl prn without help.  GYNECOLOGIC HISTORY: Patient's last menstrual period was 12/29/2016 (approximate). Contraception:  None (AMH is very low)         OB History    Gravida Para Term Preterm AB Living   0 0 0 0 0 0   SAB TAB Ectopic Multiple Live Births   0 0 0 0 0         Patient Active Problem List   Diagnosis Date Noted  . INFECTIOUS COLITIS ENTERITIS AND GASTROENTERITIS 05/14/2009  . CONSTIPATION 05/14/2009  . IRRITABLE BOWEL SYNDROME 05/14/2009  . INTERNAL THROMBOSED HEMORRHOIDS 12/31/2007    Past Medical History:  Diagnosis Date  . Abnormal Pap smear, atypical squamous cells of undetermined sign (ASC-US) 10/98   CIN 1  . Condyloma acuminata 2001  . Endometriosis 8/95   DepoLupron 4/96-10/96  . Hiatal hernia   . History of campylobacteriosis   . HSV-2 infection   . IBS (irritable bowel syndrome)   . Internal thrombosed hemorrhoids   . Migraine    without aura    Past Surgical History:  Procedure Laterality Date  . BREAST ENHANCEMENT SURGERY  02/2000  . BREAST MASS EXCISION Right 12/01   fibroadenoma  . EXPLORATORY LAPAROTOMY  8/95   endometriosis    Current Outpatient Prescriptions  Medication Sig Dispense Refill  . valACYclovir (VALTREX) 1000 MG tablet TAKE 1 TABLET BY MOUTH DAILY AND 2 TABLETS PER DAY IS FLARE UP FOR 3 DAYS 30 tablet 11   No current  facility-administered medications for this visit.      ALLERGIES: Aspirin; Doxycycline; Latex; and Sulfonamide derivatives  Family History  Problem Relation Age of Onset  . Irritable bowel syndrome Mother   . Hernia Mother   . Ulcers Maternal Grandfather        gastrectomy  . Leukemia Maternal Grandfather   . Diabetes Maternal Grandfather   . Prostate cancer Maternal Grandfather   . Diabetes Maternal Uncle   . Diabetes Maternal Grandmother   . Coronary artery disease Maternal Grandmother   . Stroke Maternal Grandmother   . Heart failure Maternal Grandmother   . Colon cancer Neg Hx     Social History   Social History  . Marital status: Single    Spouse name: N/A  . Number of children: 0  . Years of education: N/A   Occupational History  .  [employer]   Social History Main Topics  . Smoking status: Never Smoker  . Smokeless tobacco: Never Used  . Alcohol use No     Comment: occasionally  . Drug use: No  . Sexual activity: Yes    Birth control/ protection: None   Other Topics Concern  . Not on file   Social History Narrative  . No narrative on file  ROS  PHYSICAL EXAMINATION:    BP 120/74 (BP Location: Right Arm, Patient Position: Sitting, Cuff Size: Normal)   Pulse 64   Ht 5' 7.5" (1.715 m)   Wt 164 lb (74.4 kg)   LMP 12/29/2016 (Approximate)   BMI 25.31 kg/m     General appearance: alert, cooperative and appears stated age Skin:  Macular rash under the skin without vesicles. Rash is not scabies.  Could be allergic in nature.  Pelvic: External genitalia:   Lesions at the introitus at 6:00 position with some discoloration but not rough.  Area is white with atrophic changes.  2 other lesion but not sure if condyloma              Vagina:  Tampon in place                ASSESSMENT  R/O condyloma  History of condyloma 12/15  Rash ? Etiology  Grief reaction with loss of MGM     PLAN  Condolence is given  Discussed options for treatment  including Aldar - she wanted things "taken off" instead of medication therapy   Will take Zyrtec prn for rash  Will plan on another visit with Dr. Sabra Heck as she may need another colpo and or biopsy.   Order is place for biopsy - not for colpo as not sure if needed.

## 2017-01-02 NOTE — Progress Notes (Signed)
Reviewed personally.  M. Suzanne Jkwon Treptow, MD.  

## 2017-01-06 ENCOUNTER — Telehealth: Payer: Self-pay | Admitting: Nurse Practitioner

## 2017-01-06 NOTE — Telephone Encounter (Signed)
Patient calling to check if insurance precert has been done for a procedure with Dr. Sabra Heck.

## 2017-01-07 NOTE — Telephone Encounter (Signed)
Patient returned call. Reviewed benefit for vulvar biospy. Patient understood and agreeable. Patient ready to schedule. Patient scheduled 01/08/17 with Dr Sabra Heck. Patient aware of  Date, arrival time and cancellation policy. Patient had no further questions.   Routing to Dr Sabra Heck  cc: Kem Boroughs, FNP

## 2017-01-07 NOTE — Telephone Encounter (Signed)
Call placed to patient to review benefits for a recommended procedure. Left Voicemail requesting a call back.

## 2017-01-08 ENCOUNTER — Ambulatory Visit (INDEPENDENT_AMBULATORY_CARE_PROVIDER_SITE_OTHER): Payer: BLUE CROSS/BLUE SHIELD | Admitting: Obstetrics & Gynecology

## 2017-01-08 ENCOUNTER — Encounter: Payer: Self-pay | Admitting: Obstetrics & Gynecology

## 2017-01-08 DIAGNOSIS — N9089 Other specified noninflammatory disorders of vulva and perineum: Secondary | ICD-10-CM

## 2017-01-08 NOTE — Progress Notes (Signed)
GYNECOLOGY  VISIT   HPI: 46 y.o. G33P0000 Engaged Caucasian female here for vulvar biopsy/excision of several vulvar lesions.  Pt has been diagnosed with condyloma in the past and would like the areas removed even if I don't think condyloma is the cause.  She was seen recently by Edman Circle on 01/01/17.  Denies vulvar itching or pain.  GYNECOLOGIC HISTORY: Patient's last menstrual period was 12/29/2016 (approximate). Contraception: none Menopausal hormone therapy: none  Patient Active Problem List   Diagnosis Date Noted  . INFECTIOUS COLITIS ENTERITIS AND GASTROENTERITIS 05/14/2009  . CONSTIPATION 05/14/2009  . IRRITABLE BOWEL SYNDROME 05/14/2009  . INTERNAL THROMBOSED HEMORRHOIDS 12/31/2007    Past Medical History:  Diagnosis Date  . Abnormal Pap smear, atypical squamous cells of undetermined sign (ASC-US) 10/98   CIN 1  . Condyloma acuminata 2001  . Endometriosis 8/95   DepoLupron 4/96-10/96  . Hiatal hernia   . History of campylobacteriosis   . HSV-2 infection   . IBS (irritable bowel syndrome)   . Internal thrombosed hemorrhoids   . Migraine    without aura    Past Surgical History:  Procedure Laterality Date  . BREAST ENHANCEMENT SURGERY  02/2000  . BREAST MASS EXCISION Right 12/01   fibroadenoma  . EXPLORATORY LAPAROTOMY  8/95   endometriosis    MEDS:  Reviewed in EPIC and UTD  ALLERGIES: Aspirin; Doxycycline; Latex; and Sulfonamide derivatives  Family History  Problem Relation Age of Onset  . Irritable bowel syndrome Mother   . Hernia Mother   . Ulcers Maternal Grandfather        gastrectomy  . Leukemia Maternal Grandfather   . Diabetes Maternal Grandfather   . Prostate cancer Maternal Grandfather   . Diabetes Maternal Uncle   . Diabetes Maternal Grandmother   . Coronary artery disease Maternal Grandmother   . Stroke Maternal Grandmother   . Heart failure Maternal Grandmother   . Colon cancer Neg Hx     SH:  Engaged, non smoker  Review of Systems   All other systems reviewed and are negative.   PHYSICAL EXAMINATION:    BP 118/70 (BP Location: Right Arm, Patient Position: Sitting, Cuff Size: Large)   Pulse 98   Resp 16   Ht 5' 7.5" (1.715 m)   Wt 164 lb (74.4 kg)   LMP 12/29/2016 (Approximate)   BMI 25.31 kg/m     Physical Exam  Constitutional: She appears well-developed and well-nourished.  Genitourinary:    There is lesion on the right labia. There is no rash, tenderness or injury on the right labia. There is lesion on the left labia. There is no rash, tenderness or injury on the left labia.  Lymphadenopathy:       Right: No inguinal adenopathy present.       Left: No inguinal adenopathy present.  Psychiatric: She has a normal mood and affect.    Procedure:  Areas where two likely skin tags are located were cleansed with Betadine x 3.  Then 0.5% Lidocaine was instilled beneath lesion.  Lesions excised with sterile pick-ups and scissors.  Silver nitrate used to obtained excellent hemostasis.  Both specimens sent together to pathology.  Then area at introitus cleansed with Betadine x 3.  0.5% Lidocaine instilled.  60mm punch biopsy obtained.  Biopsy excised.  Hemostasis achieved with silver nitrate.  Pt tolerated procedure well.  Assessment: Vulvar lesions, s/p biopsy/excision x 2 of findings that appear to be skin tags and 1 that is at introitus  Plan:  Biopsy results will be called to pt and additional recommendations will be made as needed.

## 2017-01-12 ENCOUNTER — Telehealth: Payer: Self-pay | Admitting: Obstetrics & Gynecology

## 2017-01-12 NOTE — Telephone Encounter (Signed)
Missy from [facility] is asking to talk with someone about this patient's biopsy. Missy has a few questions about the specimens.

## 2017-01-12 NOTE — Telephone Encounter (Signed)
Spoke with Missy from [facility] Pathology, would like to confirm specimens collected on 01/08/17 from vulvar biopsy.    - 1st bottle with 2 specimens from left and right labia  - 2nd bottle with 1 specimen, would like to confirm from       Introitus  RN advised from [facility] notes per Dr. Sabra Heck on 01/08/17: Vulvar lesions, s/p biopsy/excision x 2 of findings that appear to be skin tags and 1 that is at introitus. Advised will review with Dr. Sabra Heck and return call with any additional follow-up    Dr. Sabra Heck - can you review and confirm for lab?

## 2017-01-12 NOTE — Telephone Encounter (Signed)
Called personally and confirmed labeling of specimens.  Ok to close encounter.

## 2017-01-14 ENCOUNTER — Telehealth: Payer: Self-pay | Admitting: Nurse Practitioner

## 2017-01-14 NOTE — Telephone Encounter (Signed)
Dr. Sabra Heck, can you review pathology report dated 01/10/17 and advise?

## 2017-01-14 NOTE — Telephone Encounter (Signed)
Patient requesting lab results

## 2017-01-15 NOTE — Telephone Encounter (Signed)
I called the patient on 01/14/17 and left a detailed message as per her DPR.  Pt advised of condyloma and normal tissue.  She was also advised to continue to watch for any new lesions and to call if she saw or felt any.  No follow up needed unless areas don't feel like they are healing correctly.  Ok to close encounter.

## 2017-04-21 ENCOUNTER — Other Ambulatory Visit: Payer: Self-pay

## 2017-04-21 MED ORDER — VALACYCLOVIR HCL 1 G PO TABS: ORAL_TABLET | ORAL | 0 refills | Status: DC

## 2017-04-21 NOTE — Telephone Encounter (Signed)
Medication refill request: Valtrex 1 gram Last AEX:  04/03/16 PG Next AEX: needs to schedule; will call Last MMG (if hormonal medication request): 04/14/16 BIRADS 1 negative/density c Refill authorized: 04/03/16 #30 w/11; today #30 w/0 refills until AEX is scheduled. Please advise

## 2017-04-21 NOTE — Telephone Encounter (Signed)
Tried calling patient to schedule AEX, no answer, left message to call me back at [phone]

## 2017-04-22 ENCOUNTER — Telehealth: Payer: Self-pay

## 2017-04-22 ENCOUNTER — Other Ambulatory Visit: Payer: Self-pay | Admitting: Physician Assistant

## 2017-04-22 DIAGNOSIS — C4492 Squamous cell carcinoma of skin, unspecified: Secondary | ICD-10-CM

## 2017-04-22 HISTORY — DX: Squamous cell carcinoma of skin, unspecified: C44.92

## 2017-04-22 NOTE — Telephone Encounter (Signed)
Tried calling patient to schedule for AEX. No answer, left message for patient to call our office back at [phone]

## 2017-05-03 ENCOUNTER — Other Ambulatory Visit (HOSPITAL_COMMUNITY)
Admission: RE | Admit: 2017-05-03 | Discharge: 2017-05-03 | Disposition: A | Discharge status: Home / Self Care | Payer: BLUE CROSS/BLUE SHIELD | Source: Ambulatory Visit | Attending: Obstetrics & Gynecology | Admitting: Obstetrics & Gynecology

## 2017-05-03 ENCOUNTER — Encounter: Payer: Self-pay | Admitting: Obstetrics & Gynecology

## 2017-05-03 ENCOUNTER — Ambulatory Visit (INDEPENDENT_AMBULATORY_CARE_PROVIDER_SITE_OTHER): Payer: BLUE CROSS/BLUE SHIELD | Admitting: Obstetrics & Gynecology

## 2017-05-03 VITALS — BP 116/72 | HR 72 | Resp 16 | Ht 66.75 in | Wt 159.0 lb

## 2017-05-03 DIAGNOSIS — Z23 Encounter for immunization: Secondary | ICD-10-CM

## 2017-05-03 DIAGNOSIS — Z124 Encounter for screening for malignant neoplasm of cervix: Secondary | ICD-10-CM | POA: Diagnosis not present

## 2017-05-03 DIAGNOSIS — Z01419 Encounter for gynecological examination (general) (routine) without abnormal findings: Secondary | ICD-10-CM | POA: Diagnosis not present

## 2017-05-03 DIAGNOSIS — Z0000 Encounter for general adult medical examination without abnormal findings: Secondary | ICD-10-CM | POA: Diagnosis not present

## 2017-05-03 MED ORDER — VALACYCLOVIR HCL 1 G PO TABS: ORAL_TABLET | ORAL | 13 refills | Status: DC

## 2017-05-03 NOTE — Progress Notes (Signed)
46 y.o. G0P0000 Single Caucasian F here for annual exam.  Cycles are still fairly regular every 3-4 weeks.  Flow lasts no more than four days but sometimes it's heavy to start of with.  This isn't every month.  Clotting has increased this year.    Just got married last month.  Having some issues with libido.  Feels like this is all stress related.    Patient's last menstrual period was 04/19/2017 (within days).          Sexually active: Yes.    The current method of family planning is none.    Exercising: Yes.    walking, running, and weights Smoker:  no  Health Maintenance: Pap:  04/03/16 negative, HR HPV negative, 02/27/15 negative, HR HPV negative  History of abnormal Pap:  Yes, per patient unsure when MMG:  04/14/16 BIRADS 1 negative -- patient will schedule Colonoscopy:  05/30/09 normal- repeat 10 years BMD:   2011 heels test- normal  TDaP:  11/25/07  Pneumonia vaccine(s):  n/a Zostavax:   n/a Hep C testing: not indicated Screening Labs: discuss today   reports that she has never smoked. She has never used smokeless tobacco. She reports that she does not drink alcohol or use drugs.  Past Medical History:  Diagnosis Date  . Abnormal Pap smear, atypical squamous cells of undetermined sign (ASC-US) 10/98   CIN 1  . Condyloma acuminata 2001  . Endometriosis 8/95   DepoLupron 4/96-10/96  . Hiatal hernia   . History of campylobacteriosis   . HSV-2 infection   . IBS (irritable bowel syndrome)   . Internal thrombosed hemorrhoids   . Migraine    without aura    Past Surgical History:  Procedure Laterality Date  . BREAST ENHANCEMENT SURGERY  02/2000  . BREAST MASS EXCISION Right 12/01   fibroadenoma  . EXPLORATORY LAPAROTOMY  8/95   endometriosis    Current Outpatient Prescriptions  Medication Sig Dispense Refill  . levocetirizine (XYZAL) 5 MG tablet Take 5 mg by mouth daily.  2  . valACYclovir (VALTREX) 1000 MG tablet TAKE 1 TABLET BY MOUTH DAILY AND 2 TABLETS PER DAY IS  FLARE UP FOR 3 DAYS 30 tablet 0   No current facility-administered medications for this visit.     Family History  Problem Relation Age of Onset  . Irritable bowel syndrome Mother   . Hernia Mother   . Ulcers Maternal Grandfather        gastrectomy  . Leukemia Maternal Grandfather   . Diabetes Maternal Grandfather   . Prostate cancer Maternal Grandfather   . Diabetes Maternal Uncle   . Diabetes Maternal Grandmother   . Coronary artery disease Maternal Grandmother   . Stroke Maternal Grandmother   . Heart failure Maternal Grandmother   . Colon cancer Neg Hx     ROS:  Pertinent items are noted in HPI.  Otherwise, a comprehensive ROS was negative.  Exam:   BP 116/72 (BP Location: Right Arm, Patient Position: Sitting, Cuff Size: Normal)   Pulse 72   Resp 16   Ht 5' 6.75" (1.695 m)   Wt 159 lb (72.1 kg)   LMP 04/19/2017 (Within Days)   BMI 25.09 kg/m    Height: 5' 6.75" (169.5 cm)  Ht Readings from Last 3 Encounters:  05/03/17 5' 6.75" (1.695 m)  01/08/17 5' 7.5" (1.715 m)  01/01/17 5' 7.5" (1.715 m)    General appearance: alert, cooperative and appears stated age Head: Normocephalic, without obvious abnormality, atraumatic  Neck: no adenopathy, supple, symmetrical, trachea midline and thyroid normal to inspection and palpation Lungs: clear to auscultation bilaterally Breasts: normal appearance, no masses or tenderness, bilateral implants present Heart: regular rate and rhythm Abdomen: soft, non-tender; bowel sounds normal; no masses,  no organomegaly Extremities: extremities normal, atraumatic, no cyanosis or edema Skin: Skin color, texture, turgor normal. No rashes or lesions Lymph nodes: Cervical, supraclavicular, and axillary nodes normal. No abnormal inguinal nodes palpated Neurologic: Grossly normal   Pelvic: External genitalia:  no lesions              Urethra:  normal appearing urethra with no masses, tenderness or lesions              Bartholins and Skenes:  normal                 Vagina: normal appearing vagina with normal color and discharge, no lesions              Cervix: no lesions              Pap taken: Yes.   Bimanual Exam:  Uterus:  normal size, contour, position, consistency, mobility, non-tender              Adnexa: normal adnexa and no mass, fullness, tenderness               Rectovaginal: Confirms               Anus:  normal sphincter tone, no lesions  Chaperone was present for exam.  A:  Well Woman with normal exam Long term infertility with h/o endometriosis, off OCPs since 2015.  Declines hormonal medication H/O vulva condyloma H/O HSV 2  Work stressors Remote hx of CIN 1 1998  P:   Mammogram guidelines reviewed.  Pt will call to schedule pap smear obtained today.  Desires yearly. Will return for lab work:  CBC, CMP, Lipids, TSH, Vit D Update tdap today Rx for Valtrex 1gm daily.  #30/13 RF.  Increased to BID x 3 days with outbreaks return annually or prn

## 2017-05-04 LAB — CYTOLOGY - PAP: Diagnosis: NEGATIVE

## 2017-05-07 ENCOUNTER — Telehealth: Payer: Self-pay | Admitting: Obstetrics & Gynecology

## 2017-05-07 NOTE — Telephone Encounter (Signed)
Call to patient. Patient states that her period started on Tuesday and it has been heavy since starting. Patient states she is changing a saturated super plus tampon every 3 hours, and that there are some blood spots on her pad too. States she has had some pea-sized blood clots. Patient also complaining of dizziness, but denies being light headed or SOB. RN advised patient she would need to be seen in the office for further evaluation by covering provider since Dr. Sabra Heck is out of the office today. Patient states, "I didn't think I would have to come in today. I have a lot of appointments this afternoon that are hard to work around." RN advised with the heavy bleeding she was having and the dizziness office visit recommended. RN offered patient appointment at 3 pm and patient declined, stating she is seeing a couple from out of town at that time. RN offered to review schedule with Clinical Nurse Supervisor and patient again declined stating, "I'll be okay- I'm really in tune with my body and I think the bleeding may be lightening up a hair." Urgent care/ ER precautions reviewed with patient and she verbalized understanding and states if her condition worsened over the weekend she would be seen.   Routing to covering provider for final review. Patient agreeable to disposition. Will close encounter.    CC- Dr. Sabra Heck

## 2017-05-07 NOTE — Telephone Encounter (Signed)
Patient states she was seen earlier this week and that she started her period on Tuesday.  She is still bleeding heavy and she is bleeding through a super plus tampon every couple of hours.  Patient states Dr Sabra Heck told her to call in if she was still on her period that she was going to do something to stop it.

## 2017-11-03 ENCOUNTER — Telehealth: Payer: Self-pay | Admitting: Obstetrics & Gynecology

## 2017-11-03 NOTE — Telephone Encounter (Signed)
Patient is having some abnormal symptoms and would like to come in to be tested to see if she is in menopause.

## 2017-11-03 NOTE — Telephone Encounter (Signed)
Spoke with patient. Newly married, increased work stress, is concerned maybe perimenopausal. Reports decreased libido, emotional, irregular menses, cramping with menses and hot flashes. LMP 10/12/17. Requesting OV.   OV scheduled for 11/09/17 at 10:45am with Dr. Sabra Heck.  Routing to provider for final review. Patient is agreeable to disposition. Will close encounter.

## 2017-11-09 ENCOUNTER — Encounter: Payer: Self-pay | Admitting: Obstetrics & Gynecology

## 2017-11-09 ENCOUNTER — Ambulatory Visit (INDEPENDENT_AMBULATORY_CARE_PROVIDER_SITE_OTHER): Payer: BLUE CROSS/BLUE SHIELD | Admitting: Obstetrics & Gynecology

## 2017-11-09 VITALS — BP 110/80 | HR 84 | Resp 16

## 2017-11-09 DIAGNOSIS — R232 Flushing: Secondary | ICD-10-CM | POA: Diagnosis not present

## 2017-11-09 DIAGNOSIS — G4709 Other insomnia: Secondary | ICD-10-CM

## 2017-11-09 DIAGNOSIS — N926 Irregular menstruation, unspecified: Secondary | ICD-10-CM | POA: Diagnosis not present

## 2017-11-09 LAB — POCT URINE PREGNANCY: Preg Test, Ur: NEGATIVE

## 2017-11-09 NOTE — Progress Notes (Signed)
GYNECOLOGY  VISIT  CC:   Hot flashes, night seats  HPI: 47 y.o. G0P0000 Married Caucasian female here for irregular bleeding.  Cycles have changed over the 9 months.  Bleeding can be every two weeks to four weeks.  When she has a shorter interval between cycles, her flow is heavier and lasts longer.  Flow can be very heavy and have clotting with it.  On the heaviest days, uses super plus tampons.  Will have to change every three hours or will leak through.  Having increased hot flashes and night sweats.  She's having some increased issues with sleep.  Libido is also really low.    Having increase work stressors.  Not exercising as much as she knows she should due to work.  Working longer hours as well.  Does grind her teeth and has been grinding through mouth guards due to stress.    Using no contraception.  Does not desire any at this time.  GYNECOLOGIC HISTORY: Patient's last menstrual period was 11/08/2017. Contraception: none Menopausal hormone therapy: none  Patient Active Problem List   Diagnosis Date Noted  . INFECTIOUS COLITIS ENTERITIS AND GASTROENTERITIS 05/14/2009  . CONSTIPATION 05/14/2009  . IRRITABLE BOWEL SYNDROME 05/14/2009  . INTERNAL THROMBOSED HEMORRHOIDS 12/31/2007    Past Medical History:  Diagnosis Date  . Abnormal Pap smear, atypical squamous cells of undetermined sign (ASC-US) 10/98   CIN 1  . Condyloma acuminata 2001  . Endometriosis 8/95   DepoLupron 4/96-10/96  . Hiatal hernia   . History of campylobacteriosis   . HSV-2 infection   . IBS (irritable bowel syndrome)   . Internal thrombosed hemorrhoids   . Migraine    without aura    Past Surgical History:  Procedure Laterality Date  . BREAST ENHANCEMENT SURGERY  02/2000  . BREAST MASS EXCISION Right 12/01   fibroadenoma  . EXPLORATORY LAPAROTOMY  8/95   endometriosis    MEDS:   Current Outpatient Medications on File Prior to Visit  Medication Sig Dispense Refill  . pseudoephedrine  (SUDAFED) 30 MG tablet Take 30 mg by mouth every 8 (eight) hours as needed for congestion.    . valACYclovir (VALTREX) 1000 MG tablet TAKE 1 TABLET BY MOUTH DAILY AND 2 TABLETS PER DAY IS FLARE UP FOR 3 DAYS 30 tablet 13   No current facility-administered medications on file prior to visit.     ALLERGIES: Aspirin; Doxycycline; Latex; and Sulfonamide derivatives  Family History  Problem Relation Age of Onset  . Irritable bowel syndrome Mother   . Hernia Mother   . Ulcers Maternal Grandfather        gastrectomy  . Leukemia Maternal Grandfather   . Diabetes Maternal Grandfather   . Prostate cancer Maternal Grandfather   . Diabetes Maternal Uncle   . Diabetes Maternal Grandmother   . Coronary artery disease Maternal Grandmother   . Stroke Maternal Grandmother   . Heart failure Maternal Grandmother   . Colon cancer Neg Hx     SH:  Married, occasional smoker  Review of Systems  All other systems reviewed and are negative.   PHYSICAL EXAMINATION:    BP 110/80 (BP Location: Right Arm, Patient Position: Sitting, Cuff Size: Normal)   Pulse 84   Resp 16   LMP 11/08/2017     General appearance: alert, cooperative and appears stated age No exam performed today  Assessment: Irregular cycles Hot flashes Stressors at work Insomnia Decreased libido  Plan: Today is day 2 of cycle.  Will  test Tir[facility] and Estradiol.  If these are normal, her symptoms may all be stress related.  We did discuss treatment options including POPs or IUD with low dosed estrogen patch.  She is not sure she wants to use and IUD and does have some concerns about estrogen use and breast cancer.     ~15 minutes spent with patient >50% of time was in face to face discussion of above.

## 2017-11-10 LAB — ESTRADIOL: Estradiol: 15.3 pg/mL

## 2017-11-10 LAB — FOLLICLE STIMULATING HORMONE: FSH: 26.9 m[IU]/mL

## 2017-11-12 ENCOUNTER — Other Ambulatory Visit: Payer: Self-pay | Admitting: Obstetrics & Gynecology

## 2017-11-12 ENCOUNTER — Telehealth: Payer: Self-pay | Admitting: Obstetrics & Gynecology

## 2017-11-12 MED ORDER — GABAPENTIN 100 MG PO CAPS: ORAL_CAPSULE | ORAL | 0 refills | Status: DC

## 2017-11-12 NOTE — Telephone Encounter (Signed)
Advised patient Dr.Miller hasn't reviewed labs yet but she hopefully will do soon and we can call her back with results. Patient states forgot MyChart password and would really like a call back. Routed to [place]

## 2017-11-12 NOTE — Telephone Encounter (Signed)
Called pt personally of results and will start her on Gabapentin.  Needs PUS but separate message sent the triage and order placed.  Ok to close encounter.

## 2017-11-12 NOTE — Telephone Encounter (Signed)
Patient called and said she got a message she has results ready on MyChart but she'd like someone to call with the results instead.

## 2017-11-12 NOTE — Addendum Note (Signed)
Addended by: Megan Salon on: 11/12/2017 05:15 PM   Modules accepted: Orders

## 2017-11-15 ENCOUNTER — Telehealth: Payer: Self-pay | Admitting: Obstetrics & Gynecology

## 2017-11-15 NOTE — Telephone Encounter (Signed)
-----   Message from Megan Salon, MD sent at 11/12/2017  5:14 PM EDT ----- Spoke with pt personally.  Hormonal levels indicate early menopause.  Pt does not want to be on HRT.  Alternative options discussed.  Decided to try Gabapentin 100mg  nightly x 7 nights titrating up to 300mg  as needed.   Pt still having a lot of irregular bleeding.  Needs PUS.  Please call to schedule.  Order placed.

## 2017-11-15 NOTE — Telephone Encounter (Signed)
Patient returned call to Suzy. °

## 2017-11-15 NOTE — Telephone Encounter (Signed)
See telephone encounter dated 11/15/17, will close encounter.

## 2017-11-15 NOTE — Telephone Encounter (Signed)
Notes recorded by Burnice Logan, RN on 11/15/2017 at 9:58 AM EDT Left message to call Sharee Pimple at [phone].

## 2017-11-15 NOTE — Telephone Encounter (Signed)
Returned call to [name] [name].Spoke with [name] [name] regarding benefit for a scheduled ultrasound. [name] [name] understood information presented. [name] [name] scheduled 11/25/17 with Dr Sabra Heck. [name] [name] aware of appointment date, arrival time and cancellation policy.  [name] [name] advises she will call back if further concerns about the appointment. Ok to close

## 2017-11-15 NOTE — Telephone Encounter (Signed)
Returning a call to [place],

## 2017-11-15 NOTE — Telephone Encounter (Signed)
Call placed to patient to review benefits and schedule recommended ultrasound. Left voicemail message requesting a return call °

## 2017-11-15 NOTE — Telephone Encounter (Signed)
Spoke with patient. PUS scheduled for 4/18 at 4pm, consult to follow at 4:30pm with Dr. Sabra Heck. Patient declined earlier time offered on this day. Patient at work, states she will return call to [place] to review benefits at later date. Patient verbalizes understanding.   Routing to [place] D.  Cc: Dr. Sabra Heck

## 2017-11-25 ENCOUNTER — Ambulatory Visit (INDEPENDENT_AMBULATORY_CARE_PROVIDER_SITE_OTHER): Payer: BLUE CROSS/BLUE SHIELD

## 2017-11-25 ENCOUNTER — Ambulatory Visit (INDEPENDENT_AMBULATORY_CARE_PROVIDER_SITE_OTHER): Payer: BLUE CROSS/BLUE SHIELD | Admitting: Obstetrics & Gynecology

## 2017-11-25 ENCOUNTER — Encounter: Payer: Self-pay | Admitting: Obstetrics & Gynecology

## 2017-11-25 VITALS — BP 120/70 | HR 84 | Resp 14 | Ht 66.75 in | Wt 158.0 lb

## 2017-11-25 DIAGNOSIS — R232 Flushing: Secondary | ICD-10-CM

## 2017-11-25 DIAGNOSIS — N926 Irregular menstruation, unspecified: Secondary | ICD-10-CM

## 2017-11-25 DIAGNOSIS — R6882 Decreased libido: Secondary | ICD-10-CM | POA: Diagnosis not present

## 2017-11-25 MED ORDER — NONFORMULARY OR COMPOUNDED ITEM: 0 refills | Status: DC

## 2017-11-25 NOTE — Progress Notes (Signed)
GYNECOLOGY  VISIT  CC:   Here for ultrasound and to review results  HPI: 47 y.o. G32P0000 Married Caucasian female here for ultrasound due to recent elevated FSH and low estradiol with h/o irregular bleeding.  Pt was having hot flashes but started neurontin for hot flashes and this has really helped.  Is up to the 200mg  nightly.  Has not had any side effects.  Continues to have issues with libido.  Would like to try something for this.  Ultrasound results:   Uterus: 8.2 x 5.9 x 5.0 Endometrium:  23mm Left ovary:  2.8 x 1.6 x 1.4cm Right ovary:  3.5 x 1.7 x 1.7cm with 1.7 x 1.2  GYNECOLOGIC HISTORY: Patient's last menstrual period was 11/08/2017. Contraception: none Menopausal hormone therapy: none  Patient Active Problem List   Diagnosis Date Noted  . INFECTIOUS COLITIS ENTERITIS AND GASTROENTERITIS 05/14/2009  . CONSTIPATION 05/14/2009  . IRRITABLE BOWEL SYNDROME 05/14/2009  . INTERNAL THROMBOSED HEMORRHOIDS 12/31/2007    Past Medical History:  Diagnosis Date  . Abnormal Pap smear, atypical squamous cells of undetermined sign (ASC-US) 10/98   CIN 1  . Condyloma acuminata 2001  . Endometriosis 8/95   DepoLupron 4/96-10/96  . Hiatal hernia   . History of campylobacteriosis   . HSV-2 infection   . IBS (irritable bowel syndrome)   . Internal thrombosed hemorrhoids   . Migraine    without aura    Past Surgical History:  Procedure Laterality Date  . BREAST ENHANCEMENT SURGERY  02/2000  . BREAST MASS EXCISION Right 12/01   fibroadenoma  . EXPLORATORY LAPAROTOMY  8/95   endometriosis    MEDS:   Current Outpatient Medications on File Prior to Visit  Medication Sig Dispense Refill  . gabapentin (NEURONTIN) 100 MG capsule Take 100mg  nightly x 7 nights, increase to 200mg  nightly x 7, and then increase to 300mg  nightly if needed 63 capsule 0  . pseudoephedrine (SUDAFED) 30 MG tablet Take 30 mg by mouth every 8 (eight) hours as needed for congestion.    . valACYclovir  (VALTREX) 1000 MG tablet TAKE 1 TABLET BY MOUTH DAILY AND 2 TABLETS PER DAY IS FLARE UP FOR 3 DAYS 30 tablet 13   No current facility-administered medications on file prior to visit.     ALLERGIES: Aspirin; Doxycycline; Latex; and Sulfonamide derivatives  Family History  Problem Relation Age of Onset  . Irritable bowel syndrome Mother   . Hernia Mother   . Ulcers Maternal Grandfather        gastrectomy  . Leukemia Maternal Grandfather   . Diabetes Maternal Grandfather   . Prostate cancer Maternal Grandfather   . Diabetes Maternal Uncle   . Diabetes Maternal Grandmother   . Coronary artery disease Maternal Grandmother   . Stroke Maternal Grandmother   . Heart failure Maternal Grandmother   . Colon cancer Neg Hx     SH:  Married, non smoker  Review of Systems  Genitourinary:       Decreased libido    PHYSICAL EXAMINATION:    BP 120/70 (BP Location: Right Arm, Patient Position: Sitting, Cuff Size: Large)   Pulse 84   Resp 14   Ht 5' 6.75" (1.695 m)   Wt 158 lb (71.7 kg)   LMP 11/08/2017   BMI 24.93 kg/m     General appearance: alert, cooperative and appears stated age No other exam was performed  Assessment: Perimenopausal female Stressors Decreased libido Hot flashes improved with gabpentin  Plan: Will try topical testosterone  propionate 2% in petrolatum, apply 1/4 tsp two to three times weekly.  Risks, side effects of hair growth and acne in particular reviewed.  If has improvement libido, will need serum testosterone level in 3 months prior to RF. Pt had moderately elevated FSH a little over two weeks ago and today has evidence of corpus luteal cyst.  Bleeding is likely to continue being irregular but pt does not desire anything for cycle regulation that would convey birth control as well.  Will continue to monitor.   ~20 minutes spent with patient >50% of time was in face to face discussion of above.     Addendum:  Inhomogeneous myometrium with cystic  changes suggestive of adenomyosis. [place]

## 2017-12-03 ENCOUNTER — Other Ambulatory Visit: Payer: Self-pay | Admitting: Obstetrics & Gynecology

## 2018-02-02 ENCOUNTER — Telehealth: Payer: Self-pay | Admitting: Obstetrics & Gynecology

## 2018-02-02 NOTE — Telephone Encounter (Signed)
Return call to patient. Left message to call back. 

## 2018-02-02 NOTE — Telephone Encounter (Signed)
Patient is calling asking for a prescription to be called to her pharmacy for a yeast infection. Patient declined coming to an appointment today. Patient states that she has never had to come in for a yeast infection, that a prescription has always been called to her pharmacy.

## 2018-02-02 NOTE — Telephone Encounter (Signed)
Patient called back. States she doesn't need office visit as she and "everybody know what yeast infection is."  Complains of white clumpy discharge with itching. Has just returned from beach trip wearing damp clothes. Has not tried over the counter meds.  Declined office visit due to unmet deductible.  Previous Affirm tests all negative. Advised recommend  office visit for evaluation to treat or may try over the counter medication and call back if not improved and desires office visit.   Routing to provider for final review. Will close encounter.

## 2018-02-14 ENCOUNTER — Encounter: Payer: Self-pay | Admitting: Obstetrics & Gynecology

## 2018-02-14 ENCOUNTER — Other Ambulatory Visit: Payer: Self-pay

## 2018-02-14 ENCOUNTER — Ambulatory Visit (INDEPENDENT_AMBULATORY_CARE_PROVIDER_SITE_OTHER): Payer: BLUE CROSS/BLUE SHIELD | Admitting: Obstetrics & Gynecology

## 2018-02-14 VITALS — BP 118/70 | HR 96 | Resp 16 | Ht 66.75 in | Wt 167.2 lb

## 2018-02-14 DIAGNOSIS — R635 Abnormal weight gain: Secondary | ICD-10-CM | POA: Diagnosis not present

## 2018-02-14 DIAGNOSIS — N898 Other specified noninflammatory disorders of vagina: Secondary | ICD-10-CM

## 2018-02-14 LAB — POCT URINE PREGNANCY: Preg Test, Ur: NEGATIVE

## 2018-02-14 MED ORDER — FLUCONAZOLE 150 MG PO TABS: 150.0000 mg | ORAL_TABLET | Freq: Once | ORAL | 0 refills | Status: AC

## 2018-02-14 MED ORDER — PHENTERMINE HCL 37.5 MG PO CAPS: 37.5000 mg | ORAL_CAPSULE | ORAL | 0 refills | Status: DC

## 2018-02-14 MED ORDER — GABAPENTIN 100 MG PO CAPS: ORAL_CAPSULE | ORAL | 0 refills | Status: DC

## 2018-02-14 NOTE — Progress Notes (Signed)
GYNECOLOGY  VISIT  CC:   Vaginal itching, discharge  HPI: 47 y.o. G68P0000 Married Caucasian female here for vaginal itching and discharge x 3 weeks.  She has not tried anything OTC for treatment.  Denies odor.  Denies pelvic pain.  She did have a cycle very light cycle in June.    Gabapentin did help hot flashes however she is out the prescription.  She has not asked for a refill until today.  This helped with sleep and hot flashes.  Would like to continue.    Also very frustrated with weight.  Would like to consider weight loss medication.  Has used phentermine in the past.  Side effects/risks reviewed.  Questions answered.  GYNECOLOGIC HISTORY: Patient's last menstrual period was 01/22/2018 (approximate). Contraception: none Menopausal hormone therapy: none  Patient Active Problem List   Diagnosis Date Noted  . INFECTIOUS COLITIS ENTERITIS AND GASTROENTERITIS 05/14/2009  . CONSTIPATION 05/14/2009  . IRRITABLE BOWEL SYNDROME 05/14/2009  . INTERNAL THROMBOSED HEMORRHOIDS 12/31/2007    Past Medical History:  Diagnosis Date  . Abnormal Pap smear, atypical squamous cells of undetermined sign (ASC-US) 10/98   CIN 1  . Condyloma acuminata 2001  . Endometriosis 8/95   DepoLupron 4/96-10/96  . Hiatal hernia   . History of campylobacteriosis   . HSV-2 infection   . IBS (irritable bowel syndrome)   . Internal thrombosed hemorrhoids   . Migraine    without aura    Past Surgical History:  Procedure Laterality Date  . BREAST ENHANCEMENT SURGERY  02/2000  . BREAST MASS EXCISION Right 12/01   fibroadenoma  . EXPLORATORY LAPAROTOMY  8/95   endometriosis    MEDS:   Current Outpatient Medications on File Prior to Visit  Medication Sig Dispense Refill  . gabapentin (NEURONTIN) 100 MG capsule Take 100mg  nightly x 7 nights, increase to 200mg  nightly x 7, and then increase to 300mg  nightly if needed 63 capsule 0  . pseudoephedrine (SUDAFED) 30 MG tablet Take 30 mg by mouth every 8  (eight) hours as needed for congestion.    . valACYclovir (VALTREX) 1000 MG tablet TAKE 1 TABLET BY MOUTH DAILY AND 2 TABLETS PER DAY IS FLARE UP FOR 3 DAYS 30 tablet 13   No current facility-administered medications on file prior to visit.     ALLERGIES: Aspirin; Doxycycline; Latex; and Sulfonamide derivatives  Family History  Problem Relation Age of Onset  . Irritable bowel syndrome Mother   . Hernia Mother   . Ulcers Maternal Grandfather        gastrectomy  . Leukemia Maternal Grandfather   . Diabetes Maternal Grandfather   . Prostate cancer Maternal Grandfather   . Diabetes Maternal Uncle   . Diabetes Maternal Grandmother   . Coronary artery disease Maternal Grandmother   . Stroke Maternal Grandmother   . Heart failure Maternal Grandmother   . Colon cancer Neg Hx     SH:  Married, non smoker  Review of Systems  Genitourinary:       Abnormal discharge Vaginal itching  Loss of sexual interest   All other systems reviewed and are negative.   PHYSICAL EXAMINATION:    BP 118/70 (BP Location: Right Arm, Patient Position: Sitting, Cuff Size: Normal)   Pulse 96   Resp 16   Ht 5' 6.75" (1.695 m)   Wt 167 lb 3.2 oz (75.8 kg)   LMP 01/22/2018 (Approximate)   BMI 26.38 kg/m     General appearance: alert, cooperative and appears stated age  Lymph:  No inguinal LAD noted  Pelvic: External genitalia:  no lesions              Urethra:  normal appearing urethra with no masses, tenderness or lesions              Bartholins and Skenes: normal                 Vagina: normal appearing vagina with normal color, no lesions, watery discharge noted              Cervix: no lesions              Bimanual Exam:  Uterus:  normal size, contour, position, consistency, mobility, non-tender              Adnexa: no mass, fullness, tenderness  Chaperone was present for exam.  Assessment: Vaginal discharge  Weight gain Hot flashes  Plan: Affirm obtained Diflucan 150mg  po x 1, repeat 72  hours and OTC topical clotrimazole recommended Phentermine 37.5mg  daily.  #30/1RF.  Recheck 1-2 months.  Aware to call with any side effects/concerns. Restart Gabapentin 100mg  nightly x 7 nights, then 200mg  nightly x 14 nights, then 300mg  nightly.

## 2018-02-15 LAB — VAGINITIS/VAGINOSIS, DNA PROBE
Candida Species: NEGATIVE
Gardnerella vaginalis: NEGATIVE
Trichomonas vaginosis: NEGATIVE

## 2018-02-18 ENCOUNTER — Other Ambulatory Visit: Payer: Self-pay | Admitting: *Deleted

## 2018-02-18 MED ORDER — ESTRADIOL 10 MCG VA TABS: 1.0000 | ORAL_TABLET | Freq: Every evening | VAGINAL | 1 refills | Status: DC

## 2018-03-11 ENCOUNTER — Other Ambulatory Visit: Payer: Self-pay | Admitting: Obstetrics & Gynecology

## 2018-03-11 NOTE — Telephone Encounter (Signed)
Medication refill request: Rx Neurotin 100 mg Last AEX: 05/03/17 Next AEX: 07/15/18 Last MMG (if hormonal medication request): 04/14/18 Bi-RADS 1: Negative Refill authorized: Last refill 02/14/18, #63, Refill-0. Please refill appropriate

## 2018-03-12 NOTE — Telephone Encounter (Signed)
Please call pt for update.  I'd like to know dosing she is using so I can modify rx if needed.  Thanks.

## 2018-03-14 NOTE — Telephone Encounter (Signed)
Patient states she still taking 1 capsule nightly but she is planning to take 2 capsules soon. She states she won't take more than that for now until she comes to see you.

## 2018-03-15 NOTE — Telephone Encounter (Signed)
RF completed for the 100mg  dosing.  Thanks.

## 2018-04-05 ENCOUNTER — Other Ambulatory Visit: Payer: Self-pay | Admitting: Obstetrics & Gynecology

## 2018-04-05 NOTE — Telephone Encounter (Signed)
Medication refill request: Phentermine 37.5  Last AEX: 05/03/17 Next AEX: 04/14/18 Last MMG (if hormonal medication request): Bi-rads Category 1 neg  Refill authorized: Spoke with patient she states that she is out of her RX and requested a refill.

## 2018-04-07 ENCOUNTER — Ambulatory Visit: Payer: BLUE CROSS/BLUE SHIELD | Admitting: Obstetrics & Gynecology

## 2018-04-14 ENCOUNTER — Encounter: Payer: Self-pay | Admitting: Obstetrics & Gynecology

## 2018-04-14 ENCOUNTER — Other Ambulatory Visit: Payer: Self-pay

## 2018-04-14 ENCOUNTER — Ambulatory Visit (INDEPENDENT_AMBULATORY_CARE_PROVIDER_SITE_OTHER): Payer: BLUE CROSS/BLUE SHIELD | Admitting: Obstetrics & Gynecology

## 2018-04-14 VITALS — BP 126/70 | HR 102 | Resp 16 | Ht 66.75 in | Wt 168.0 lb

## 2018-04-14 DIAGNOSIS — R635 Abnormal weight gain: Secondary | ICD-10-CM

## 2018-04-14 DIAGNOSIS — R232 Flushing: Secondary | ICD-10-CM

## 2018-04-14 MED ORDER — PHENTERMINE HCL 15 MG PO CAPS: 15.0000 mg | ORAL_CAPSULE | ORAL | 1 refills | Status: DC

## 2018-04-14 NOTE — Progress Notes (Signed)
eGYNECOLOGY  VISIT  CC:   Follow-up on phentermine  HPI: 47 y.o. G20P0000 Married Caucasian female here for medication management.  Taking medication but has not lost any weight.   Has done a lot of traveling this summer.  Had anniversary trip to [place], went to [employer], had a wedding that was at [place].    Having the sensation of being "wired" in the evening.  Using wine some for side effects.  Has had some trouble sleeping with this medication.  D/w pt dosage is causing side effects and need to lower this.  If continues even on lower dosage, should stop it.  As well, diet and exercise program is important.    Also, using gabapentin 100mg  nightly for hot flashes and sleep.  RF request came from pharmacy this week so reviewed with pt how she is taking this.  Feels this has helped.    GYNECOLOGIC HISTORY: Patient's last menstrual period was 04/06/2018 (approximate). Contraception: none Menopausal hormone therapy: none  Patient Active Problem List   Diagnosis Date Noted  . INFECTIOUS COLITIS ENTERITIS AND GASTROENTERITIS 05/14/2009  . CONSTIPATION 05/14/2009  . IRRITABLE BOWEL SYNDROME 05/14/2009  . INTERNAL THROMBOSED HEMORRHOIDS 12/31/2007    Past Medical History:  Diagnosis Date  . Abnormal Pap smear, atypical squamous cells of undetermined sign (ASC-US) 10/98   CIN 1  . Condyloma acuminata 2001  . Endometriosis 8/95   DepoLupron 4/96-10/96  . Hiatal hernia   . History of campylobacteriosis   . HSV-2 infection   . IBS (irritable bowel syndrome)   . Internal thrombosed hemorrhoids   . Migraine    without aura    Past Surgical History:  Procedure Laterality Date  . BREAST ENHANCEMENT SURGERY  02/2000  . BREAST MASS EXCISION Right 12/01   fibroadenoma  . EXPLORATORY LAPAROTOMY  8/95   endometriosis    MEDS:   Current Outpatient Medications on File Prior to Visit  Medication Sig Dispense Refill  . gabapentin (NEURONTIN) 100 MG capsule TAKE 1-2 CAPSULES BY  MOUTH NIGHTLY 60 capsule 1  . phentermine 37.5 MG capsule TAKE 1 CAPSULE (37.5 MG TOTAL) BY MOUTH EVERY MORNING. 30 capsule 0  . pseudoephedrine (SUDAFED) 30 MG tablet Take 30 mg by mouth every 8 (eight) hours as needed for congestion.    . valACYclovir (VALTREX) 1000 MG tablet TAKE 1 TABLET BY MOUTH DAILY AND 2 TABLETS PER DAY IS FLARE UP FOR 3 DAYS 30 tablet 13   No current facility-administered medications on file prior to visit.     ALLERGIES: Aspirin; Doxycycline; Latex; and Sulfonamide derivatives  Family History  Problem Relation Age of Onset  . Irritable bowel syndrome Mother   . Hernia Mother   . Ulcers Maternal Grandfather        gastrectomy  . Leukemia Maternal Grandfather   . Diabetes Maternal Grandfather   . Prostate cancer Maternal Grandfather   . Diabetes Maternal Uncle   . Diabetes Maternal Grandmother   . Coronary artery disease Maternal Grandmother   . Stroke Maternal Grandmother   . Heart failure Maternal Grandmother   . Colon cancer Neg Hx     SH:  Married, non smoker  Review of Systems  Skin: Positive for itching.  All other systems reviewed and are negative.   PHYSICAL EXAMINATION:    BP 126/70 (BP Location: Right Arm, Patient Position: Sitting, Cuff Size: Normal)   Pulse (!) 102   Resp 16   Ht 5' 6.75" (1.695 m)   Wt  168 lb (76.2 kg)   LMP 04/06/2018 (Approximate)   BMI 26.51 kg/m     General appearance: alert, cooperative and appears stated age CV:  Regular rate and rhythm Lungs:  clear to auscultation, no wheezes, rales or rhonchi, symmetric air entry  Assessment: Desired weight loss Perimenopausal with hot flashes  Plan: Using the gabapentin 100mg  nightly right now.  Does not need RFs. Change phentermine to 15mg  in am.  If continues to have side effects, will need to stop medication   ~15 minutes spent with patient >50% of time was in face to face discussion of above.

## 2018-05-26 ENCOUNTER — Other Ambulatory Visit: Payer: Self-pay | Admitting: Obstetrics & Gynecology

## 2018-05-26 NOTE — Telephone Encounter (Signed)
Medication refill request: Valtrex Last AEX:  05/07/2017 Next AEX: 07/15/2018 Last MMG (if hormonal medication request): 04/14/2016 BI-RADS CATEGORY  1:  Negative Refill authorized: #30, 5 refills

## 2018-05-27 ENCOUNTER — Encounter: Payer: Self-pay | Admitting: Obstetrics & Gynecology

## 2018-05-27 ENCOUNTER — Telehealth: Payer: Self-pay | Admitting: Obstetrics & Gynecology

## 2018-05-27 ENCOUNTER — Ambulatory Visit (INDEPENDENT_AMBULATORY_CARE_PROVIDER_SITE_OTHER): Payer: BLUE CROSS/BLUE SHIELD | Admitting: Obstetrics & Gynecology

## 2018-05-27 VITALS — BP 128/80 | HR 88 | Ht 66.75 in

## 2018-05-27 DIAGNOSIS — N926 Irregular menstruation, unspecified: Secondary | ICD-10-CM | POA: Diagnosis not present

## 2018-05-27 LAB — HEMOGLOBIN: Hemoglobin: 13.4

## 2018-05-27 MED ORDER — NORETHINDRONE 0.35 MG PO TABS: 1.0000 | ORAL_TABLET | Freq: Every day | ORAL | 2 refills | Status: DC

## 2018-05-27 NOTE — Telephone Encounter (Signed)
1204: Return call to patient. She is offered appointment today with Dr. Sabra Heck arrival at 1545. She is agreeable to appointment as scheduled.  Encounter closed.

## 2018-05-27 NOTE — Progress Notes (Signed)
GYNECOLOGY  VISIT  CC:   Irregular bleeding  HPI: 47 y.o. G0P0000 Married White or Caucasian female here for vaginal bleeding that started Wednesday.  Had some increased flow yesterday and this morning.  Used tampons primarily and this morning had to change every 1 to 1 1/2 hours.  Did have significant cramping that radiated around to her back.    Last FSH 26.9.  Not taking any hormonal therapy.  Having cycles about every 14 days.  Flow lasts 3-4 days.  PUS performed 11/25/17.  Uterus was 8 x 6 x 5cm with 11mm endometrium.  Ovaries appear normal except for corpus luteal cyst on left ovary.  Frustrated with frequency of bleeding and with decreased libido.  We have discussed this during the past year (as well as frustrations with weight).  Has lots of work stressors.  Tried topical testosterone which did not help.    GYNECOLOGIC HISTORY: Patient's last menstrual period was 05/25/2018 (exact date). Contraception: none Menopausal hormone therapy: none  Patient Active Problem List   Diagnosis Date Noted  . INFECTIOUS COLITIS ENTERITIS AND GASTROENTERITIS 05/14/2009  . CONSTIPATION 05/14/2009  . IRRITABLE BOWEL SYNDROME 05/14/2009  . INTERNAL THROMBOSED HEMORRHOIDS 12/31/2007    Past Medical History:  Diagnosis Date  . Abnormal Pap smear, atypical squamous cells of undetermined sign (ASC-US) 10/98   CIN 1  . Condyloma acuminata 2001  . Endometriosis 8/95   DepoLupron 4/96-10/96  . Hiatal hernia   . History of campylobacteriosis   . HSV-2 infection   . IBS (irritable bowel syndrome)   . Internal thrombosed hemorrhoids   . Migraine    without aura    Past Surgical History:  Procedure Laterality Date  . BREAST ENHANCEMENT SURGERY  02/2000  . BREAST MASS EXCISION Right 12/01   fibroadenoma  . EXPLORATORY LAPAROTOMY  8/95   endometriosis    MEDS:   Current Outpatient Medications on File Prior to Visit  Medication Sig Dispense Refill  . gabapentin (NEURONTIN) 100 MG capsule TAKE  1-2 CAPSULES BY MOUTH NIGHTLY 60 capsule 1  . phentermine 15 MG capsule Take 1 capsule (15 mg total) by mouth every morning. 30 capsule 1  . pseudoephedrine (SUDAFED) 30 MG tablet Take 30 mg by mouth every 8 (eight) hours as needed for congestion.    . valACYclovir (VALTREX) 1000 MG tablet TAKE 1 TABLET BY MOUTH DAILY AND 2 TABLETS PER DAY FOR FLARE UPS FOR 3 DAYS 30 tablet 2   No current facility-administered medications on file prior to visit.     ALLERGIES: Aspirin; Doxycycline; Latex; and Sulfonamide derivatives  Family History  Problem Relation Age of Onset  . Irritable bowel syndrome Mother   . Hernia Mother   . Ulcers Maternal Grandfather        gastrectomy  . Leukemia Maternal Grandfather   . Diabetes Maternal Grandfather   . Prostate cancer Maternal Grandfather   . Diabetes Maternal Uncle   . Diabetes Maternal Grandmother   . Coronary artery disease Maternal Grandmother   . Stroke Maternal Grandmother   . Heart failure Maternal Grandmother   . Colon cancer Neg Hx     SH:  Married, non smoker  Review of Systems  All other systems reviewed and are negative.   PHYSICAL EXAMINATION:    BP 128/80 (BP Location: Right Arm, Patient Position: Sitting, Cuff Size: Normal)   Pulse 88   Ht 5' 6.75" (1.695 m)   LMP 05/25/2018 (Exact Date)   BMI 26.51 kg/m  General appearance: alert, cooperative and appears stated age  Assessment: Irregular perimenopausal bleeding Decreased libido, decreased interest/arousal  Plan: Micronor daily.  Rx to pharmacy. If irregular bleeding continues, will proceed with endometrial biopsy.  Has AEX 07/15/18 so this is good interval of time to see if micronor will help.  Vyleesi discussed with pt.  Would consider.  Will see if prior authorization is needed.     ~15 minutes spent with patient >50% of time was in face to face discussion of above.

## 2018-05-27 NOTE — Telephone Encounter (Signed)
Spoke with patient. She states for two days she has been soaking a super tampon and large pad. Passing "huge bright red clots" per patient. States she does not feel well. Asked about clot size and she stated they were smaller than dime sized.  She is at work and is about to get on a conference call. She asks me to call her back at noon.   Reviewed with Dr. Sabra Heck, office visit today. Will call pt back at noon and offer 4:00 appointment with Dr. Sabra Heck.

## 2018-05-27 NOTE — Telephone Encounter (Signed)
Patient is bleeding heavily with period and pain.

## 2018-06-03 ENCOUNTER — Telehealth: Payer: Self-pay | Admitting: Obstetrics & Gynecology

## 2018-06-03 NOTE — Telephone Encounter (Signed)
Patient calling regarding prescription for [facility]. Would like to speak with a nurse.

## 2018-06-03 NOTE — Telephone Encounter (Signed)
Spoke with patient. Requesting RX for [facility] to [facility]. Patient is unsure if PA is required. Advised patient if PA required, pharmacy will notify office, we can submit PA electronically, can take up to 5 days for response. Patient agreeable.  Dr. Sabra Heck -please advise on [facility] Rx.

## 2018-06-08 ENCOUNTER — Telehealth: Payer: Self-pay | Admitting: *Deleted

## 2018-06-08 MED ORDER — BREMELANOTIDE ACETATE 1.75 MG/0.3ML ~~LOC~~ SOAJ: 1.0000 "pen " | SUBCUTANEOUS | 2 refills | Status: DC

## 2018-06-08 NOTE — Telephone Encounter (Signed)
Please let her know rx has been done.  This is an injection so will come in a pen like an EPI pen.  Use about 45 mintues before intercourse.  Can increase heart rate and lower blood pressure so just be aware of this.  Side effects of causing changing in skin pigmentation is possible.  This is more common with more requent use and with darker skin.  She should monitor for this.  I sent in rx for #6/2RF but I don't know what her insurance may cover.  Will just have to see.  Thanks.

## 2018-06-08 NOTE — Telephone Encounter (Signed)
Spoke with patient, advised as seen below per Dr. Sabra Heck. Patient to f/u with pharmacy for coverage, is aware to return call to office if any additional questions.

## 2018-06-08 NOTE — Telephone Encounter (Signed)
PA submitted for Vyleesi via [email or web address]  Key: [id number]  Patient notified. Advised can take up to 5 days for response, our office will notify of response once received.

## 2018-06-10 NOTE — Telephone Encounter (Signed)
Spoke with Colletta Maryland from [place], will be faxing form to request medical record/OV to confirm diagnosis for [facility].

## 2018-06-13 NOTE — Telephone Encounter (Signed)
No forms received from [place] to date, call, placed to f/u. Spoke with [place]. Was advised medical record need to complete PA. Fax medical record to [phone] ATTN: Key: [id number]   Copy of OV dated 05/27/18 faxed to [facility]

## 2018-06-14 NOTE — Telephone Encounter (Signed)
PA response received via [email or web address] for Vyleesi.  PA approved Effective from 06/08/2018 through 09/05/2018.  Patient notified of approval. Advised to f/u with pharmacy. Patient verbalizes understanding and is agreeable.   Encounter closed.

## 2018-06-15 ENCOUNTER — Telehealth: Payer: Self-pay | Admitting: Obstetrics & Gynecology

## 2018-06-15 DIAGNOSIS — N926 Irregular menstruation, unspecified: Secondary | ICD-10-CM

## 2018-06-15 NOTE — Telephone Encounter (Signed)
Spoke with patient. Patient started POP approximately 2 wks ago. Patient reports gaining 6lbs, increased acne and daily spotting since starting POP. No missed or late pills, taking roughly same time. Patient is aware can take 3 mo for menses to adjust to POP, may or may not have menses. Patient states she plans to stop medication, does not want to wait 3 months. Has not started [facility] yet.   Advised I will update Dr. Sabra Heck and return call if any recommendations provided. Patient agreeable.   Routing to Dr. Sabra Heck to review.

## 2018-06-15 NOTE — Telephone Encounter (Signed)
Patient is calling to speak with Dr.Miller's nurse, no details given.

## 2018-06-15 NOTE — Telephone Encounter (Signed)
Patient returning Jill's call.  °

## 2018-06-15 NOTE — Telephone Encounter (Signed)
Left message to call Garek Schuneman, RN at [facility] [phone].   

## 2018-06-17 ENCOUNTER — Encounter: Payer: Self-pay | Admitting: Obstetrics & Gynecology

## 2018-06-17 NOTE — Telephone Encounter (Signed)
Spoke with patient. She has elected to stop progesterone only pills due to side effects. She declines combination OCP's as well.   Agreeable to Endometrial biopsy and instructions given.  Moved annual exam appointment up.  Scheduled Endometrial biopsy and annual exam for 07/04/2018.  Instructions given.  Pt will call back with any questions or concerns prior to appointment.  Encounter to Dr. Sabra Heck for update and will close.

## 2018-06-17 NOTE — Telephone Encounter (Signed)
It's fine that she stops the POPs.  Combination OCPs is an option.  I would like to do an endometrial biopsy on her.  Please see if she will schedule.  Irregular bleeding is from being peri-menopausal and lack of regular ovulation.

## 2018-07-04 ENCOUNTER — Ambulatory Visit: Payer: Self-pay | Admitting: Obstetrics & Gynecology

## 2018-07-04 ENCOUNTER — Telehealth: Payer: Self-pay | Admitting: Obstetrics & Gynecology

## 2018-07-04 NOTE — Telephone Encounter (Signed)
Patient called and cancelled her AEX/EMB okay per SM for today due to complaints of being up all night sick last night and woke up with a fever this morning. She rescheduled to 07/15/18 for her AEX and wants to be sure it's okay to have the EMB done that day as well. Routing to triage to help coordinate EMB.  Cc: Dr. Sabra Heck

## 2018-07-04 NOTE — Telephone Encounter (Signed)
Yes, it should be fine to have the endometrial biopsy done that day as well.  Thanks.

## 2018-07-06 NOTE — Telephone Encounter (Signed)
Called and confirmed appointment details with patient. Patient has more questions. Routing to triage nurse.

## 2018-07-06 NOTE — Telephone Encounter (Signed)
Spoke with patient, patient has additional questions/concerns about EMB. Patient states she has spoken with family/friends and was advised she will need something stronger than motrin for pain or a "local anesthetic" for EMB.   Reviewed EMB with patient. Advised patient may experience some mild cramping and/or spotting. Recommended motrin 800mg  1hr prior as previously discussed. Patient states she will keep AEX as scheduled, will further discuss EMB day of visit with Dr. Sabra Heck. Advised I will review with Dr. Sabra Heck and return call with any additional recommendations. Patient agreeable.   Routing to provider for final review. Patient is agreeable to disposition. Will close encounter.

## 2018-07-14 ENCOUNTER — Other Ambulatory Visit: Payer: Self-pay | Admitting: Obstetrics & Gynecology

## 2018-07-15 ENCOUNTER — Encounter: Payer: Self-pay | Admitting: Obstetrics & Gynecology

## 2018-07-15 ENCOUNTER — Ambulatory Visit (INDEPENDENT_AMBULATORY_CARE_PROVIDER_SITE_OTHER): Payer: BLUE CROSS/BLUE SHIELD | Admitting: Obstetrics & Gynecology

## 2018-07-15 VITALS — BP 130/80 | HR 80 | Resp 16 | Ht 66.75 in | Wt 168.0 lb

## 2018-07-15 DIAGNOSIS — Z01419 Encounter for gynecological examination (general) (routine) without abnormal findings: Secondary | ICD-10-CM | POA: Diagnosis not present

## 2018-07-15 DIAGNOSIS — Z124 Encounter for screening for malignant neoplasm of cervix: Secondary | ICD-10-CM

## 2018-07-15 DIAGNOSIS — N926 Irregular menstruation, unspecified: Secondary | ICD-10-CM | POA: Diagnosis not present

## 2018-07-15 DIAGNOSIS — Z0000 Encounter for general adult medical examination without abnormal findings: Secondary | ICD-10-CM

## 2018-07-15 NOTE — Progress Notes (Addendum)
47 y.o. G0P0000 Married White or Caucasian female here for annual exam.  Reports she is having bleeding every other week.  Flow is heavy at times.  Does pass clots.  Does have cramping that's worse before bleeding begins.  Has been on micronor without improvement in bleeding.  Does not really want to estrogen products if possible.   Patient's last menstrual period was 07/15/2018.          Sexually active: No.  The current method of family planning is none.    Exercising: Yes.    run, weights Smoker:  no  Health Maintenance: Pap:  05/03/17 Neg   04/03/16 Neg. HR HPV:neg  History of abnormal Pap:  yes MMG:  04/14/16 BIRADS1:neg  Colonoscopy:  05/30/09 Normal. F/u 10 years  BMD:   Heel test TDaP:  2018 Pneumonia vaccine(s):  n/a Shingrix:   n/a Hep C testing: n/a Screening Labs: here today    reports that she has never smoked. She has never used smokeless tobacco. She reports that she does not drink alcohol or use drugs.  Past Medical History:  Diagnosis Date  . Abnormal Pap smear, atypical squamous cells of undetermined sign (ASC-US) 10/98   CIN 1  . Condyloma acuminata 2001  . Endometriosis 8/95   DepoLupron 4/96-10/96  . Hiatal hernia   . History of campylobacteriosis   . HSV-2 infection   . IBS (irritable bowel syndrome)   . Internal thrombosed hemorrhoids   . Migraine    without aura    Past Surgical History:  Procedure Laterality Date  . BREAST ENHANCEMENT SURGERY  02/2000  . BREAST MASS EXCISION Right 12/01   fibroadenoma  . EXPLORATORY LAPAROTOMY  8/95   endometriosis    Current Outpatient Medications  Medication Sig Dispense Refill  . gabapentin (NEURONTIN) 100 MG capsule TAKE 1-2 CAPSULES BY MOUTH NIGHTLY 60 capsule 1  . pseudoephedrine (SUDAFED) 30 MG tablet Take 30 mg by mouth every 8 (eight) hours as needed for congestion.    . valACYclovir (VALTREX) 1000 MG tablet TAKE 1 TABLET BY MOUTH DAILY AND 2 TABLETS PER DAY FOR FLARE UPS FOR 3 DAYS 30 tablet 2   No  current facility-administered medications for this visit.     Family History  Problem Relation Age of Onset  . Irritable bowel syndrome Mother   . Hernia Mother   . Ulcers Maternal Grandfather        gastrectomy  . Leukemia Maternal Grandfather   . Diabetes Maternal Grandfather   . Prostate cancer Maternal Grandfather   . Diabetes Maternal Uncle   . Diabetes Maternal Grandmother   . Coronary artery disease Maternal Grandmother   . Stroke Maternal Grandmother   . Heart failure Maternal Grandmother   . Colon cancer Neg Hx     Review of Systems  Genitourinary: Positive for menstrual problem and vaginal bleeding.       Loss of sexual interest   All other systems reviewed and are negative.   Exam:   BP 130/80 (BP Location: Right Arm, Patient Position: Sitting, Cuff Size: Normal)   Pulse 80   Resp 16   Ht 5' 6.75" (1.695 m)   Wt 168 lb (76.2 kg)   LMP 07/15/2018   BMI 26.51 kg/m    Height: 5' 6.75" (169.5 cm)  Ht Readings from Last 3 Encounters:  07/15/18 5' 6.75" (1.695 m)  05/27/18 5' 6.75" (1.695 m)  04/14/18 5' 6.75" (1.695 m)    General appearance: alert, cooperative and appears  stated age Head: Normocephalic, without obvious abnormality, atraumatic Neck: no adenopathy, supple, symmetrical, trachea midline and thyroid normal to inspection and palpation Lungs: clear to auscultation bilaterally Breasts: normal appearance, no masses or tenderness Heart: regular rate and rhythm Abdomen: soft, non-tender; bowel sounds normal; no masses,  no organomegaly Extremities: extremities normal, atraumatic, no cyanosis or edema Skin: Skin color, texture, turgor normal. No rashes or lesions Lymph nodes: Cervical, supraclavicular, and axillary nodes normal. No abnormal inguinal nodes palpated Neurologic: Grossly normal   Pelvic: External genitalia:  no lesions              Urethra:  normal appearing urethra with no masses, tenderness or lesions              Bartholins and  Skenes: normal                 Vagina: normal appearing vagina with normal color and discharge, no lesions              Cervix: no lesions              Pap taken: Yes.   Bimanual Exam:  Uterus:  normal size, contour, position, consistency, mobility, non-tender              Adnexa: normal adnexa and no mass, fullness, tenderness               Rectovaginal: Confirms               Anus:  normal sphincter tone, no lesions  Endometrial biopsy recommended.  Discussed with patient.  Verbal and written consent obtained.   Procedure:  Speculum placed.  Cervix visualized and cleansed with betadine prep.  A single toothed tenaculum was applied to the anterior lip of the cervix.  Endometrial pipelle was advanced through the cervix into the endometrial cavity without difficulty.  Pipelle passed to 7cm.  Suction applied and pipelle removed with good tissue sample obtained.  Tenculum removed.  No bleeding noted.  Patient tolerated procedure well.  Chaperone was present for exam.  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  Pt aware this is due pap smear obtained per her request.  Neg HR HPV 2017 Endometrial biopsy pending D/W pt treatment with D&C, ablation, low dosed OCPs.  Information given and risks reviewed. RF for Valtrex done in October.  Not needed today. Lab work placed.  She will return fasting for this:  CMP, Lipids, TSH, Vit D, CMP, and FSH return annually or prn

## 2018-07-18 LAB — CYTOLOGY - PAP: Diagnosis: NEGATIVE

## 2018-07-21 ENCOUNTER — Telehealth: Payer: Self-pay | Admitting: *Deleted

## 2018-07-21 ENCOUNTER — Other Ambulatory Visit: Payer: Self-pay | Admitting: Obstetrics & Gynecology

## 2018-07-21 DIAGNOSIS — Z1231 Encounter for screening mammogram for malignant neoplasm of breast: Secondary | ICD-10-CM

## 2018-07-21 NOTE — Telephone Encounter (Signed)
Left voice mail to call back 

## 2018-07-21 NOTE — Telephone Encounter (Signed)
Patient returning call.

## 2018-07-21 NOTE — Telephone Encounter (Signed)
-----   Message from Megan Salon, MD sent at 07/20/2018  8:32 PM EST ----- Please let pt know her pap was negative (02 recall) and endometrial biopsy was negative for abnormal cells.  She is considering a low dosed OCP and if desires, Loloestrin can be sent to pharmacy--1 tab po q day.  She will need a 3 month follow up if decides to start this.  Thanks.

## 2018-07-22 MED ORDER — NORETHIN-ETH ESTRAD-FE BIPHAS 1 MG-10 MCG / 10 MCG PO TABS: 1.0000 | ORAL_TABLET | Freq: Every day | ORAL | 0 refills | Status: DC

## 2018-07-22 NOTE — Telephone Encounter (Signed)
Patient notified.   Patient would like to start on Lo Loestrin. Rx sent to pharmacy. 3 month f/u appt scheduled. Patient states she also would like to start a low dose of phentermine.   Dr. Sabra Heck please advise.

## 2018-07-25 NOTE — Telephone Encounter (Signed)
She was on the 37.5mg  dosage earlier this year with side effects.  Then she was prescribed the 15mg  dosage but she stopped it and I'm not sure why.  Can you find out for me.  If she was having any side effects, then it should not be restarted.  Thanks.

## 2018-07-26 ENCOUNTER — Other Ambulatory Visit: Payer: Self-pay | Admitting: Obstetrics & Gynecology

## 2018-07-26 MED ORDER — PHENTERMINE HCL 15 MG PO CAPS: 15.0000 mg | ORAL_CAPSULE | ORAL | 0 refills | Status: DC

## 2018-07-26 NOTE — Telephone Encounter (Signed)
Rx sent to pharmacy for #30/0RF.  Has to be seen monthly if taking this and for RFs.  Ok to schedule in one month.

## 2018-07-26 NOTE — Telephone Encounter (Signed)
Patient states she did not have any side effects with 15mg . She states Rx ran out and that's why she stopped taking it.   Please advise.

## 2018-07-27 NOTE — Telephone Encounter (Signed)
Phentermine f/u appt scheduled 08/26/18.  Patient states OCP were over $300. Asking if there is any other options   Please advise.

## 2018-08-01 NOTE — Telephone Encounter (Signed)
Please advise pt of coupon on loloestrin website.  This should make it $25/month.  Website is [email or web address]

## 2018-08-04 NOTE — Telephone Encounter (Signed)
Called patient and left a message notifying her of the savings card.

## 2018-08-11 ENCOUNTER — Other Ambulatory Visit (INDEPENDENT_AMBULATORY_CARE_PROVIDER_SITE_OTHER): Payer: BLUE CROSS/BLUE SHIELD

## 2018-08-11 DIAGNOSIS — Z0000 Encounter for general adult medical examination without abnormal findings: Secondary | ICD-10-CM

## 2018-08-11 DIAGNOSIS — N926 Irregular menstruation, unspecified: Secondary | ICD-10-CM

## 2018-08-16 LAB — LIPID PANEL
Chol/HDL Ratio: 3.4 ratio (ref 0.0–4.4)
Cholesterol, Total: 256 mg/dL — ABNORMAL HIGH (ref 100–199)
HDL: 75 mg/dL (ref >39)
LDL Calculated: 165 mg/dL — ABNORMAL HIGH (ref 0–99)
Triglycerides: 81 mg/dL (ref 0–149)
VLDL Cholesterol Cal: 16 mg/dL (ref 5–40)

## 2018-08-16 LAB — CBC
Hematocrit: 40 % (ref 34.0–46.6)
Hemoglobin: 13.6 g/dL (ref 11.1–15.9)
MCH: 33.9 pg — ABNORMAL HIGH (ref 26.6–33.0)
MCHC: 34 g/dL (ref 31.5–35.7)
MCV: 100 fL — ABNORMAL HIGH (ref 79–97)
Platelets: 347 10*3/uL (ref 150–450)
RBC: 4.01 x10E6/uL (ref 3.77–5.28)
RDW: 11.7 % — ABNORMAL LOW (ref 12.3–15.4)
WBC: 5.6 10*3/uL (ref 3.4–10.8)

## 2018-08-16 LAB — VITAMIN D 25 HYDROXY (VIT D DEFICIENCY, FRACTURES): Vit D, 25-Hydroxy: 26.6 ng/mL — ABNORMAL LOW (ref 30.0–100.0)

## 2018-08-16 LAB — COMPREHENSIVE METABOLIC PANEL
ALT: 20 IU/L (ref 0–32)
AST: 21 IU/L (ref 0–40)
Albumin/Globulin Ratio: 1.9 (ref 1.2–2.2)
Albumin: 4.4 g/dL (ref 3.5–5.5)
Alkaline Phosphatase: 49 IU/L (ref 39–117)
BUN/Creatinine Ratio: 15 (ref 9–23)
BUN: 12 mg/dL (ref 6–24)
Bilirubin Total: 0.6 mg/dL (ref 0.0–1.2)
CO2: 23 mmol/L (ref 20–29)
Calcium: 9.4 mg/dL (ref 8.7–10.2)
Chloride: 100 mmol/L (ref 96–106)
Creatinine, Ser: 0.79 mg/dL (ref 0.57–1.00)
GFR calc Af Amer: 102 mL/min/{1.73_m2} (ref >59)
GFR calc non Af Amer: 88 mL/min/{1.73_m2} (ref >59)
Globulin, Total: 2.3 g/dL (ref 1.5–4.5)
Glucose: 92 mg/dL (ref 65–99)
Potassium: 4.4 mmol/L (ref 3.5–5.2)
Sodium: 139 mmol/L (ref 134–144)
Total Protein: 6.7 g/dL (ref 6.0–8.5)

## 2018-08-16 LAB — FOLLICLE STIMULATING HORMONE: FSH: 32.8 m[IU]/mL

## 2018-08-16 LAB — TSH: TSH: 3.3 u[IU]/mL (ref 0.450–4.500)

## 2018-08-22 ENCOUNTER — Telehealth: Payer: Self-pay | Admitting: Obstetrics & Gynecology

## 2018-08-22 NOTE — Telephone Encounter (Signed)
Megan Salon, MD  [place], Harley Hallmark, RN        Please let pt knwo her [facility] was 3. This is in the perimenopausal range. She decided to start and OCP to see if this would help the irregular bleeding. She does need follow up in 3 months if not already scheduled. Vit D was 26. Goal is >30. She should take 1000IU Vit D OTC daily. Thyroid was normal. Cholesterol was elevated at 256 and LDLs were 165. If this was fasting, she does need to follow up with PCP to discuss options including if treatment is indicated. If this was not fasting, she does need to repeat it fasting and should do this with her follow up appt. Please put on the appt notes that this is needed. Thanks. CMP was normal and CBC was fine as well.     Left message to call [place] at [phone].

## 2018-08-22 NOTE — Telephone Encounter (Signed)
Patient is calling requesting a nurse to give her a call back to go over her results.

## 2018-08-22 NOTE — Telephone Encounter (Signed)
Routing to [place] for review and advise of results from 08/11/2018.

## 2018-08-22 NOTE — Telephone Encounter (Signed)
Results routed to you.  Thanks for message.

## 2018-08-26 ENCOUNTER — Ambulatory Visit: Payer: BLUE CROSS/BLUE SHIELD | Admitting: Obstetrics & Gynecology

## 2018-08-29 ENCOUNTER — Ambulatory Visit
Admission: RE | Admit: 2018-08-29 | Discharge: 2018-08-29 | Disposition: A | Discharge status: Home / Self Care | Payer: Self-pay | Source: Ambulatory Visit | Attending: Obstetrics & Gynecology | Admitting: Obstetrics & Gynecology

## 2018-08-29 DIAGNOSIS — Z1231 Encounter for screening mammogram for malignant neoplasm of breast: Secondary | ICD-10-CM

## 2018-09-05 ENCOUNTER — Other Ambulatory Visit: Payer: Self-pay | Admitting: Obstetrics & Gynecology

## 2018-09-05 NOTE — Telephone Encounter (Signed)
Medication refill request: Valtrex 1gm #30, 2R Last AEX:  07-15-18 Next AEX: 07-18-2019 Last MMG (if hormonal medication request): 08-29-18 Neg/density C/BiRads1 Refill authorized: Please advise

## 2018-09-05 NOTE — Telephone Encounter (Signed)
OK to close or is further follow up necessary? °

## 2018-09-06 ENCOUNTER — Telehealth: Payer: Self-pay | Admitting: Obstetrics & Gynecology

## 2018-09-06 ENCOUNTER — Ambulatory Visit: Payer: BLUE CROSS/BLUE SHIELD | Admitting: Obstetrics & Gynecology

## 2018-09-06 NOTE — Telephone Encounter (Signed)
Spoke with patient. Patient is unable to talk. States that she has an appointment with [place] today and plans to discuss results at that appointment. Encounter closed.

## 2018-09-06 NOTE — Telephone Encounter (Signed)
Patient is requesting to speak with a nurse regarding blood work results due to canceled appointment.

## 2018-09-07 NOTE — Telephone Encounter (Signed)
Message from Dr. Sabra Heck discussed.  Appointment rescheduled for Phentermine Refill.  Cholesterol was fasting. She does not have PCP, will research providers.  She has not started Lo Loestrin due to cost.  Office visit scheduled for 09/13/2018 with Dr. Sabra Heck to discuss OCP and Phentermine.

## 2018-09-07 NOTE — Telephone Encounter (Signed)
Message left to return call to [place] at [phone]. Can speak with any nurse at return call.    Labs collected at [place] AM.  No PCP indicated.  Has appointment for OCP follow up 10/28/2018.

## 2018-09-07 NOTE — Telephone Encounter (Signed)
Notes recorded by Megan Salon, MD on 08/22/2018 at 3:35 PM EST Please let pt knwo her [facility] was 67. This is in the perimenopausal range. She decided to start and OCP to see if this would help the irregular bleeding. She does need follow up in 3 months if not already scheduled. Vit D was 26. Goal is >30. She should take 1000IU Vit D OTC daily. Thyroid was normal. Cholesterol was elevated at 256 and LDLs were 165. If this was fasting, she does need to follow up with PCP to discuss options including if treatment is indicated. If this was not fasting, she does need to repeat it fasting and should do this with her follow up appt. Please put on the appt notes that this is needed. Thanks. CMP was normal and CBC was fine as well.   Thanks.

## 2018-09-13 ENCOUNTER — Encounter: Payer: Self-pay | Admitting: Obstetrics & Gynecology

## 2018-09-13 ENCOUNTER — Ambulatory Visit (INDEPENDENT_AMBULATORY_CARE_PROVIDER_SITE_OTHER): Payer: BLUE CROSS/BLUE SHIELD | Admitting: Obstetrics & Gynecology

## 2018-09-13 VITALS — BP 130/66 | HR 66 | Resp 14 | Ht 66.0 in | Wt 169.0 lb

## 2018-09-13 DIAGNOSIS — N926 Irregular menstruation, unspecified: Secondary | ICD-10-CM

## 2018-09-13 MED ORDER — PHENTERMINE HCL 15 MG PO CAPS: 15.0000 mg | ORAL_CAPSULE | ORAL | 1 refills | Status: DC

## 2018-09-13 NOTE — Progress Notes (Signed)
GYNECOLOGY  VISIT  CC:  Patient has had vaginal bleeding for the last 2 weeks  HPI: 48 y.o. G0P0000 Married White or Caucasian female here for follow up after changing OCPs.  She was on micronor but couldn't tolerate this.  She stopped this within a month (in November) due to side effects.  Since that time, she's had bleeding almost every other week.  Every cycle, there are heavy days.  Then the bleeding tapers off and gets dark in color but bleeding episodes last about 14 days.  She was prescribed Loloestrin but didn't start this due to cost.  She is willing to be on an OCP if can get cost under control.    Admits not taking phentermine regularly.  Continues to have work stressors.  D/w pt importance of regular compliance with medication vs risk of medication.  Aware is does not have >5% body weight loss in 12 weeks, needs to quit.  Ready to try and have improved compliance.  Understands needs follow up in two months if compliant.  Ultrasound was normal 4/19.  Endometrial biopsy was negative 12/19.  Pap was negative 07/15/18.  GYNECOLOGIC HISTORY: Patient's last menstrual period was 09/05/2018. Contraception: none Menopausal hormone therapy: none  Patient Active Problem List   Diagnosis Date Noted  . INFECTIOUS COLITIS ENTERITIS AND GASTROENTERITIS 05/14/2009  . CONSTIPATION 05/14/2009  . IRRITABLE BOWEL SYNDROME 05/14/2009  . INTERNAL THROMBOSED HEMORRHOIDS 12/31/2007    Past Medical History:  Diagnosis Date  . Abnormal Pap smear, atypical squamous cells of undetermined sign (ASC-US) 10/98   CIN 1  . Condyloma acuminata 2001  . Endometriosis 8/95   DepoLupron 4/96-10/96  . Hiatal hernia   . History of campylobacteriosis   . HSV-2 infection   . IBS (irritable bowel syndrome)   . Internal thrombosed hemorrhoids   . Migraine    without aura    Past Surgical History:  Procedure Laterality Date  . AUGMENTATION MAMMAPLASTY Bilateral   . BREAST BIOPSY Left    benign  . BREAST  ENHANCEMENT SURGERY  02/2000  . BREAST MASS EXCISION Right 12/01   fibroadenoma  . EXPLORATORY LAPAROTOMY  8/95   endometriosis    MEDS:   Current Outpatient Medications on File Prior to Visit  Medication Sig Dispense Refill  . gabapentin (NEURONTIN) 100 MG capsule TAKE 1-2 CAPSULES BY MOUTH NIGHTLY 60 capsule 1  . phentermine 15 MG capsule Take 1 capsule (15 mg total) by mouth every morning. 30 capsule 0  . pseudoephedrine (SUDAFED) 30 MG tablet Take 30 mg by mouth every 8 (eight) hours as needed for congestion.    . valACYclovir (VALTREX) 1000 MG tablet TAKE 1 TABLET BY MOUTH DAILY AND 2 TABLETS PER DAY FOR FLARE UPS FOR 3 DAYS 30 tablet 2   No current facility-administered medications on file prior to visit.     ALLERGIES: Aspirin; Doxycycline; Latex; and Sulfonamide derivatives  Family History  Problem Relation Age of Onset  . Irritable bowel syndrome Mother   . Hernia Mother   . Ulcers Maternal Grandfather        gastrectomy  . Leukemia Maternal Grandfather   . Diabetes Maternal Grandfather   . Prostate cancer Maternal Grandfather   . Diabetes Maternal Uncle   . Diabetes Maternal Grandmother   . Coronary artery disease Maternal Grandmother   . Stroke Maternal Grandmother   . Heart failure Maternal Grandmother   . Colon cancer Neg Hx     SH:  Married, non smoker  Review  of Systems  Genitourinary: Positive for vaginal bleeding.    PHYSICAL EXAMINATION:    BP 130/66   Pulse 66   Resp 14   Ht 5\' 6"  (1.676 m)   Wt 169 lb (76.7 kg)   LMP 09/05/2018   BMI 27.28 kg/m     General appearance: alert, cooperative and appears stated age CV:  Regular rate and rhythm Lungs:  clear to auscultation, no wheezes, rales or rhonchi, symmetric air entry  Assessment: Irregular bleeding, perimenopausal Frustrations with weight and BMI 27  Plan: Will start Loloestrin 1 po x day.  Pharmacy savings card given. Phentermine 15mg  daily.  #30/1RF.   Recheck two months if  continues with phentermine, 67months of does not.     ~15 minutes spent with patient >50% of time was in face to face discussion of above.

## 2018-10-28 ENCOUNTER — Other Ambulatory Visit: Payer: Self-pay

## 2018-10-28 ENCOUNTER — Ambulatory Visit: Payer: BLUE CROSS/BLUE SHIELD | Admitting: Obstetrics & Gynecology

## 2018-11-01 ENCOUNTER — Telehealth: Payer: Self-pay | Admitting: *Deleted

## 2018-11-01 NOTE — Telephone Encounter (Signed)
Patient cancelled her recheck appointment. She will call back to reschedule once virus restrictions have been lifted.

## 2018-11-04 ENCOUNTER — Ambulatory Visit: Payer: BLUE CROSS/BLUE SHIELD | Admitting: Obstetrics & Gynecology

## 2018-12-18 ENCOUNTER — Other Ambulatory Visit: Payer: Self-pay | Admitting: Obstetrics & Gynecology

## 2018-12-19 NOTE — Telephone Encounter (Signed)
Medication refill request: Valtrex Last AEX:  07/15/18 SM Next AEX: 07/18/19 Last MMG (if hormonal medication request): 08/29/18 BIRADS 1 negative/density c Refill authorized: Please advise on refill; Order pended #90 w/0 refills if authorized

## 2018-12-29 ENCOUNTER — Telehealth: Payer: Self-pay | Admitting: *Deleted

## 2018-12-29 NOTE — Telephone Encounter (Signed)
PA response received from [facility] for phentermine 15mg  caps, denied.   Call to patient to notify, left detailed message, ok per dpr. Advised as seen above. Advised to return call to office if any additional questions.  Encounter closed.

## 2019-01-26 ENCOUNTER — Other Ambulatory Visit: Payer: Self-pay | Admitting: Obstetrics & Gynecology

## 2019-01-26 NOTE — Telephone Encounter (Signed)
Medication refill request: Valtrex Last AEX:  07-15-18 SM  Next AEX: 07-18-2019 Last MMG (if hormonal medication request): 08-29-2018 BIRADS 1 negative  Refill authorized: Today, please advise.   Medication pended for #90, 0RF. Today, please advise.

## 2019-02-22 ENCOUNTER — Telehealth: Payer: Self-pay | Admitting: Family Medicine

## 2019-02-22 NOTE — Telephone Encounter (Signed)
Please read below. FYI.

## 2019-02-22 NOTE — Telephone Encounter (Signed)
Yes ---- I did agree to see hyer but need covid test----- we can do viirtual if needed

## 2019-02-22 NOTE — Telephone Encounter (Signed)
Pt mother, [name] [name], referred her to Dr. Carollee Herter and states that she verbally agreed to see [name] [name]. Pt is having COVID symptoms and went [facility] drive thru testing [facility] 7/13. She is supposed to have results in 5-7 days.   Please advise on new pt request.

## 2019-02-24 NOTE — Telephone Encounter (Signed)
Are we sending her for testing through [facility]? She had testing done through [facility]. No results yet

## 2019-02-24 NOTE — Telephone Encounter (Signed)
[facility] We can do a virutal in the meantime if she needs it

## 2019-04-19 ENCOUNTER — Emergency Department (HOSPITAL_BASED_OUTPATIENT_CLINIC_OR_DEPARTMENT_OTHER)
Admission: EM | Admit: 2019-04-19 | Discharge: 2019-04-19 | Disposition: A | Discharge status: Home / Self Care | Payer: BC Managed Care – PPO | Attending: Emergency Medicine | Admitting: Emergency Medicine

## 2019-04-19 ENCOUNTER — Other Ambulatory Visit: Payer: Self-pay

## 2019-04-19 ENCOUNTER — Ambulatory Visit: Payer: Self-pay

## 2019-04-19 ENCOUNTER — Encounter (HOSPITAL_BASED_OUTPATIENT_CLINIC_OR_DEPARTMENT_OTHER): Payer: Self-pay | Admitting: Emergency Medicine

## 2019-04-19 ENCOUNTER — Emergency Department (HOSPITAL_BASED_OUTPATIENT_CLINIC_OR_DEPARTMENT_OTHER): Payer: BC Managed Care – PPO

## 2019-04-19 ENCOUNTER — Telehealth: Payer: Self-pay | Admitting: Obstetrics & Gynecology

## 2019-04-19 DIAGNOSIS — R42 Dizziness and giddiness: Secondary | ICD-10-CM | POA: Insufficient documentation

## 2019-04-19 DIAGNOSIS — K2100 Gastro-esophageal reflux disease with esophagitis, without bleeding: Secondary | ICD-10-CM

## 2019-04-19 DIAGNOSIS — R0789 Other chest pain: Secondary | ICD-10-CM

## 2019-04-19 DIAGNOSIS — Z79899 Other long term (current) drug therapy: Secondary | ICD-10-CM | POA: Insufficient documentation

## 2019-04-19 DIAGNOSIS — Z91040 Latex allergy status: Secondary | ICD-10-CM | POA: Diagnosis not present

## 2019-04-19 DIAGNOSIS — R079 Chest pain, unspecified: Secondary | ICD-10-CM | POA: Diagnosis present

## 2019-04-19 LAB — CBC WITH DIFFERENTIAL/PLATELET
Abs Immature Granulocytes: 0.02 10*3/uL (ref 0.00–0.07)
Basophils Absolute: 0 10*3/uL (ref 0.0–0.1)
Basophils Relative: 0 %
Eosinophils Absolute: 0 10*3/uL (ref 0.0–0.5)
Eosinophils Relative: 0 %
HCT: 41.4 % (ref 36.0–46.0)
Hemoglobin: 13.6 g/dL (ref 12.0–15.0)
Immature Granulocytes: 0 %
Lymphocytes Relative: 30 %
Lymphs Abs: 2.1 10*3/uL (ref 0.7–4.0)
MCH: 33.8 pg (ref 26.0–34.0)
MCHC: 32.9 g/dL (ref 30.0–36.0)
MCV: 103 fL — ABNORMAL HIGH (ref 80.0–100.0)
Monocytes Absolute: 0.8 10*3/uL (ref 0.1–1.0)
Monocytes Relative: 11 %
Neutro Abs: 4.1 10*3/uL (ref 1.7–7.7)
Neutrophils Relative %: 59 %
Platelets: 324 10*3/uL (ref 150–400)
RBC: 4.02 MIL/uL (ref 3.87–5.11)
RDW: 12.3 % (ref 11.5–15.5)
WBC: 7 10*3/uL (ref 4.0–10.5)
nRBC: 0 % (ref 0.0–0.2)

## 2019-04-19 LAB — COMPREHENSIVE METABOLIC PANEL
ALT: 19 U/L (ref 0–44)
AST: 23 U/L (ref 15–41)
Albumin: 4.3 g/dL (ref 3.5–5.0)
Alkaline Phosphatase: 48 U/L (ref 38–126)
Anion gap: 12 (ref 5–15)
BUN: 10 mg/dL (ref 6–20)
CO2: 21 mmol/L — ABNORMAL LOW (ref 22–32)
Calcium: 8.8 mg/dL — ABNORMAL LOW (ref 8.9–10.3)
Chloride: 103 mmol/L (ref 98–111)
Creatinine, Ser: 0.63 mg/dL (ref 0.44–1.00)
GFR calc Af Amer: >60 mL/min (ref >60)
GFR calc non Af Amer: >60 mL/min (ref >60)
Glucose, Bld: 100 mg/dL — ABNORMAL HIGH (ref 70–99)
Potassium: 3.9 mmol/L (ref 3.5–5.1)
Sodium: 136 mmol/L (ref 135–145)
Total Bilirubin: 0.9 mg/dL (ref 0.3–1.2)
Total Protein: 7.2 g/dL (ref 6.5–8.1)

## 2019-04-19 LAB — TROPONIN I (HIGH SENSITIVITY): Troponin I (High Sensitivity): <2 ng/L (ref <18)

## 2019-04-19 LAB — LIPASE, BLOOD: Lipase: 25 U/L (ref 11–51)

## 2019-04-19 MED ORDER — LANSOPRAZOLE 15 MG PO CPDR: 15.0000 mg | DELAYED_RELEASE_CAPSULE | Freq: Every day | ORAL | 1 refills | Status: DC

## 2019-04-19 MED FILL — LANSOPRAZOLE 15 MG CPDR: 15 | 14 days supply | Qty: 14 | Fill #0

## 2019-04-19 NOTE — Telephone Encounter (Signed)
Patient is calling regarding heart palpitations, high blood pressure and increased pulse. Patient stated that her blood pressure is 128/78, which she stated is high for her. She stated that her pulse is around 102-106 bpm. Patient transferred to triage nurse at time of incoming call.

## 2019-04-19 NOTE — ED Triage Notes (Signed)
Pt here with resolved chest pain, dizziness, jaw pain. Still feeling "out of it". Stress is high at job.

## 2019-04-19 NOTE — Telephone Encounter (Signed)
Incoming call received from front desk. Patient calling stating she works at [employer] and has been evaluated by Nurse at their facility. Patient states the nurse took her blood pressure which was 128/78 "which is high for me." Patient also states her heart rate was anywhere from 102-106 and states when the nurse put her fingers on her wrist, she said "it was racing." Patient also complaining of heart palpitations, dizziness, tingling fingers, jaw pain and "just feeling out of it." Patient also states her shoulder is hurting, "but I broke my collar bone in the past and it's suppose to rain today." RN advised all of these symptoms warrant further evaluation in the ER. Patient states, "I do not want to go to the ER during a pandemic." RN advised patient ER is best place she could be evaluated for her symptoms. Patient verbalized understanding.   Patient states she is still having a period every other week and they are "really heavy." States LMP was a few days ago. Patient also complaining of hair loss. Patient asking if she should have lab work done? RN advised for patient to go be evaluated in the ER and then return call once she has been seen and can schedule an appointment with Dr. Sabra Heck. Patient agreeable.   Routing to provider for review. Any additional recommendations?

## 2019-04-19 NOTE — ED Provider Notes (Signed)
[facility] EMERGENCY DEPARTMENT Provider Note   CSN: [id number] Arrival date & time: 04/19/19  1116     History   Chief Complaint Chief Complaint  Patient presents with  . Chest Pain  . Dizziness  . Shortness of Breath    HPI [name] [name] is a 48 y.o. female.     The history is provided by the patient.  Chest Pain Pain location:  Epigastric and substernal area Pain quality: burning and dull   Pain radiates to:  R shoulder and R jaw Pain severity:  Moderate Onset quality:  Gradual Duration: started at 4am but resolved about 1 hour ago. Timing:  Constant Progression:  Waxing and waning Chronicity:  New Context comment:  Patient states for the last few weeks she has been under extreme stress from work has had nausea, poor appetite and some epigastric discomfort.  This morning she woke up at 4 AM and said she was having discomfort in her epigastric and chest area  Relieved by:  Nothing Worsened by:  Nothing Ineffective treatments:  None tried Associated symptoms: abdominal pain, dizziness, headache, heartburn, nausea and shortness of breath   Associated symptoms: no anorexia, no back pain, no cough, no fever, no lower extremity edema and no vomiting   Risk factors: no coronary artery disease, no diabetes mellitus, no high cholesterol, no hypertension, not obese, no prior DVT/PE, no smoking and no surgery   Risk factors comment:  Father with hypertension but no significant family history for MI except in a maternal grandmother later in life Dizziness Associated symptoms: chest pain, headaches, nausea and shortness of breath   Associated symptoms: no vomiting   Shortness of Breath Associated symptoms: abdominal pain, chest pain and headaches   Associated symptoms: no cough, no fever and no vomiting     Past Medical History:  Diagnosis Date  . Abnormal Pap smear, atypical squamous cells of undetermined sign (ASC-US) 10/98   CIN 1  . Atypical nevus  07/29/2010   Right Medial Buttock-Moderate  . Atypical nevus x 2 11/15/2006   Upper Right Back-Moderate and Lower Mid Back-Moderate to Marked (w/s)  . Atypical nevus x 3 11/04/2007   Right Side-Slight to Moderate, Right Upper Side-Moderate, and Left Upper Breast-Moderate  . Atypical nevus x 7 10/15/2006   Right Medial Hip-Moderate, Right Middle Hip-Slight, Right Lateral Hip-Mild, Right Axillary Vault-Moderate(w/s), Right Post Calf-Moderate(w/s),Right Inner Buttock-Slight, and Left Mid Back-Moderate(w/s)  . Condyloma acuminata 2001  . Endometriosis 8/95   DepoLupron 4/96-10/96  . Hiatal hernia   . History of campylobacteriosis   . HSV-2 infection   . IBS (irritable bowel syndrome)   . Internal thrombosed hemorrhoids   . Migraine    without aura  . 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 INFECTIOUS COLITIS ENTERITIS AND GASTROENTERITIS 05/14/2009  . CONSTIPATION 05/14/2009  . IRRITABLE BOWEL SYNDROME 05/14/2009  . INTERNAL THROMBOSED HEMORRHOIDS 12/31/2007    Past Surgical History:  Procedure Laterality Date  . AUGMENTATION MAMMAPLASTY Bilateral   . BREAST BIOPSY Left    benign  . BREAST ENHANCEMENT SURGERY  02/2000  . BREAST MASS EXCISION Right 12/01   fibroadenoma  . EXPLORATORY LAPAROTOMY  8/95   endometriosis     OB History    Gravida  0   Para  0   Term  0   Preterm  0   AB  0  Living  0     SAB  0   TAB  0   Ectopic  0   Multiple  0   Live Births  0            Home Medications    Prior to Admission medications   Medication Sig Start Date End Date Taking? Authorizing Provider  gabapentin (NEURONTIN) 100 MG capsule TAKE 1-2 CAPSULES BY MOUTH NIGHTLY 03/15/18   Megan Salon, MD  lansoprazole (PREVACID) 15 MG capsule Take 1 capsule (15 mg total) by mouth daily at 12 noon. 04/19/19   Blanchie Dessert, MD  phentermine 15 MG capsule Take 1  capsule (15 mg total) by mouth every morning. 09/13/18   Megan Salon, MD  pseudoephedrine (SUDAFED) 30 MG tablet Take 30 mg by mouth every 8 (eight) hours as needed for congestion.    [provider]  valACYclovir (VALTREX) 1000 MG tablet TAKE 1 TABLET BY MOUTH DAILY AND 2 TABLETS PER DAY FOR FLARE UPS FOR 3 DAYS 01/26/19   Megan Salon, MD    Family History Family History  Problem Relation Age of Onset  . Irritable bowel syndrome Mother   . Hernia Mother   . Ulcers Maternal Grandfather        gastrectomy  . Leukemia Maternal Grandfather   . Diabetes Maternal Grandfather   . Prostate cancer Maternal Grandfather   . Diabetes Maternal Uncle   . Diabetes Maternal Grandmother   . Coronary artery disease Maternal Grandmother   . Stroke Maternal Grandmother   . Heart failure Maternal Grandmother   . Colon cancer Neg Hx     Social History Social History   Tobacco Use  . Smoking status: Never Smoker  . Smokeless tobacco: Never Used  Substance Use Topics  . Alcohol use: No    Comment: occasionally  . Drug use: No     Allergies   Aspirin, Doxycycline, Latex, and Sulfonamide derivatives   Review of Systems Review of Systems  Constitutional: Negative for fever.  Respiratory: Positive for shortness of breath. Negative for cough.   Cardiovascular: Positive for chest pain.  Gastrointestinal: Positive for abdominal pain, heartburn and nausea. Negative for anorexia and vomiting.  Musculoskeletal: Negative for back pain.  Neurological: Positive for dizziness and headaches.  All other systems reviewed and are negative.    Physical Exam Updated Vital Signs BP 129/88   Pulse 83   Temp 98.1 F (36.7 C) (Oral)   Resp 17   SpO2 99%   Physical Exam Vitals signs and nursing note reviewed.  Constitutional:      General: She is not in acute distress.    Appearance: She is well-developed and normal weight.  HENT:     Head: Normocephalic and atraumatic.  Eyes:      Pupils: Pupils are equal, round, and reactive to light.  Cardiovascular:     Rate and Rhythm: Normal rate and regular rhythm.     Heart sounds: Normal heart sounds. No murmur. No friction rub.  Pulmonary:     Effort: Pulmonary effort is normal.     Breath sounds: Normal breath sounds. No wheezing or rales.  Chest:     Chest wall: No tenderness.  Abdominal:     General: Bowel sounds are normal. There is no distension.     Palpations: Abdomen is soft.     Tenderness: There is abdominal tenderness in the epigastric area. There is no guarding or rebound.  Musculoskeletal: Normal range of motion.  General: No tenderness.     Comments: No edema  Skin:    General: Skin is warm and dry.     Findings: No rash.  Neurological:     Mental Status: She is alert and oriented to person, place, and time.     Cranial Nerves: No cranial nerve deficit.  Psychiatric:        Behavior: Behavior normal.      ED Treatments / Results  Labs (all labs ordered are listed, but only abnormal results are displayed) Labs Reviewed  CBC WITH DIFFERENTIAL/PLATELET - Abnormal; Notable for the following components:      Result Value   MCV 103.0 (*)    All other components within normal limits  COMPREHENSIVE METABOLIC PANEL - Abnormal; Notable for the following components:   CO2 21 (*)    Glucose, Bld 100 (*)    Calcium 8.8 (*)    All other components within normal limits  LIPASE, BLOOD  TROPONIN I (HIGH SENSITIVITY)  TROPONIN I (HIGH SENSITIVITY)    EKG EKG Interpretation  Date/Time:  Wednesday April 19 2019 11:32:51 EDT Ventricular Rate:  79 PR Interval:    QRS Duration: 86 QT Interval:  383 QTC Calculation: 439 R Axis:   92 Text Interpretation:  Sinus rhythm Borderline right axis deviation Baseline wander in lead(s) V6 No significant change since last tracing Confirmed by Blanchie Dessert [id number]) on 04/19/2019 11:43:41 AM   Radiology Dg Chest Port 1 View  Result Date: 04/19/2019  CLINICAL DATA:  Chest pain and cardiac palpitations EXAM: PORTABLE CHEST 1 VIEW COMPARISON:  None. FINDINGS: Lungs are clear. Heart size and pulmonary vascularity are normal. No adenopathy. No pneumothorax. No bone lesions. IMPRESSION: No edema or consolidation. Electronically Signed   By: Lowella Grip III M.D.   On: 04/19/2019 12:16    Procedures Procedures (including critical care time)  Medications Ordered in ED Medications - No data to display   Initial Impression / Assessment and Plan / ED Course  I have reviewed the triage vital signs and the nursing notes.  Pertinent labs & imaging results that were available during my care of the patient were reviewed by me and considered in my medical decision making (see chart for details).        Patient is a 48 year old female who is relatively healthy presenting today with atypical chest pain.  Woke her up at 4 AM this morning and progressed throughout the morning to then radiate into her right arm and jaw.  She states during this time she started feeling very anxious had some mild shortness of breath and has had ongoing nausea.  Patient states for the last several weeks she has had nausea and abdominal pain which she attributes to significant stress at work.  She drinks 1 to 2 glasses of wine every evening but does not feel like that has increased recently.  She does not take excessive NSAIDs or aspirin products.  She currently has no pain at this time.  Patient is well-appearing on exam with normal vital signs.  EKG without acute findings, CBC, CMP, lipase, chest x-ray and troponin are all within normal limits.  Patient has a heart score less than 3 and symptoms have been ongoing more than 3 hours with a troponin of less than 2.  Feel that she is cleared for ACS from a cardiac standpoint.  Suspect this is most likely GI in nature potentially exacerbated by recent increased stress.  Patient will be started on Prevacid and will  follow-up with GI  her regular doctor as needed if symptoms do not improve.  Low suspicion for PE, dissection, acute lung pathology, tamponade or pericarditis.  Final Clinical Impressions(s) / ED Diagnoses   Final diagnoses:  Atypical chest pain  Gastroesophageal reflux disease with esophagitis    ED Discharge Orders         Ordered    lansoprazole (PREVACID) 15 MG capsule  Daily     04/19/19 1348           Blanchie Dessert, MD 04/19/19 [phone]

## 2019-04-19 NOTE — Telephone Encounter (Signed)
Call to patient. Message given to patient as seen below from Dr. Sabra Heck. Patient verbalized understanding. Currently is on her way to [place] to be evaluated. Will return call to office to give update after she has been seen.

## 2019-04-19 NOTE — Telephone Encounter (Signed)
Incoming call from Patient   With a compliant of feeling dizzy  and tingly sensation in hands and arms.  Reports shoulder pain also Standing aggravates the dizziness.  Onset was early this morning.  Heart rate was 106.  Reviewed Protocol with patient.  Recommended Patient go to ED or Urgent care.  Patient declined due to the potential of Covid-19 Patients around.  Patient states that she will go to Urgent Care.   Reason for Disposition . [1] Numbness (i.e., loss of sensation) of the face, arm or leg on one side of the body AND [2] sudden onset AND [3] present now  Answer Assessment - Initial Assessment Questions 1. DESCRIPTION: "Describe your dizziness."     shoulder pain,  128/78 Heart rate 106 2. LIGHTHEADED: "Do you feel lightheaded?" (e.g., somewhat faint, woozy, weak upon standing)     *No Answer* 3. VERTIGO: "Do you feel like either you or the room is spinning or tilting?" (i.e. vertigo)     *No Answer* 4. SEVERITY: "How bad is it?"  "Do you feel like you are going to faint?" "Can you stand and walk?"   - MILD - walking normally   - MODERATE - interferes with normal activities (e.g., work, school)    - SEVERE - unable to stand, requires support to walk, feels like passing out now.      mild 5. ONSET:  "When did the dizziness begin?"     Early this morningGRAVATING FACTORS: "Does anything make it worse?" (e.g., standing, change in head position)     Standing.   7. HEART RATE: "Can you tell me your heart rate?" "How many beats in 15 seconds?"  (Note: not all patients can do this)      106 8. CAUSE: "What do you think is causing the dizziness?"     no 9. RECURRENT SYMPTOM: "Have you had dizziness before?" If so, ask: "When was the last time?" "What happened that time?"     A year  10. OTHER SYMPTOMS: "Do you have any other symptoms?" (e.g., fever, chest pain, vomiting, diarrhea, bleeding)      Diarrhea  Yesterday.  Over the weekend 20 to 3 11. PREGNANCY: "Is there any chance you are  pregnant?" "When was your last menstrual period?"      Ended two days ago.  Protocols used: DIZZINESS [facility]

## 2019-04-19 NOTE — Telephone Encounter (Signed)
She could be seen in urgent care as well for the racing pulse issue.  She does not have a PCP and I cannot do any significant cardiovascular evaluation.  Once this is completed, please have her call back and we can proceed with hormonal testing.  I do want her to have her heart evaluated first.  Thanks.

## 2019-04-20 ENCOUNTER — Telehealth: Payer: Self-pay | Admitting: Family Medicine

## 2019-04-20 NOTE — Telephone Encounter (Signed)
Called patient to schedule NPV okay'd by Dr. Carollee Herter. Left voicemail to return call.

## 2019-04-24 ENCOUNTER — Telehealth: Payer: Self-pay | Admitting: *Deleted

## 2019-04-24 NOTE — Telephone Encounter (Signed)
Note in computer that Dr. Zola Button for new patient appt   Initial Comment Caller is needing to schedule a new patient appt.

## 2019-04-29 ENCOUNTER — Other Ambulatory Visit: Payer: Self-pay | Admitting: Obstetrics & Gynecology

## 2019-05-01 NOTE — Telephone Encounter (Signed)
Medication refill request: Valtrex Last AEX:  07/15/2018 SM Next AEX: 07/18/2019 Last MMG (if hormonal medication request): BIRADS 1 Negative Density C Refill authorized: Pending #[phone] with no refills if appropriate. Please advise.

## 2019-05-01 NOTE — Telephone Encounter (Signed)
Patient is calling to schedule ER follow up. Patient is requesting lab work.

## 2019-05-01 NOTE — Telephone Encounter (Signed)
Spoke with patient. Patient requesting OV for labs. Seen in ER on 9/9, was advised to f/u with PCP for further evaluation Patient states she has not scheduled with PCP to date, she has called. Patient requesting OV with Dr. Sabra Heck to discuss hair loss and irregular menses. LMP 04/22/19. Not currently bleeding. Hgb 13.6 in ER on 9/9. Reports heavy menses q2 weeks, weight gain, emotional changes and insomnia. Wants to discuss HRT options.   OV scheduled for 9/28 at 10:30am, patient declined earlier OV on 9/24 at 9:30am.   Advised Dr. Sabra Heck will review, our office will return call if any additional recommendations. Advised patient to return call to office if new symptoms develop or symptoms worsen. Patient agreeable.   Routing to provider for final review. Patient is agreeable to disposition. Will close encounter.

## 2019-05-01 NOTE — Telephone Encounter (Signed)
OK to close encounter or is further follow up necessary? °

## 2019-05-03 ENCOUNTER — Other Ambulatory Visit: Payer: Self-pay

## 2019-05-03 NOTE — Telephone Encounter (Signed)
Patient called office to set up her new patient appt Patient call back  [phone]

## 2019-05-04 ENCOUNTER — Ambulatory Visit: Payer: Self-pay

## 2019-05-04 NOTE — Telephone Encounter (Signed)
Incoming call from Patient with a complaint of feeling dizzy States that she is dizzy even when lying down.  Rates it severe.  Onset was 2 days. Ago. Turning head aggravates it. Heart rate is 96.  Reports nausea.  Pt. Would be an Acute visit.  Office recommended that she go to Urgent Care.  For evaluation.     Reason for Disposition . [1] MODERATE dizziness (e.g., interferes with normal activities) AND [2] has NOT been evaluated by physician for this  (Exception: dizziness caused by heat exposure, sudden standing, or poor fluid intake)  Answer Assessment - Initial Assessment Questions 1. DESCRIPTION: "Describe your dizziness."   Laying in bed turn over dizzy have to catch my turn head  Quickly  2. LIGHTHEADED: "Do you feel lightheaded?" (e.g., somewhat faint, woozy, weak upon standing)     *No Answer* 3. VERTIGO: "Do you feel like either you or the room is spinning or tilting?" (i.e. vertigo)     yes 4. SEVERITY: "How bad is it?"  "Do you feel like you are going to faint?" "Can you stand and walk?"   - MILD - walking normally   - MODERATE - interferes with normal activities (e.g., work, school)    - SEVERE - unable to stand, requires support to walk, feels like passing out now.      severe 5. ONSET:  "When did the dizziness begin?"     2days ago 6. AGGRAVATING FACTORS: "Does anything make it worse?" (e.g., standing, change in head position)     Turning head 7. HEART RATE: "Can you tell me your heart rate?" "How many beats in 15 seconds?"  (Note: not all patients can do this)       24 8. CAUSE: "What do you think is causing the dizziness?"     * deniesCURRENT SYMPTOM: "Have you had dizziness before?" If so, ask: "When was the last time?" "What happened that time?"      10. OTHER SYMPTOMS: "Do you have any other symptoms?" (e.g., fever, chest pain, vomiting, diarrhea, bleeding)       Nauseated  11. PREGNANCY: "Is there any chance you are pregnant?" "When was your last menstrual period?"     2 weeks ago  Protocols used: [facility]

## 2019-05-08 ENCOUNTER — Encounter: Payer: Self-pay | Admitting: Obstetrics & Gynecology

## 2019-05-08 ENCOUNTER — Other Ambulatory Visit: Payer: Self-pay

## 2019-05-08 ENCOUNTER — Ambulatory Visit (INDEPENDENT_AMBULATORY_CARE_PROVIDER_SITE_OTHER): Payer: BC Managed Care – PPO | Admitting: Obstetrics & Gynecology

## 2019-05-08 VITALS — BP 110/80 | HR 92 | Temp 97.2°F | Ht 66.0 in | Wt 174.2 lb

## 2019-05-08 DIAGNOSIS — N959 Unspecified menopausal and perimenopausal disorder: Secondary | ICD-10-CM | POA: Diagnosis not present

## 2019-05-08 NOTE — Progress Notes (Signed)
GYNECOLOGY  VISIT  CC:   Menopausal symptoms  HPI: 48 y.o. G0P0000 Married White or Caucasian female here for menopause symptoms.  Reports she doesn't have any energy.  She has a lot of work stress and her boss is getting worse and worse.  She is considering making a change.  Feel environment at work is toxic.  She is having trouble sleeping.  Feels "stresed out" much of the time.  Feels she is having hair loss.    She did have atypical chest pain in early August.  [facility] and she had EKG and troponin levels that were normal.    When she is on OCPs, she has increased bleeding.    Last ultrasound was 4/19 and endometrial biopsy was 07/2019.    GYNECOLOGIC HISTORY: Patient's last menstrual period was 04/20/2019 (approximate). Contraception: none Menopausal hormone therapy: none  Patient Active Problem List   Diagnosis Date Noted  . INFECTIOUS COLITIS ENTERITIS AND GASTROENTERITIS 05/14/2009  . CONSTIPATION 05/14/2009  . IRRITABLE BOWEL SYNDROME 05/14/2009  . INTERNAL THROMBOSED HEMORRHOIDS 12/31/2007    Past Medical History:  Diagnosis Date  . Abnormal Pap smear, atypical squamous cells of undetermined sign (ASC-US) 10/98   CIN 1  . Atypical nevus 07/29/2010   Right Medial Buttock-Moderate  . Atypical nevus x 2 11/15/2006   Upper Right Back-Moderate and Lower Mid Back-Moderate to Marked (w/s)  . Atypical nevus x 3 11/04/2007   Right Side-Slight to Moderate, Right Upper Side-Moderate, and Left Upper Breast-Moderate  . Atypical nevus x 7 10/15/2006   Right Medial Hip-Moderate, Right Middle Hip-Slight, Right Lateral Hip-Mild, Right Axillary Vault-Moderate(w/s), Right Post Calf-Moderate(w/s),Right Inner Buttock-Slight, and Left Mid Back-Moderate(w/s)  . Condyloma acuminata 2001  . Endometriosis 8/95   DepoLupron 4/96-10/96  . Hiatal hernia   . History of campylobacteriosis   . HSV-2 infection   . IBS (irritable bowel syndrome)   . Internal  thrombosed hemorrhoids   . Migraine    without aura  . 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 AUGMENTATION MAMMAPLASTY Bilateral   . BREAST BIOPSY Left    benign  . BREAST ENHANCEMENT SURGERY  02/2000  . BREAST MASS EXCISION Right 12/01   fibroadenoma  . EXPLORATORY LAPAROTOMY  8/95   endometriosis    MEDS:   Current Outpatient Medications on File Prior to Visit  Medication Sig Dispense Refill  . meclizine (ANTIVERT) 25 MG tablet     . ondansetron (ZOFRAN) 4 MG tablet     . valACYclovir (VALTREX) 1000 MG tablet TAKE 1 TABLET BY MOUTH DAILY AND 2 TABLETS PER DAY FOR FLARE UPS FOR 3 DAYS 90 tablet 0   No current facility-administered medications on file prior to visit.     ALLERGIES: Aspirin, Doxycycline, Latex, and Sulfonamide derivatives  Family History  Problem Relation Age of Onset  . Irritable bowel syndrome Mother   . Hernia Mother   . Ulcers Maternal Grandfather        gastrectomy  . Leukemia Maternal Grandfather   . Diabetes Maternal Grandfather   . Prostate cancer Maternal Grandfather   . Diabetes Maternal Uncle   . Diabetes Maternal Grandmother   . Coronary artery disease Maternal Grandmother   . Stroke Maternal Grandmother   . Heart failure Maternal Grandmother   . Colon cancer Neg Hx     SH:  Married, non smoker  Review of Systems  Genitourinary: Positive for menstrual problem.  All other systems reviewed and are negative.   PHYSICAL EXAMINATION:    BP 110/80   Pulse 92   Temp (!) 97.2 F (36.2 C) (Temporal)   Ht 5\' 6"  (1.676 m)   Wt 174 lb 3.2 oz (79 kg)   LMP 04/20/2019 (Approximate)   BMI 28.12 kg/m     General appearance: alert, cooperative and appears stated age   Assessment: Perimenopausal symptoms  Plan: FSH, estradiol TSH will also be obtianed Iron level, ferritin    ~15 minutes spent with patient >50% of time was in face  to face discussion of above.

## 2019-05-09 ENCOUNTER — Ambulatory Visit: Payer: BC Managed Care – PPO | Admitting: Family Medicine

## 2019-05-09 LAB — ESTRADIOL: Estradiol: 31.2 pg/mL

## 2019-05-09 LAB — IRON,TIBC AND FERRITIN PANEL
Ferritin: 55 ng/mL (ref 15–150)
Iron Saturation: 13 % — ABNORMAL LOW (ref 15–55)
Iron: 47 ug/dL (ref 27–159)
Total Iron Binding Capacity: 360 ug/dL (ref 250–450)
UIBC: 313 ug/dL (ref 131–425)

## 2019-05-09 LAB — FOLLICLE STIMULATING HORMONE: FSH: 12 m[IU]/mL

## 2019-05-09 LAB — TSH: TSH: 1.84 u[IU]/mL (ref 0.450–4.500)

## 2019-05-10 ENCOUNTER — Telehealth: Payer: Self-pay | Admitting: *Deleted

## 2019-05-10 DIAGNOSIS — N926 Irregular menstruation, unspecified: Secondary | ICD-10-CM

## 2019-05-10 NOTE — Telephone Encounter (Signed)
Patient returned call. Results reviewed with patient as seen below from Dr. Sabra Heck and patient verbalized understanding. PUS scheduled for 05-18-2019 at 1300 with 1330 consult with Dr. Sabra Heck. Patient agreeable to date and time of appointment. Future order placed.   Routing to provider and will close encounter.

## 2019-05-10 NOTE — Telephone Encounter (Signed)
-----   Message from Megan Salon, MD sent at 05/10/2019 10:56 AM EDT ----- Please let pt know her Kane was 12 (premenopausal range) and her estradiol was fine at 31.  Her iron levels were normal as well.  Due to her irregular bleeding, I think we should repeat the ultrasound.  Please schedule.

## 2019-05-10 NOTE — Telephone Encounter (Signed)
Notes recorded by Burnice Logan, RN on 05/10/2019 at 10:59 AM EDT  Left message to call Sharee Pimple, RN at [facility].

## 2019-05-10 NOTE — Telephone Encounter (Signed)
Message left to return call to Triage Nurse at [phone].    

## 2019-05-10 NOTE — Telephone Encounter (Signed)
Patient left voicemail during lunch returning call to [place].

## 2019-05-12 ENCOUNTER — Telehealth: Payer: Self-pay | Admitting: Obstetrics & Gynecology

## 2019-05-12 NOTE — Telephone Encounter (Signed)
Call placed to convey benefits for ultrasound. Spoke with patient she understands/agreeable with the benefits. Patient is aware of the cancellation policy. Appointment scheduled 05/18/19.

## 2019-05-16 ENCOUNTER — Encounter: Payer: Self-pay | Admitting: Gastroenterology

## 2019-05-18 ENCOUNTER — Ambulatory Visit (INDEPENDENT_AMBULATORY_CARE_PROVIDER_SITE_OTHER): Payer: BC Managed Care – PPO

## 2019-05-18 ENCOUNTER — Other Ambulatory Visit: Payer: Self-pay

## 2019-05-18 ENCOUNTER — Ambulatory Visit (INDEPENDENT_AMBULATORY_CARE_PROVIDER_SITE_OTHER): Payer: BC Managed Care – PPO | Admitting: Obstetrics & Gynecology

## 2019-05-18 VITALS — BP 120/78 | HR 76 | Temp 97.6°F | Ht 66.0 in | Wt 174.8 lb

## 2019-05-18 DIAGNOSIS — N800 Endometriosis of uterus: Secondary | ICD-10-CM

## 2019-05-18 DIAGNOSIS — N926 Irregular menstruation, unspecified: Secondary | ICD-10-CM | POA: Diagnosis not present

## 2019-05-18 DIAGNOSIS — N8003 Adenomyosis of the uterus: Secondary | ICD-10-CM

## 2019-05-18 NOTE — Progress Notes (Signed)
48 y.o. G0P0000 Married White or Caucasian female here for pelvic ultrasound due to irregular menstrual bleeding.  Pt has been experiencing this for over a year.  Did have PUS last year.  As well, had ndometrial biopsy on 12/20 showing:   - FRAGMENTS OF BENIGN SECRETORY ENDOMETRIUM WITH GLANDULAR AND STROMAL BREAKDOWN. - NO HYPERPLASIA OR MALIGNANCY IDENTIFIED.  She did take micronor for two months without improvement in bleeding.  She also took Loloestrin and this did not seem to help either.  She continues to be frustrated by weight.    Continued to have very stressful work environment.  She decided to turn in 30 day notice last Thursday.  Monday was her last day.  Her direct report had her leave on Monday.  She is very sad about this and was not able to have closure.  So, even quitting doesn't seem to have helped.  Lab work last week showed [place] of 12 and estradiol of 31.  Patient's last menstrual period was 04/20/2019 (approximate).  Contraception: none  Findings:  UTERUS:  8.0 x 5.5 x 4.4cm, showing c/w adenomyosis EMS: 7.65mm ADNEXA: Left ovary: 2.8 x 1.9 x 0000000 with [id number] follicle       Right ovary: 1.7 x 1.2 x 1.1cm CUL DE SAC: no free fluid  Discussion:  Findings reviewed.  As had endometrial biopsy 10 months ago, do not feel this needs repeating at this time.  Discussed D&C with pt.  Oral progesterone and Depo Provera discussed.  Mirena IUD discussed.  She does not have an interest in any of the above, although she did take information with her.  Hysterectomy discussed.  Declines.  I really do think she needs to try at least something else for the bleeding but she is not interested in anything we discussed.  Also offered to have her see one of my partners or refer for second opinion.  She does not want this either.  Assessment:  Perimenopausal bleeding  Plan:  Discussed with pt options for bleeding which she is not interested in at this time.  I do not feel I am helping her at  all at this point.  She did take information about IUD but I am doubtful she will proceed with this.  She does know that I would like to be able to help with her bleeding issues but I cannot without deciding about another option that we have not yet tried.    ~25 minutes spent with patient >50% of time was in face to face discussion of above.

## 2019-05-19 ENCOUNTER — Encounter: Payer: Self-pay | Admitting: Obstetrics & Gynecology

## 2019-05-19 DIAGNOSIS — N8003 Adenomyosis of the uterus: Secondary | ICD-10-CM | POA: Insufficient documentation

## 2019-05-19 DIAGNOSIS — N800 Endometriosis of uterus: Secondary | ICD-10-CM | POA: Insufficient documentation

## 2019-06-02 ENCOUNTER — Other Ambulatory Visit: Payer: Self-pay

## 2019-06-02 ENCOUNTER — Ambulatory Visit (INDEPENDENT_AMBULATORY_CARE_PROVIDER_SITE_OTHER): Payer: BC Managed Care – PPO | Admitting: Family Medicine

## 2019-06-02 ENCOUNTER — Encounter: Payer: Self-pay | Admitting: Family Medicine

## 2019-06-02 VITALS — BP 100/68 | HR 91 | Temp 97.8°F | Resp 18 | Ht 66.0 in | Wt 176.6 lb

## 2019-06-02 DIAGNOSIS — Z0000 Encounter for general adult medical examination without abnormal findings: Secondary | ICD-10-CM

## 2019-06-02 DIAGNOSIS — R110 Nausea: Secondary | ICD-10-CM | POA: Diagnosis not present

## 2019-06-02 DIAGNOSIS — R635 Abnormal weight gain: Secondary | ICD-10-CM | POA: Diagnosis not present

## 2019-06-02 LAB — LIPID PANEL
Cholesterol: 238 mg/dL — ABNORMAL HIGH (ref 0–200)
HDL: 73.1 mg/dL (ref >39.00)
LDL Cholesterol: 153 mg/dL — ABNORMAL HIGH (ref 0–99)
NonHDL: 164.95
Total CHOL/HDL Ratio: 3
Triglycerides: 60 mg/dL (ref 0.0–149.0)
VLDL: 12 mg/dL (ref 0.0–40.0)

## 2019-06-02 LAB — CBC WITH DIFFERENTIAL/PLATELET
Basophils Absolute: 0.1 10*3/uL (ref 0.0–0.1)
Basophils Relative: 0.7 % (ref 0.0–3.0)
Eosinophils Absolute: 0 10*3/uL (ref 0.0–0.7)
Eosinophils Relative: 0.3 % (ref 0.0–5.0)
HCT: 40.7 % (ref 36.0–46.0)
Hemoglobin: 13.7 g/dL (ref 12.0–15.0)
Lymphocytes Relative: 24.7 % (ref 12.0–46.0)
Lymphs Abs: 2 10*3/uL (ref 0.7–4.0)
MCHC: 33.8 g/dL (ref 30.0–36.0)
MCV: 102.8 fl — ABNORMAL HIGH (ref 78.0–100.0)
Monocytes Absolute: 0.9 10*3/uL (ref 0.1–1.0)
Monocytes Relative: 11 % (ref 3.0–12.0)
Neutro Abs: 5.2 10*3/uL (ref 1.4–7.7)
Neutrophils Relative %: 63.3 % (ref 43.0–77.0)
Platelets: 303 10*3/uL (ref 150.0–400.0)
RBC: 3.96 Mil/uL (ref 3.87–5.11)
RDW: 12.5 % (ref 11.5–15.5)
WBC: 8.2 10*3/uL (ref 4.0–10.5)

## 2019-06-02 LAB — COMPREHENSIVE METABOLIC PANEL
ALT: 17 U/L (ref 0–35)
AST: 20 U/L (ref 0–37)
Albumin: 4.5 g/dL (ref 3.5–5.2)
Alkaline Phosphatase: 57 U/L (ref 39–117)
BUN: 11 mg/dL (ref 6–23)
CO2: 25 mEq/L (ref 19–32)
Calcium: 9.3 mg/dL (ref 8.4–10.5)
Chloride: 103 mEq/L (ref 96–112)
Creatinine, Ser: 0.74 mg/dL (ref 0.40–1.20)
GFR: 83.11 mL/min (ref >60.00)
Glucose, Bld: 80 mg/dL (ref 70–99)
Potassium: 5.2 mEq/L — ABNORMAL HIGH (ref 3.5–5.1)
Sodium: 138 mEq/L (ref 135–145)
Total Bilirubin: 0.5 mg/dL (ref 0.2–1.2)
Total Protein: 7 g/dL (ref 6.0–8.3)

## 2019-06-02 MED ORDER — FAMOTIDINE 20 MG PO TABS: 20.0000 mg | ORAL_TABLET | Freq: Two times a day (BID) | ORAL | 2 refills | Status: DC

## 2019-06-02 MED ORDER — ONDANSETRON HCL 4 MG PO TABS: 4.0000 mg | ORAL_TABLET | Freq: Three times a day (TID) | ORAL | 0 refills | Status: DC | PRN

## 2019-06-02 NOTE — Assessment & Plan Note (Signed)
With reflux? pepcid bid Consider gi if no improvement

## 2019-06-02 NOTE — Patient Instructions (Signed)

## 2019-06-02 NOTE — Assessment & Plan Note (Signed)
ghm utd Check labs See AVS 

## 2019-06-02 NOTE — Assessment & Plan Note (Signed)
Pt exercising regularly Check labs

## 2019-06-02 NOTE — Progress Notes (Signed)
Patient ID: [name] [name], female    DOB: [birth year]  Age: 48 y.o. MRN: [id number]    Subjective:  Subjective  HPI [name] [name] presents to establish.  Pt is struggling with some nausea and reflux but she thinks its from stress.  She just quit her job and is Review of Systems  Constitutional: Negative for activity change, appetite change, fatigue and unexpected weight change.  Respiratory: Negative for cough and shortness of breath.   Cardiovascular: Negative for chest pain and palpitations.  Gastrointestinal: Positive for nausea. Negative for abdominal pain, diarrhea and rectal pain.  Psychiatric/Behavioral: Negative for behavioral problems and dysphoric mood. The patient is not nervous/anxious.     History Past Medical History:  Diagnosis Date  . Abnormal Pap smear, atypical squamous cells of undetermined sign (ASC-US) 10/98   CIN 1  . Atypical nevus 07/29/2010   Right Medial Buttock-Moderate  . Atypical nevus x 2 11/15/2006   Upper Right Back-Moderate and Lower Mid Back-Moderate to Marked (w/s)  . Atypical nevus x 3 11/04/2007   Right Side-Slight to Moderate, Right Upper Side-Moderate, and Left Upper Breast-Moderate  . Atypical nevus x 7 10/15/2006   Right Medial Hip-Moderate, Right Middle Hip-Slight, Right Lateral Hip-Mild, Right Axillary Vault-Moderate(w/s), Right Post Calf-Moderate(w/s),Right Inner Buttock-Slight, and Left Mid Back-Moderate(w/s)  . Condyloma acuminata 2001  . Endometriosis 8/95   DepoLupron 4/96-10/96  . Hiatal hernia   . History of campylobacteriosis   . HSV-2 infection   . IBS (irritable bowel syndrome)   .  Internal thrombosed hemorrhoids   . Migraine    without aura  . 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   Her family history includes Coronary artery disease in her maternal grandmother; Diabetes in her maternal grandfather, maternal grandmother, and maternal uncle; Heart failure in her maternal grandmother; Hernia in her mother; Irritable bowel syndrome in her mother; Leukemia in her maternal grandfather; Prostate cancer in her maternal grandfather; Stroke in her maternal grandmother; Ulcers in her maternal grandfather.She reports that she has never smoked. She has never used smokeless tobacco. She reports current alcohol use of about 10.0 standard drinks of alcohol per week. She reports that she does not use drugs.  Current Outpatient Medications on File Prior to Visit  Medication Sig Dispense Refill  . meclizine (  ANTIVERT) 25 MG tablet     . valACYclovir (VALTREX) 1000 MG tablet TAKE 1 TABLET BY MOUTH DAILY AND 2 TABLETS PER DAY FOR FLARE UPS FOR 3 DAYS (Patient not taking: Reported on 06/02/2019) 90 tablet 0   No current facility-administered medications on file prior to visit.      Objective:  Objective  Physical Exam Vitals signs and nursing note reviewed.  Constitutional:      Appearance: She is well-developed.  HENT:     Head: Normocephalic and atraumatic.  Eyes:     Conjunctiva/sclera: Conjunctivae normal.  Neck:     Musculoskeletal: Normal range of motion and neck supple.     Thyroid: No thyromegaly.     Vascular: No carotid bruit or JVD.  Cardiovascular:     Rate and Rhythm: Normal rate and regular rhythm.     Heart sounds: Normal heart sounds. No murmur.  Pulmonary:     Effort: Pulmonary effort is normal. No  respiratory distress.     Breath sounds: Normal breath sounds. No wheezing or rales.  Chest:     Chest wall: No tenderness.  Neurological:     Mental Status: She is alert and oriented to person, place, and time.    BP 100/68 (BP Location: Right Arm, Patient Position: Sitting, Cuff Size: Normal)   Pulse 91   Temp 97.8 F (36.6 C) (Temporal)   Resp 18   Ht 5\' 6"  (1.676 m)   Wt 176 lb 9.6 oz (80.1 kg)   SpO2 96%   BMI 28.50 kg/m  Wt Readings from Last 3 Encounters:  06/02/19 176 lb 9.6 oz (80.1 kg)  05/18/19 174 lb 12.8 oz (79.3 kg)  05/08/19 174 lb 3.2 oz (79 kg)     Lab Results  Component Value Date   WBC 7.0 04/19/2019   HGB 13.6 04/19/2019   HCT 41.4 04/19/2019   PLT 324 04/19/2019   GLUCOSE 100 (H) 04/19/2019   CHOL 256 (H) 08/11/2018   TRIG 81 08/11/2018   HDL 75 08/11/2018   LDLCALC 165 (H) 08/11/2018   ALT 19 04/19/2019   AST 23 04/19/2019   NA 136 04/19/2019   K 3.9 04/19/2019   CL 103 04/19/2019   CREATININE 0.63 04/19/2019   BUN 10 04/19/2019   CO2 21 (L) 04/19/2019   TSH 1.840 05/08/2019   HGBA1C 4.8 04/03/2016    Dg Chest Port 1 View  Result Date: 04/19/2019 CLINICAL DATA:  Chest pain and cardiac palpitations EXAM: PORTABLE CHEST 1 VIEW COMPARISON:  None. FINDINGS: Lungs are clear. Heart size and pulmonary vascularity are normal. No adenopathy. No pneumothorax. No bone lesions. IMPRESSION: No edema or consolidation. Electronically Signed   By: Lowella Grip III M.D.   On: 04/19/2019 12:16     Assessment & Plan:  Plan  I have changed Mercadies S. Lawther's ondansetron. I am also having her start on famotidine. Additionally, I am having her maintain her valACYclovir and meclizine.  Meds ordered this encounter  Medications  . ondansetron (ZOFRAN) 4 MG tablet    Sig: Take 1 tablet (4 mg total) by mouth every 8 (eight) hours as needed for nausea or vomiting.    Dispense:  30 tablet    Refill:  0  . famotidine (PEPCID) 20 MG tablet    Sig: Take 1  tablet (20 mg total) by mouth 2 (two) times daily.    Dispense:  60 tablet    Refill:  2    Problem List Items Addressed  This Visit      Unprioritized   Nausea    With reflux? pepcid bid Consider gi if no improvement      Relevant Medications   ondansetron (ZOFRAN) 4 MG tablet   famotidine (PEPCID) 20 MG tablet   Preventative health care - Primary    ghm utd Check labs See AVS      Relevant Orders   CBC with Differential/Platelet   Lipid panel   Comprehensive metabolic panel   Thyroid Panel With TSH   Weight gain    Pt exercising regularly Check labs       Relevant Orders   Thyroid Panel With TSH      Follow-up: Return in about 1 year (around 06/01/2020), or if symptoms worsen or fail to improve, for annual exam, fasting.  Ann Held, DO              Subjective:     [name] [name] is a 48 y.o. female and is here for a comprehensive physical exam. The patient reports problems - nausea and reflux-- she used to see Dr Olevia Perches-- she does think the stress of losing her job is contributing .  Social History   Socioeconomic History  . Marital status: Married    Spouse name: Not on file  . Number of children: 0  . Years of education: Not on file  . Highest education level: Not on file  Occupational History    Employer: [employer]  Social Needs  . Financial resource strain: Not on file  . Food insecurity    Worry: Not on file    Inability: Not on file  . Transportation needs    Medical: Not on file    Non-medical: Not on file  Tobacco Use  . Smoking status: Never Smoker  . Smokeless tobacco: Never Used  Substance and Sexual Activity  . Alcohol use: Yes    Alcohol/week: 10.0 standard drinks    Types: 10 Glasses of wine per week    Comment: occasionally  . Drug use: No  . Sexual activity: Yes    Birth control/protection: None  Lifestyle  . Physical activity    Days per week: Not on file    Minutes per session: Not on file   . Stress: Not on file  Relationships  . Social Herbalist on phone: Not on file    Gets together: Not on file    Attends religious service: Not on file    Active member of club or organization: Not on file    Attends meetings of clubs or organizations: Not on file    Relationship status: Not on file  . Intimate partner violence    Fear of current or ex partner: Not on file    Emotionally abused: Not on file    Physically abused: Not on file    Forced sexual activity: Not on file  Other Topics Concern  . Not on file  Social History Narrative   Currently unemployed --- looking for a job    Health Maintenance  Topic Date Due  . PAP SMEAR-Modifier  07/15/2021  . TETANUS/TDAP  05/04/2027  . INFLUENZA VACCINE  Completed  . HIV Screening  Completed    The following portions of the patient's history were reviewed and updated as appropriate: She  has a past medical history of Abnormal Pap smear, atypical squamous cells of undetermined sign (ASC-US) (10/98), Atypical nevus (07/29/2010), Atypical nevus x 2 (11/15/2006),  Atypical nevus x 3 (11/04/2007), Atypical nevus x 7 (10/15/2006), Condyloma acuminata (2001), Endometriosis (8/95), Hiatal hernia, History of campylobacteriosis, HSV-2 infection, IBS (irritable bowel syndrome), Internal thrombosed hemorrhoids, Migraine, and SCC (squamous cell carcinoma) Keratoacanthoma (04/22/2017). She does not have any pertinent problems on file. She  has a past surgical history that includes Breast enhancement surgery (02/2000); Exploratory laparotomy (8/95); Breast mass excision (Right, 12/01); Augmentation mammaplasty (Bilateral); and Breast biopsy (Left). Her family history includes Coronary artery disease in her maternal grandmother; Diabetes in her maternal grandfather, maternal grandmother, and maternal uncle; Heart failure in her maternal grandmother; Hernia in her mother; Irritable bowel syndrome in her mother; Leukemia in her maternal  grandfather; Prostate cancer in her maternal grandfather; Stroke in her maternal grandmother; Ulcers in her maternal grandfather. She  reports that she has never smoked. She has never used smokeless tobacco. She reports current alcohol use of about 10.0 standard drinks of alcohol per week. She reports that she does not use drugs. She has a current medication list which includes the following prescription(s): famotidine, meclizine, ondansetron, and valacyclovir. Current Outpatient Medications on File Prior to Visit  Medication Sig Dispense Refill  . meclizine (ANTIVERT) 25 MG tablet     . valACYclovir (VALTREX) 1000 MG tablet TAKE 1 TABLET BY MOUTH DAILY AND 2 TABLETS PER DAY FOR FLARE UPS FOR 3 DAYS (Patient not taking: Reported on 06/02/2019) 90 tablet 0   No current facility-administered medications on file prior to visit.    She is allergic to aspirin; doxycycline; latex; and sulfonamide derivatives..  Review of Systems Review of Systems  Constitutional: Negative for activity change, appetite change and fatigue.  HENT: Negative for hearing loss, congestion, tinnitus and ear discharge.  dentist q64m Eyes: Negative for visual disturbance (see optho q1y -- vision corrected to 20/20 with glasses).  Respiratory: Negative for cough, chest tightness and shortness of breath.   Cardiovascular: Negative for chest pain, palpitations and leg swelling.  Gastrointestinal: Negative for abdominal pain, diarrhea, constipation and abdominal distention.  Genitourinary: Negative for urgency, frequency, decreased urine volume and difficulty urinating.  Musculoskeletal: Negative for back pain, arthralgias and gait problem.  Skin: Negative for color change, pallor and rash.  Neurological: Negative for dizziness, light-headedness, numbness and headaches.  Hematological: Negative for adenopathy. Does not bruise/bleed easily.  Psychiatric/Behavioral: Negative for suicidal ideas, confusion, sleep disturbance,  self-injury, dysphoric mood, decreased concentration and agitation.       Objective:    BP 100/68 (BP Location: Right Arm, Patient Position: Sitting, Cuff Size: Normal)   Pulse 91   Temp 97.8 F (36.6 C) (Temporal)   Resp 18   Ht 5\' 6"  (1.676 m)   Wt 176 lb 9.6 oz (80.1 kg)   SpO2 96%   BMI 28.50 kg/m  General appearance: alert, cooperative, appears stated age and no distress Head: Normocephalic, without obvious abnormality, atraumatic Eyes: conjunctivae/corneas clear. PERRL, EOM's intact. Fundi benign. Ears: normal TM's and external ear canals both ears Nose: Nares normal. Septum midline. Mucosa normal. No drainage or sinus tenderness. Throat: lips, mucosa, and tongue normal; teeth and gums normal Neck: no adenopathy, no carotid bruit, no JVD, supple, symmetrical, trachea midline and thyroid not enlarged, symmetric, no tenderness/mass/nodules Back: symmetric, no curvature. ROM normal. No CVA tenderness. Lungs: clear to auscultation bilaterally Breasts: gyn Heart: regular rate and rhythm, S1, S2 normal, no murmur, click, rub or gallop Abdomen: soft, non-tender; bowel sounds normal; no masses,  no organomegaly Pelvic: deferred--gyn Extremities: extremities normal, atraumatic, no cyanosis or edema Pulses:  2+ and symmetric Skin: Skin color, texture, turgor normal. No rashes or lesions Lymph nodes: Cervical, supraclavicular, and axillary nodes normal. Neurologic: Alert and oriented X 3, normal strength and tone. Normal symmetric reflexes. Normal coordination and gait    Assessment:    Healthy female exam.      Plan:    ghm utd Check labs  See After Visit Summary for Counseling Recommendations    1. Preventative health care ghm utd Check labs  - CBC with Differential/Platelet - Lipid panel - Comprehensive metabolic panel - Thyroid Panel With TSH  2. Weight gain D/w pt diet and exercise She walks daily  - Thyroid Panel With TSH  3. Nausea pepcid rx Consider GI  if no improvement  - ondansetron (ZOFRAN) 4 MG tablet; Take 1 tablet (4 mg total) by mouth every 8 (eight) hours as needed for nausea or vomiting.  Dispense: 30 tablet; Refill: 0 - famotidine (PEPCID) 20 MG tablet; Take 1 tablet (20 mg total) by mouth 2 (two) times daily.  Dispense: 60 tablet; Refill: 2

## 2019-06-03 LAB — THYROID PANEL WITH TSH
Free Thyroxine Index: 1.5 (ref 1.4–3.8)
T3 Uptake: 33 % (ref 22–35)
T4, Total: 4.5 ug/dL — ABNORMAL LOW (ref 5.1–11.9)
TSH: 2.58 mIU/L

## 2019-06-10 ENCOUNTER — Other Ambulatory Visit: Payer: Self-pay | Admitting: Obstetrics & Gynecology

## 2019-06-12 NOTE — Telephone Encounter (Signed)
Medication refill request: Valtrex  Last AEX:  07/15/18 Next AEX: 07/18/19 Last MMG (if hormonal medication request): Na Refill authorized: 90 with 0 RF to get her to her AEX

## 2019-07-18 ENCOUNTER — Ambulatory Visit: Payer: BC Managed Care – PPO | Admitting: Obstetrics & Gynecology

## 2019-08-14 ENCOUNTER — Other Ambulatory Visit: Payer: Self-pay | Admitting: Obstetrics & Gynecology

## 2019-08-16 ENCOUNTER — Telehealth: Payer: Self-pay | Admitting: *Deleted

## 2019-08-16 DIAGNOSIS — R635 Abnormal weight gain: Secondary | ICD-10-CM

## 2019-08-16 NOTE — Telephone Encounter (Signed)
Patient calling to speak with nurse about a prescription that was denied.

## 2019-08-16 NOTE — Telephone Encounter (Signed)
Spoke to pt. Pt got a call from pharmacy about refusal of Rx phentermine and wanted to know what she needs to do to get Rx refilled. Pt states doesn't have insurance at this time, but has been paying out of pocket for Rx since [place] didn't  cover anyway and is in transition for new insurance which she hopes to have by AEX.  Pt has scheduled AEX on 10/02/2019. Declines OV at this time because of no insurance.   Med refill request: Phentermine 37.5mg  Last AEX: 07/15/2018 Next AEX:09/20/2019 Last MMG (if hormonal med) 08/29/2018, BIRADS 1, Negative Refill authorized: #30, 1 RF, orders pended if approved.

## 2019-08-28 ENCOUNTER — Other Ambulatory Visit: Payer: Self-pay

## 2019-08-29 ENCOUNTER — Ambulatory Visit (INDEPENDENT_AMBULATORY_CARE_PROVIDER_SITE_OTHER): Payer: Self-pay | Admitting: Obstetrics & Gynecology

## 2019-08-29 ENCOUNTER — Encounter: Payer: Self-pay | Admitting: Obstetrics & Gynecology

## 2019-08-29 VITALS — BP 118/70 | HR 100 | Temp 97.2°F | Ht 67.0 in | Wt 183.0 lb

## 2019-08-29 DIAGNOSIS — N898 Other specified noninflammatory disorders of vagina: Secondary | ICD-10-CM

## 2019-08-29 MED ORDER — FLUCONAZOLE 150 MG PO TABS: ORAL_TABLET | ORAL | 0 refills | Status: DC

## 2019-08-29 MED ORDER — PHENTERMINE HCL 37.5 MG PO CAPS: 37.5000 mg | ORAL_CAPSULE | ORAL | 1 refills | Status: DC

## 2019-08-29 NOTE — Progress Notes (Signed)
GYNECOLOGY  VISIT  CC:   Patient here with complains of having "cottage cheese" discharge and vaginal itching  HPI: 49 y.o. G0P0000 Married White or Caucasian female here for yeast infection.  Reports she's having cottage cheese discharge.  This started about a week ago.  Not sure if intercourse caused this, a hot tub or washing underwear with oxyclean.  There is itching.  She use OTC 3 day treatment.  It was an ovule treatment and she finished it last week.    Had blood work done in September.    Still having bleeding.  This occurs every two to three weeks.  Had endometrial biopsy and ultrasound last year.  Does not want to proceed with any treatment at this time.  Frustrated with weight.  Would like to restart phentermine.  Admits she's taken some that a friend had and has tolerated the 37.5mg  dosage.  Would like to try this.  Side effect, risks reviewed.  She will start checking BPs at home.  If has any elevation, she know she needs to call.    GYNECOLOGIC HISTORY: Patient's last menstrual period was 08/14/2019 (within days). Contraception: none Menopausal hormone therapy: none  Patient Active Problem List   Diagnosis Date Noted  . Preventative health care 06/02/2019  . Weight gain 06/02/2019  . Nausea 06/02/2019  . Adenomyosis 05/19/2019  . INFECTIOUS COLITIS ENTERITIS AND GASTROENTERITIS 05/14/2009  . CONSTIPATION 05/14/2009  . IRRITABLE BOWEL SYNDROME 05/14/2009  . INTERNAL THROMBOSED HEMORRHOIDS 12/31/2007    Past Medical History:  Diagnosis Date  . Abnormal Pap smear, atypical squamous cells of undetermined sign (ASC-US) 10/98   CIN 1  . Atypical nevus 07/29/2010   Right Medial Buttock-Moderate  . Atypical nevus x 2 11/15/2006   Upper Right Back-Moderate and Lower Mid Back-Moderate to Marked (w/s)  . Atypical nevus x 3 11/04/2007   Right Side-Slight to Moderate, Right Upper Side-Moderate, and Left Upper Breast-Moderate  . Atypical nevus x 7 10/15/2006   Right Medial  Hip-Moderate, Right Middle Hip-Slight, Right Lateral Hip-Mild, Right Axillary Vault-Moderate(w/s), Right Post Calf-Moderate(w/s),Right Inner Buttock-Slight, and Left Mid Back-Moderate(w/s)  . Condyloma acuminata 2001  . Endometriosis 8/95   DepoLupron 4/96-10/96  . Hiatal hernia   . History of campylobacteriosis   . HSV-2 infection   . IBS (irritable bowel syndrome)   . Internal thrombosed hemorrhoids   . Migraine    without aura  . 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 AUGMENTATION MAMMAPLASTY Bilateral   . BREAST BIOPSY Left    benign  . BREAST ENHANCEMENT SURGERY  02/2000  . BREAST MASS EXCISION Right 12/01   fibroadenoma  . EXPLORATORY LAPAROTOMY  8/95   endometriosis    MEDS:   Current Outpatient Medications on File Prior to Visit  Medication Sig Dispense Refill  . ondansetron (ZOFRAN) 4 MG tablet Take 1 tablet (4 mg total) by mouth every 8 (eight) hours as needed for nausea or vomiting. 30 tablet 0  . valACYclovir (VALTREX) 1000 MG tablet TAKE 1 TABLET BY MOUTH DAILY AND 2 TABLETS PER DAY FOR FLARE UPS FOR 3 DAYS 90 tablet 0  . meclizine (ANTIVERT) 25 MG tablet      No current facility-administered medications on file prior to visit.    ALLERGIES: Aspirin, Doxycycline, Latex, and Sulfonamide derivatives  Family History  Problem Relation Age of Onset  . Irritable bowel syndrome Mother   .  Hernia Mother   . Ulcers Maternal Grandfather        gastrectomy  . Leukemia Maternal Grandfather   . Diabetes Maternal Grandfather   . Prostate cancer Maternal Grandfather   . Diabetes Maternal Uncle   . Diabetes Maternal Grandmother   . Coronary artery disease Maternal Grandmother   . Stroke Maternal Grandmother   . Heart failure Maternal Grandmother   . Colon cancer Neg Hx     SH:  Married, non smoker  Review of Systems  Genitourinary: Positive for vaginal  discharge.       Vaginal itching  All other systems reviewed and are negative.   PHYSICAL EXAMINATION:    BP 118/70 (BP Location: Right Arm, Patient Position: Sitting, Cuff Size: Normal)   Pulse 100   Temp (!) 97.2 F (36.2 C) (Temporal)   Ht 5\' 7"  (1.702 m)   Wt 183 lb (83 kg)   LMP 08/14/2019 (Within Days)   BMI 28.66 kg/m     General appearance: alert, cooperative and appears stated age Abdomen: soft, non-tender; bowel sounds normal; no masses,  no organomegaly Lymph:  no inguinal LAD noted  Pelvic: External genitalia:  no lesions              Urethra:  normal appearing urethra with no masses, tenderness or lesions              Bartholins and Skenes: normal                 Vagina: normal appearing vagina with normal color, whitish discharge noted, no lesions              Cervix: no lesions              Bimanual Exam:  Uterus:  normal size, contour, position, consistency, mobility, non-tender              Adnexa: no mass, fullness, tenderness  Chaperone, Terence Lux, CMA, was present for exam.  Assessment: Vaginal discharge Frustrations with weight gain  Plan: Affirm pending Restart phentermine 37.5mg  daily.  #30/1RF.  Recheck 2 months. Diflucan 150mg  po x 1, repeat 72 hours.

## 2019-08-31 LAB — VAGINITIS/VAGINOSIS, DNA PROBE
Candida Species: NEGATIVE
Gardnerella vaginalis: NEGATIVE
Trichomonas vaginosis: NEGATIVE

## 2019-08-31 NOTE — Telephone Encounter (Signed)
Dr Sabra Heck has seen pt in office. Rx sent on 08/29/2019. Encounter closed.

## 2019-08-31 NOTE — Telephone Encounter (Signed)
Pt seen in office this week and medication concern addressed.  Thanks.

## 2019-09-04 ENCOUNTER — Other Ambulatory Visit: Payer: Self-pay | Admitting: Obstetrics & Gynecology

## 2019-09-05 NOTE — Telephone Encounter (Signed)
Rx should be declined as it was given last week and vaginitis testing was negative.  Thanks.

## 2019-09-05 NOTE — Telephone Encounter (Signed)
Medication refill request: diflucan 150mg  Last OV: 08-29-2019 Last MMG (if hormonal medication request): n/a Refill authorized: rx was sent take 1 tab po x 1, repeat in 72 hrs. #1 tablet with 0 refills at visit 08-29-2019. Please approve 1 tablet if appropriate.

## 2019-10-02 ENCOUNTER — Ambulatory Visit: Payer: Self-pay | Admitting: Obstetrics & Gynecology

## 2019-10-05 ENCOUNTER — Other Ambulatory Visit: Payer: Self-pay | Admitting: Obstetrics & Gynecology

## 2019-10-05 DIAGNOSIS — B009 Herpesviral infection, unspecified: Secondary | ICD-10-CM

## 2019-10-05 NOTE — Telephone Encounter (Signed)
Med refill request: Valtrex Last OV: 08/29/2019 for vaginitis Next AEX: not scheduled Last MMG (if hormonal med) n/a Refill authorized: # 90, 3 RF, pended if approved.  Pt has Hx of HSV 2.

## 2019-10-30 ENCOUNTER — Encounter: Payer: Self-pay | Admitting: Obstetrics & Gynecology

## 2019-10-30 ENCOUNTER — Telehealth: Payer: Self-pay | Admitting: *Deleted

## 2019-10-30 NOTE — Telephone Encounter (Signed)
Spoke to pt. Pt calling to have 2 month recheck since starting 37.5 mg of phentermine. Pt scheduled for Mychart video visit on 11/03/2019 at 4:30 pm. Pt agreeable and verbalized understanding.   Routing to Dr Sabra Heck for review and will close encounter.

## 2019-10-30 NOTE — Telephone Encounter (Signed)
Pt sent mychart message re: labs and AEX.   Routing to Dr Sabra Heck for review and recommendations.

## 2019-10-30 NOTE — Telephone Encounter (Signed)
Patient need to come in for phentermine medication follow up.

## 2019-10-30 NOTE — Telephone Encounter (Signed)
[facility]  Phone Number: [phone]  Just wanted to get this on our radar for my next appointment. I am still waiting to have insurance before scheduling my annual. But, let's please repeat bmp. My GFR and Potassium were high. Thyroid- t4- slightly low. MCV looked high. These tests were done in Oct. 2020. Thanks, Dr. Sabra Heck.

## 2019-11-01 NOTE — Telephone Encounter (Signed)
Ok to have BMP repeated but really only needs potassium level repeated.  Ok to just order potassium alone.  Elevated GFR is good.  This is an estimation of kidney function.  Low is bad.  High is good.  Her total T4 was a little low.  If/when she decides to have thyroid testing done again, I would suggest a free T4 and not total.  This is better test for actual thyroid function.  So, ok to place order for potassium.

## 2019-11-02 NOTE — Telephone Encounter (Signed)
Spoke to pt. Pt given recommendations for BMP vs K+ level redraw. Pt states will discuss more at [place] visit scheduled for 11/03/19 with Dr Sabra Heck. Pt doesn't have insurance at this time.   Routing to Dr Sabra Heck for review and will close encounter.

## 2019-11-03 ENCOUNTER — Encounter: Payer: Self-pay | Admitting: Obstetrics & Gynecology

## 2019-11-03 ENCOUNTER — Telehealth (INDEPENDENT_AMBULATORY_CARE_PROVIDER_SITE_OTHER): Payer: Self-pay | Admitting: Obstetrics & Gynecology

## 2019-11-03 ENCOUNTER — Other Ambulatory Visit: Payer: Self-pay

## 2019-11-03 DIAGNOSIS — Z6828 Body mass index (BMI) 28.0-28.9, adult: Secondary | ICD-10-CM

## 2019-11-03 MED ORDER — PHENTERMINE HCL 37.5 MG PO CAPS: 37.5000 mg | ORAL_CAPSULE | ORAL | 1 refills | Status: DC

## 2019-11-03 NOTE — Progress Notes (Signed)
Virtual Visit via Video Note  I connected with [name] on 11/03/19 at  4:30 PM EDT by a video enabled telemedicine application and verified that I am speaking with the correct person using two identifiers.  Location: Patient: home Provider: work   I discussed the limitations of evaluation and management by telemedicine and the availability of in person appointments. The patient expressed understanding and agreed to proceed.  History of Present Illness: 49 yo G0 MWF here to discuss phentermine use.  She started.   Takes about 10am in the morning.  She is having some new issues with headache.  Denies insomnia.  Denies jitters.  She is exercising about 5-7 days.  She is doing yoga and walking most days.  She walks at least an hour.    She has been looking for work still.  She has done some interviews for a new job.  Feels anxiety is increased some due to using some of her savings.     Observations/Objective: Weight at home:  177#  Starting weight: #183  weight change:  6# BP:  Pt reports she takes at a friend's house and it has been <120/80  Assessment and Plan: 49 yo with BMI 28.6 who desires to continue working on weight loss  Follow Up Instructions: Continue phentermine 37.5mg  daily.  Recheck 2 months. Pt will plan to return for potassium recheck   I discussed the assessment and treatment plan with the patient. The patient was provided an opportunity to ask questions and all were answered. The patient agreed with the plan and demonstrated an understanding of the instructions.   The patient was advised to call back or seek an in-person evaluation if the symptoms worsen or if the condition fails to improve as anticipated.  I provided ~20 minutes of non-face-to-face time during this encounter.   Megan Salon, MD

## 2020-03-04 NOTE — Progress Notes (Signed)
49 y.o. G0P0000 Married White or Caucasian female here for annual exam.  Heading to the beach after visit today.  Having irregular cycles.  Flow lasts 6 days.  Flow is typically heavy at first and typically lasts 3-4 weeks.  Has chronic constipation.  Has not been on Linzess.    Patient's last menstrual period was 02/26/2020 (approximate).          Sexually active: No.  The current method of family planning is none.    Exercising: Yes.    walking, running, weights Smoker:  no  Health  Maintenance: Pap: 04-03-16 neg HPV HR neg, 05-03-17 neg, 07-15-18 neg History of abnormal Pap:  yes MMG:  08-29-2018 category c density birads 1:neg Colonoscopy:  05-30-09 normal f/u 10 yrs BMD:   Heel test TDaP:  2018 Pneumonia vaccine(s):  never Shingrix:   Discussed with pt today.  Declines today.  Hep C testing: n/a Screening Labs: discuss today   reports that she has never smoked. She has never used smokeless tobacco. She reports current alcohol use of about 10.0 standard drinks of alcohol per week. She reports that she does not use drugs.  Past Medical History:  Diagnosis Date  . Abnormal Pap smear, atypical squamous cells of undetermined sign (ASC-US) 10/98   CIN 1  . Atypical nevus 07/29/2010   Right Medial Buttock-Moderate  . Atypical nevus x 2 11/15/2006   Upper Right Back-Moderate and Lower Mid Back-Moderate to Marked (w/s)  . Atypical nevus x 3 11/04/2007   Right Side-Slight to Moderate, Right Upper Side-Moderate, and Left Upper Breast-Moderate  . Atypical nevus x 7 10/15/2006   Right Medial Hip-Moderate, Right Middle Hip-Slight, Right Lateral Hip-Mild, Right Axillary Vault-Moderate(w/s), Right Post Calf-Moderate(w/s),Right Inner Buttock-Slight, and Left Mid Back-Moderate(w/s)  . Condyloma acuminata 2001  . Endometriosis 8/95   DepoLupron 4/96-10/96  . Hiatal hernia   . History of campylobacteriosis   . HSV-2 infection   . IBS (irritable bowel syndrome)   . Internal thrombosed  hemorrhoids   . Migraine    without aura  . 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 AUGMENTATION MAMMAPLASTY Bilateral   . BREAST BIOPSY Left    benign  . BREAST ENHANCEMENT SURGERY  02/2000  . BREAST MASS EXCISION Right 12/01   fibroadenoma  . EXPLORATORY LAPAROTOMY  8/95   endometriosis    Current Outpatient Medications  Medication Sig Dispense Refill  . albuterol (VENTOLIN HFA) 108 (90 Base) MCG/ACT inhaler SMARTSIG:1-2 Puff(s) Via Inhaler Every 4-6 Hours PRN    . benzonatate (TESSALON) 100 MG capsule Take 100 mg by mouth 3 (three) times daily as needed.    Marland Kitchen GABAPENTIN PO Take by mouth as needed.    . valACYclovir (VALTREX) 1000 MG tablet 1 tab po daily.  Increased to bid x 3 days with symptoms. 90 tablet 3  . phentermine 37.5 MG capsule Take 1 capsule (37.5 mg total) by mouth every morning. (Patient not taking: Reported on 03/05/2020) 30 capsule 1   No current facility-administered medications for this visit.    Family History  Problem Relation Age of Onset  . Irritable bowel syndrome Mother   . Hernia Mother   . Ulcers Maternal Grandfather        gastrectomy  . Leukemia Maternal Grandfather   . Diabetes Maternal Grandfather   . Prostate cancer Maternal Grandfather   . Diabetes Maternal Grandmother   .  Coronary artery disease Maternal Grandmother   . Stroke Maternal Grandmother   . Heart failure Maternal Grandmother   . Colon cancer Neg Hx     Review of Systems  All other systems reviewed and are negative.   Exam:   BP 118/70 (BP Location: Right Arm, Patient Position: Sitting, Cuff Size: Normal)   Pulse 84   Resp 12   Ht 5' 7.5" (1.715 m)   Wt 174 lb (78.9 kg)   LMP 02/26/2020 (Approximate)   BMI 26.85 kg/m   Height: 5' 7.5" (171.5 cm)  General appearance: alert, cooperative and appears stated age Head: Normocephalic, without obvious abnormality,  atraumatic Neck: no adenopathy, supple, symmetrical, trachea midline and thyroid normal to inspection and palpation Lungs: clear to auscultation bilaterally Breasts: normal appearance, no masses or tenderness, bilateral implants present Heart: regular rate and rhythm Abdomen: soft, non-tender; bowel sounds normal; no masses,  no organomegaly Extremities: extremities normal, atraumatic, no cyanosis or edema Skin: Skin color, texture, turgor normal. No rashes or lesions Lymph nodes: Cervical, supraclavicular, and axillary nodes normal. No abnormal inguinal nodes palpated Neurologic: Grossly normal   Pelvic: External genitalia:  no lesions              Urethra:  normal appearing urethra with no masses, tenderness or lesions              Bartholins and Skenes: normal                 Vagina: normal appearing vagina with normal color and discharge, no lesions              Cervix: no lesions              Pap taken: Yes.   Bimanual Exam:  Uterus:  normal size, contour, position, consistency, mobility, non-tender              Adnexa: normal adnexa and no mass, fullness, tenderness               Rectovaginal: Confirms               Anus:  normal sphincter tone, no lesions  Chaperone, Terence Lux, CMA, was present for exam.  A:  Well Woman with normal exam H/o long term infertility with h/o endometriosis, not using any contraception H/O vulvar condyloma Remote hx of CIn 1, 2998 Frustrations with weight Irregular bleeding negative endometrial biopsy 12/19 and normal ultrasound 05/2019  P:   Mammogram is overdue.  Pt aware. States she will schedule. pap smear with HR HPV obtained today Lab work will be obtained today:  CBC, CMP, Lipids, TSh, hbA1C, FSH and estradiol levels Colonoscopy is due.  Recommended referral.  Pt will let me know when ready for referral Shingrix vaccination discussed with pt today Return annually or prn

## 2020-03-05 ENCOUNTER — Telehealth: Payer: Self-pay | Admitting: Obstetrics & Gynecology

## 2020-03-05 ENCOUNTER — Other Ambulatory Visit: Payer: Self-pay

## 2020-03-05 ENCOUNTER — Encounter: Payer: Self-pay | Admitting: Obstetrics & Gynecology

## 2020-03-05 ENCOUNTER — Other Ambulatory Visit (HOSPITAL_COMMUNITY)
Admission: RE | Admit: 2020-03-05 | Discharge: 2020-03-05 | Disposition: A | Discharge status: Home / Self Care | Payer: 59 | Source: Ambulatory Visit | Attending: Obstetrics & Gynecology | Admitting: Obstetrics & Gynecology

## 2020-03-05 ENCOUNTER — Ambulatory Visit: Payer: 59 | Admitting: Obstetrics & Gynecology

## 2020-03-05 VITALS — BP 118/70 | HR 84 | Resp 12 | Ht 67.5 in | Wt 174.0 lb

## 2020-03-05 DIAGNOSIS — Z01419 Encounter for gynecological examination (general) (routine) without abnormal findings: Secondary | ICD-10-CM | POA: Diagnosis not present

## 2020-03-05 DIAGNOSIS — Z0000 Encounter for general adult medical examination without abnormal findings: Secondary | ICD-10-CM | POA: Diagnosis not present

## 2020-03-05 DIAGNOSIS — Z124 Encounter for screening for malignant neoplasm of cervix: Secondary | ICD-10-CM

## 2020-03-05 NOTE — Telephone Encounter (Signed)
Patient is checking the status of her prescription request. She states she is going out of town early tomorrow morning.

## 2020-03-05 NOTE — Telephone Encounter (Signed)
Spoke with pt. Pt was seen today for AEX. Pt states forgot to ask about Phentermine Rx refill at appt. Pt states took meds last x 3 weeks ago.   Pt advised will review with Dr Sabra Heck and return call with confirmation of Rx. Pt agreeable. Pharmacy verified.   Routing to Dr Sabra Heck.

## 2020-03-05 NOTE — Telephone Encounter (Signed)
Patient wanted to ask Dr Sabra Heck to fill a prescription for phentermine.

## 2020-03-06 LAB — LIPID PANEL
Chol/HDL Ratio: 3.6 ratio (ref 0.0–4.4)
Cholesterol, Total: 284 mg/dL — ABNORMAL HIGH (ref 100–199)
HDL: 79 mg/dL (ref >39)
LDL Chol Calc (NIH): 191 mg/dL — ABNORMAL HIGH (ref 0–99)
Triglycerides: 86 mg/dL (ref 0–149)
VLDL Cholesterol Cal: 14 mg/dL (ref 5–40)

## 2020-03-06 LAB — COMPREHENSIVE METABOLIC PANEL
ALT: 22 IU/L (ref 0–32)
AST: 26 IU/L (ref 0–40)
Albumin/Globulin Ratio: 1.8 (ref 1.2–2.2)
Albumin: 4.5 g/dL (ref 3.8–4.8)
Alkaline Phosphatase: 66 IU/L (ref 48–121)
BUN/Creatinine Ratio: 18 (ref 9–23)
BUN: 12 mg/dL (ref 6–24)
Bilirubin Total: 0.7 mg/dL (ref 0.0–1.2)
CO2: 23 mmol/L (ref 20–29)
Calcium: 9.3 mg/dL (ref 8.7–10.2)
Chloride: 100 mmol/L (ref 96–106)
Creatinine, Ser: 0.67 mg/dL (ref 0.57–1.00)
GFR calc Af Amer: 119 mL/min/{1.73_m2} (ref >59)
GFR calc non Af Amer: 103 mL/min/{1.73_m2} (ref >59)
Globulin, Total: 2.5 g/dL (ref 1.5–4.5)
Glucose: 85 mg/dL (ref 65–99)
Potassium: 4.7 mmol/L (ref 3.5–5.2)
Sodium: 139 mmol/L (ref 134–144)
Total Protein: 7 g/dL (ref 6.0–8.5)

## 2020-03-06 LAB — FOLLICLE STIMULATING HORMONE: FSH: 28.5 m[IU]/mL

## 2020-03-06 LAB — CBC
Hematocrit: 43.1 % (ref 34.0–46.6)
Hemoglobin: 14.4 g/dL (ref 11.1–15.9)
MCH: 34.2 pg — ABNORMAL HIGH (ref 26.6–33.0)
MCHC: 33.4 g/dL (ref 31.5–35.7)
MCV: 102 fL — ABNORMAL HIGH (ref 79–97)
Platelets: 363 10*3/uL (ref 150–450)
RBC: 4.21 x10E6/uL (ref 3.77–5.28)
RDW: 11.8 % (ref 11.7–15.4)
WBC: 6.6 10*3/uL (ref 3.4–10.8)

## 2020-03-06 LAB — THYROID PANEL WITH TSH
Free Thyroxine Index: 1.3 (ref 1.2–4.9)
T3 Uptake Ratio: 27 % (ref 24–39)
T4, Total: 4.8 ug/dL (ref 4.5–12.0)
TSH: 3.74 u[IU]/mL (ref 0.450–4.500)

## 2020-03-06 LAB — CYTOLOGY - PAP
Comment: NEGATIVE
Diagnosis: NEGATIVE
High risk HPV: NEGATIVE

## 2020-03-06 LAB — ESTRADIOL: Estradiol: 69.6 pg/mL

## 2020-03-06 LAB — HEMOGLOBIN A1C
Est. average glucose Bld gHb Est-mCnc: 103 mg/dL
Hgb A1c MFr Bld: 5.2 % (ref 4.8–5.6)

## 2020-03-08 ENCOUNTER — Other Ambulatory Visit: Payer: Self-pay | Admitting: Obstetrics & Gynecology

## 2020-03-08 MED ORDER — PHENTERMINE HCL 37.5 MG PO CAPS: 37.5000 mg | ORAL_CAPSULE | ORAL | 0 refills | Status: DC

## 2020-03-08 NOTE — Telephone Encounter (Signed)
Rx for 37.5mg  sent to pharmacy.  Newest recommendations is monthly follow up for 3 months.  If has not lost >5% body weight in 12 weeks/3 months, will need to stop.  Please schedule 1 month follow up.

## 2020-03-11 ENCOUNTER — Ambulatory Visit: Payer: BC Managed Care – PPO | Admitting: Physician Assistant

## 2020-03-14 ENCOUNTER — Encounter: Payer: Self-pay | Admitting: Family Medicine

## 2020-03-14 NOTE — Telephone Encounter (Signed)
She would need ov--- its an injection  We would need to show her how to do it and she should check with her ins to make sure its covered Either saxenda or wegovy is the new one

## 2020-03-19 ENCOUNTER — Encounter: Payer: Self-pay | Admitting: Family Medicine

## 2020-03-19 ENCOUNTER — Other Ambulatory Visit: Payer: Self-pay

## 2020-03-19 ENCOUNTER — Encounter: Payer: Self-pay | Admitting: Gastroenterology

## 2020-03-19 ENCOUNTER — Ambulatory Visit: Payer: 59 | Admitting: Family Medicine

## 2020-03-19 VITALS — BP 110/80 | HR 96 | Temp 99.1°F | Resp 18 | Ht 67.5 in | Wt 177.2 lb

## 2020-03-19 DIAGNOSIS — J4520 Mild intermittent asthma, uncomplicated: Secondary | ICD-10-CM | POA: Diagnosis not present

## 2020-03-19 DIAGNOSIS — K5901 Slow transit constipation: Secondary | ICD-10-CM

## 2020-03-19 DIAGNOSIS — E663 Overweight: Secondary | ICD-10-CM

## 2020-03-19 MED ORDER — LINACLOTIDE 72 MCG PO CAPS: 72.0000 ug | ORAL_CAPSULE | Freq: Every day | ORAL | 2 refills | Status: DC

## 2020-03-19 MED ORDER — WEGOVY 0.25 MG/0.5ML ~~LOC~~ SOAJ: 0.2500 mg | SUBCUTANEOUS | 0 refills | Status: DC

## 2020-03-19 MED ORDER — QVAR REDIHALER 40 MCG/ACT IN AERB: 2.0000 | INHALATION_SPRAY | Freq: Two times a day (BID) | RESPIRATORY_TRACT | 5 refills | Status: DC

## 2020-03-19 NOTE — Progress Notes (Signed)
Patient ID: [name] [name], female    DOB: Aug 30, 1970  Age: 49 y.o. MRN: [id number]    Subjective:  Subjective  HPI [name] [name] presents to discuss weight loss medication .   She is working out again and trying to eat healthy She also c/o her asthma acting up with the humidity -- she is using her inhaler more often Pt also has a hx of constipation and used to see Dr Olevia Perches-- she needs a referral back to GI but would like to try linzess while she is waiting   Review of Systems  Constitutional: Negative for appetite change, diaphoresis, fatigue and unexpected weight change.  Eyes: Negative for pain, redness and visual disturbance.  Respiratory: Negative for cough, chest tightness, shortness of breath and wheezing.   Cardiovascular: Negative for chest pain, palpitations and leg swelling.  Endocrine: Negative for cold intolerance, heat intolerance, polydipsia, polyphagia and polyuria.  Genitourinary: Negative for difficulty urinating, dysuria and frequency.  Neurological: Negative for dizziness, light-headedness, numbness and headaches.    History Past Medical History:  Diagnosis Date  . Abnormal Pap smear, atypical squamous cells of undetermined sign (ASC-US) 10/98   CIN 1  . Atypical nevus 07/29/2010   Right Medial Buttock-Moderate  . Atypical nevus x 2 11/15/2006   Upper Right Back-Moderate and Lower Mid Back-Moderate to Marked (w/s)  . Atypical nevus x 3 11/04/2007   Right Side-Slight to Moderate, Right Upper Side-Moderate, and Left Upper Breast-Moderate  . Atypical nevus x 7 10/15/2006   Right Medial Hip-Moderate, Right Middle Hip-Slight, Right Lateral Hip-Mild, Right Axillary Vault-Moderate(w/s), Right Post Calf-Moderate(w/s),Right Inner Buttock-Slight, and Left Mid Back-Moderate(w/s)  . Condyloma acuminata 2001  . Endometriosis 8/95   DepoLupron 4/96-10/96  . Hiatal hernia   . History of campylobacteriosis   . HSV-2 infection   . IBS (irritable bowel syndrome)   .  Internal thrombosed hemorrhoids   . Migraine    without aura  . 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   Her family history includes Coronary artery disease in her maternal grandmother; Diabetes in her maternal grandfather and maternal grandmother; Heart failure in her maternal grandmother; Hernia in her mother; Irritable bowel syndrome in her mother; Leukemia in her maternal grandfather; Prostate cancer in her maternal grandfather; Stroke in her maternal grandmother; Ulcers in her maternal grandfather.She reports that she has never smoked. She has never used smokeless tobacco. She reports current alcohol use of about 10.0 standard drinks of alcohol per week. She reports that she does not use drugs.  Current Outpatient Medications on File Prior to Visit  Medication Sig Dispense Refill  . albuterol (VENTOLIN HFA) 108 (90 Base) MCG/ACT inhaler SMARTSIG:1-2 Puff(s) Via Inhaler Every 4-6 Hours PRN    . GABAPENTIN PO Take by mouth as needed.    . valACYclovir (VALTREX) 1000 MG tablet 1 tab po daily.  Increased to bid x 3 days with symptoms. 90 tablet 3   No current facility-administered medications on file prior to visit.     Objective:  Objective  Physical Exam Vitals and nursing note reviewed.  Constitutional:      Appearance: She is well-developed.  HENT:     Head: Normocephalic and atraumatic.  Eyes:     Conjunctiva/sclera: Conjunctivae normal.  Neck:     Thyroid:  No thyromegaly.     Vascular: No carotid bruit or JVD.  Cardiovascular:     Rate and Rhythm: Normal rate and regular rhythm.     Heart sounds: Normal heart sounds. No murmur heard.   Pulmonary:     Effort: Pulmonary effort is normal. No respiratory distress.      Breath sounds: Normal breath sounds. No wheezing or rales.  Chest:     Chest wall: No tenderness.  Musculoskeletal:     Cervical back: Normal range of motion and neck supple.  Neurological:     Mental Status: She is alert and oriented to person, place, and time.    BP 110/80 (BP Location: Right Arm, Patient Position: Sitting, Cuff Size: Normal)   Pulse 96   Temp 99.1 F (37.3 C) (Oral)   Resp 18   Ht 5' 7.5" (1.715 m)   Wt 177 lb 3.2 oz (80.4 kg)   LMP 02/26/2020 (Approximate)   SpO2 99%   BMI 27.34 kg/m  Wt Readings from Last 3 Encounters:  03/19/20 177 lb 3.2 oz (80.4 kg)  03/05/20 174 lb (78.9 kg)  08/29/19 183 lb (83 kg)     Lab Results  Component Value Date   WBC 6.6 03/05/2020   HGB 14.4 03/05/2020   HCT 43.1 03/05/2020   PLT 363 03/05/2020   GLUCOSE 85 03/05/2020   CHOL 284 (H) 03/05/2020   TRIG 86 03/05/2020   HDL 79 03/05/2020   LDLCALC 191 (H) 03/05/2020   ALT 22 03/05/2020   AST 26 03/05/2020   NA 139 03/05/2020   K 4.7 03/05/2020   CL 100 03/05/2020   CREATININE 0.67 03/05/2020   BUN 12 03/05/2020   CO2 23 03/05/2020   TSH 3.740 03/05/2020   HGBA1C 5.2 03/05/2020    No results found.   Assessment & Plan:  Plan  I have discontinued [name] [name]'s benzonatate and phentermine. I am also having her start on Wegovy, Qvar RediHaler, and linaclotide. Additionally, I am having her maintain her valACYclovir, albuterol, and GABAPENTIN PO.  Meds ordered this encounter  Medications  . Semaglutide-Weight Management (WEGOVY) 0.25 MG/0.5ML SOAJ    Sig: Inject 0.25 mg into the skin once a week.    Dispense:  2 mL    Refill:  0  . beclomethasone (QVAR REDIHALER) 40 MCG/ACT inhaler    Sig: Inhale 2 puffs into the lungs 2 (two) times daily.    Dispense:  10.6 g    Refill:  5  . linaclotide (LINZESS) 72 MCG capsule    Sig: Take 1 capsule (72 mcg total) by mouth daily before breakfast.    Dispense:  30 capsule    Refill:  2    Problem List Items  Addressed This Visit    None    Visit Diagnoses    Overweight (BMI 25.0-29.9)    -  Primary   Relevant Medications   Semaglutide-Weight Management (WEGOVY) 0.25 MG/0.5ML SOAJ   Mild intermittent asthma, unspecified whether complicated       Relevant Medications   beclomethasone (QVAR REDIHALER) 40 MCG/ACT inhaler   Slow transit constipation       Relevant Medications   linaclotide (LINZESS) 72 MCG capsule   Other Relevant Orders   Ambulatory referral to Gastroenterology      Follow-up: Return in about 3 months (around 06/19/2020), or if symptoms worsen or fail to improve.  Ann Held, DO

## 2020-03-19 NOTE — Assessment & Plan Note (Signed)
linzess--- refer to GI  pt due for colon this year

## 2020-03-19 NOTE — Assessment & Plan Note (Signed)
qvar 2 puffs bid Albuterol prn rto 3 months or sooner prn

## 2020-03-19 NOTE — Patient Instructions (Signed)

## 2020-03-19 NOTE — Assessment & Plan Note (Signed)
con't diet and exercise Pt instructed on the use of wegovy --- will inc dose in 1 month

## 2020-03-21 NOTE — Telephone Encounter (Signed)
Left message for pt to return call to triage RN. 

## 2020-03-21 NOTE — Telephone Encounter (Signed)
Spoke with pt. Pt states never having [facility] call about Phentermine Rx from 03/08/20. Pt states seeing Dr Etter Sjogren and was prescribed something different on 03/19/20 [facility]). Pt states still waiting for that Rx. Pt states if decides not to try [facility], will return call to our office for new Rx of Phentermine. Pt does not need to make 1 month appt at this time.  Encounter closed.  Routing to Dr Sabra Heck for update.

## 2020-03-21 NOTE — Telephone Encounter (Signed)
I reached to patient to schedule her 1 month follow per Dr.Miller. She does not understand why she needs to return?

## 2020-03-29 ENCOUNTER — Telehealth: Payer: Self-pay | Admitting: Family Medicine

## 2020-03-29 DIAGNOSIS — E663 Overweight: Secondary | ICD-10-CM

## 2020-03-29 NOTE — Telephone Encounter (Signed)
Patient states the this medication is not at her current pharmacy. Please Advise    Semaglutide-Weight Management (WEGOVY) 0.25 MG/0.5ML Darden Palmer [[id number]]

## 2020-03-29 NOTE — Telephone Encounter (Signed)
Medication: beclomethasone (QVAR REDIHALER) 40 MCG/ACT inhaler [[id number]]    linaclotide (LINZESS) 72 MCG capsule [[id number]]    Has the patient contacted their pharmacy? No. (If no, request that the patient contact the pharmacy for the refill.) (If yes, when and what did the pharmacy advise?)  Preferred Pharmacy (with phone number or street name): [id number] [address]  [place], [place] [address]  Phone:  [phone] Fax:  [phone]  DEA #:  [id number]  Agent: Please be advised that RX refills may take up to 3 business days. We ask that you follow-up with your pharmacy.

## 2020-04-01 ENCOUNTER — Telehealth: Payer: Self-pay | Admitting: Family Medicine

## 2020-04-01 ENCOUNTER — Other Ambulatory Visit: Payer: Self-pay

## 2020-04-01 DIAGNOSIS — E663 Overweight: Secondary | ICD-10-CM

## 2020-04-01 MED ORDER — WEGOVY 0.25 MG/0.5ML ~~LOC~~ SOAJ: 0.2500 mg | SUBCUTANEOUS | 0 refills | Status: DC

## 2020-04-01 NOTE — Telephone Encounter (Signed)
Pt has refills at the pharmacy

## 2020-04-01 NOTE — Telephone Encounter (Signed)
Medication resent

## 2020-04-01 NOTE — Telephone Encounter (Signed)
Pt states her pharmacy told her she needs the beclomethasone inhaler changed to something else her insurance will pay for and the linzess needs a prior authorization.  Pt picked up prescription for [facility]. She said her pharmacy didn't have the medication so she will take it and have it filled at a different pharmacy.

## 2020-04-02 ENCOUNTER — Other Ambulatory Visit: Payer: Self-pay | Admitting: Family Medicine

## 2020-04-02 MED ORDER — FLOVENT HFA 110 MCG/ACT IN AERO: 2.0000 | INHALATION_SPRAY | Freq: Two times a day (BID) | RESPIRATORY_TRACT | 12 refills | Status: DC

## 2020-04-02 NOTE — Telephone Encounter (Signed)
Please advise on inhaler.

## 2020-04-02 NOTE — Telephone Encounter (Signed)
PA initiated via Covermymeds:  [id number]. Awaiting determination.

## 2020-04-02 NOTE — Telephone Encounter (Signed)
PA approved. Request Reference Number: [id number]. LINZESS CAP 72MCG is approved through 04/02/2021. Your patient may now fill this prescription and it will be covered.

## 2020-04-02 NOTE — Telephone Encounter (Signed)
I sent in flovent

## 2020-04-18 ENCOUNTER — Telehealth: Payer: Self-pay | Admitting: Family Medicine

## 2020-04-18 ENCOUNTER — Other Ambulatory Visit: Payer: Self-pay | Admitting: Obstetrics & Gynecology

## 2020-04-18 DIAGNOSIS — Z1231 Encounter for screening mammogram for malignant neoplasm of breast: Secondary | ICD-10-CM

## 2020-04-18 NOTE — Telephone Encounter (Signed)
LVM for pt to call office to inform her about her coupon for [facility]. Copy coupon is at front desk and also pt can log on web site on [email or web address] to get some discounts.

## 2020-04-22 ENCOUNTER — Telehealth: Payer: Self-pay | Admitting: Family Medicine

## 2020-04-22 NOTE — Telephone Encounter (Signed)
Patient states the Foye Deer is very expensive and the pharmacy suggested she have it deemed medically necessary so her insurance will cover it. Please call patient and advise.

## 2020-04-22 NOTE — Telephone Encounter (Signed)
Please advise 

## 2020-04-22 NOTE — Telephone Encounter (Signed)
Im guessing that means prior auth--- all we can do is add mild asthma to diagnosis \ May need to call pharmacy to find out exactly what they mean--- her ins may just not pay for it

## 2020-04-24 NOTE — Telephone Encounter (Signed)
Yes--- I can not saw its is medically necessary if there are no comorbidity and low bmi

## 2020-04-24 NOTE — Telephone Encounter (Signed)
Patient in reference to status of medication for weight loss

## 2020-04-24 NOTE — Telephone Encounter (Signed)
Spoke with patient and advised about insurance.

## 2020-04-24 NOTE — Telephone Encounter (Signed)
Pt's BMI is lower than 30 and will not qualify for approval through insurance. Has to be higher than 30 unless she has comorbidity and she doesn't

## 2020-05-03 ENCOUNTER — Ambulatory Visit
Admission: RE | Admit: 2020-05-03 | Discharge: 2020-05-03 | Disposition: A | Discharge status: Home / Self Care | Payer: 59 | Source: Ambulatory Visit | Attending: Obstetrics & Gynecology | Admitting: Obstetrics & Gynecology

## 2020-05-03 ENCOUNTER — Other Ambulatory Visit: Payer: Self-pay

## 2020-05-03 DIAGNOSIS — Z1231 Encounter for screening mammogram for malignant neoplasm of breast: Secondary | ICD-10-CM

## 2020-05-13 ENCOUNTER — Encounter: Payer: Self-pay | Admitting: Family Medicine

## 2020-05-13 NOTE — Telephone Encounter (Signed)
I did not write the phentermine for her Dr Sabra Heck did -----

## 2020-05-14 ENCOUNTER — Other Ambulatory Visit: Payer: Self-pay

## 2020-05-14 ENCOUNTER — Encounter: Payer: Self-pay | Admitting: Obstetrics & Gynecology

## 2020-05-14 NOTE — Telephone Encounter (Signed)
Medication refill request: Phentermine  Last AEX:  03/05/20 SM Next AEX: none Last MMG (if hormonal medication request): n/a Refill authorized: Today, please advise;  Per Mychart message 05/13/20, patient was told to give our office a call to request a refill of Phentermine

## 2020-05-14 NOTE — Telephone Encounter (Signed)
Patient sent the following message via MyChart.  Hi Dr Etter Sjogren cancelled my Phentermine RX because she prescribed me Wegovy. No one could afford that, it's $1400 per month! I did not have it filled. Please send in a new order for Phentermine.  Thank you

## 2020-05-17 NOTE — Telephone Encounter (Signed)
Patient has been notified of message as written below by Dr. Sabra Heck. Patient verbalizes understanding and is agreeable. Closing encounter.

## 2020-05-17 NOTE — Telephone Encounter (Signed)
Please let pt know that I think she really needs to have one provider manage this for her.  I think Dr. Etter Sjogren is more comfortable prescribing some of the other medications that are available for weight loss, so please continue her care for these medications with Dr. Etter Sjogren, her PCP.  Pt transitioned her own care for this to Dr. Etter Sjogren in the summer.  I have declined this prescription.  I'm sorry if she is frustrated with me about this but I don't think it's a good idea to go back and forth between providers about weight loss medications.  Rx declined.

## 2020-05-23 ENCOUNTER — Encounter: Payer: Self-pay | Admitting: Gastroenterology

## 2020-05-23 ENCOUNTER — Ambulatory Visit: Payer: 59 | Admitting: Gastroenterology

## 2020-05-23 VITALS — BP 100/70 | HR 109 | Ht 67.5 in | Wt 172.0 lb

## 2020-05-23 DIAGNOSIS — Z1212 Encounter for screening for malignant neoplasm of rectum: Secondary | ICD-10-CM

## 2020-05-23 DIAGNOSIS — K5904 Chronic idiopathic constipation: Secondary | ICD-10-CM

## 2020-05-23 DIAGNOSIS — R110 Nausea: Secondary | ICD-10-CM

## 2020-05-23 DIAGNOSIS — K219 Gastro-esophageal reflux disease without esophagitis: Secondary | ICD-10-CM | POA: Diagnosis not present

## 2020-05-23 DIAGNOSIS — Z1211 Encounter for screening for malignant neoplasm of colon: Secondary | ICD-10-CM

## 2020-05-23 DIAGNOSIS — K581 Irritable bowel syndrome with constipation: Secondary | ICD-10-CM

## 2020-05-23 MED ORDER — SUPREP BOWEL PREP KIT 17.5-3.13-1.6 GM/177ML PO SOLN: 1.0000 | Freq: Once | ORAL | 0 refills | Status: AC

## 2020-05-23 MED ORDER — OMEPRAZOLE 20 MG PO CPDR: 20.0000 mg | DELAYED_RELEASE_CAPSULE | Freq: Every day | ORAL | 2 refills | Status: DC

## 2020-05-23 MED ORDER — ONDANSETRON HCL 4 MG PO TABS: 4.0000 mg | ORAL_TABLET | Freq: Two times a day (BID) | ORAL | 1 refills | Status: DC | PRN

## 2020-05-23 MED ORDER — DICYCLOMINE HCL 20 MG PO TABS: 20.0000 mg | ORAL_TABLET | Freq: Three times a day (TID) | ORAL | 2 refills | Status: DC | PRN

## 2020-05-23 NOTE — Patient Instructions (Addendum)
You have been scheduled for an endoscopy and colonoscopy. Please follow the written instructions given to you at your visit today. Please pick up your prep supplies at the pharmacy within the next 1-3 days. If you use inhalers (even only as needed), please bring them with you on the day of your procedure.  Continue Linzess 72 mcg daily  We have sent the following medications to your pharmacy for you to pick up at your convenience: Zofran 4 mg Dicyclomine Omeprazole   Please follow antireflux measures. We have provided you with this information today.  Please follow up with Dr Silverio Decamp in 3-4 months in the office.  If you are age 51 or younger, your body mass index should be between 19-25. Your Body mass index is 26.54 kg/m. If this is out of the aformentioned range listed, please consider follow up with your Primary Care Provider.   Due to recent changes in healthcare laws, you may see the results of your imaging and laboratory studies on MyChart before your provider has had a chance to review them.  We understand that in some cases there may be results that are confusing or concerning to you. Not all laboratory results come back in the same time frame and the provider may be waiting for multiple results in order to interpret others.  Please give [name] 48 hours in order for your provider to thoroughly review all the results before contacting the office for clarification of your results.    I appreciate the  opportunity to care for you  Thank You   Harl Bowie , MD

## 2020-05-23 NOTE — Progress Notes (Signed)
[name] [name]    [id number]    1970-09-16  Primary Care Physician:Lowne Cheri Rous Alferd Apa, DO  Referring Physician: Carollee Herter, Alferd Apa, DO [address] [place],  [place] [address]   Chief complaint:  Abdominal pain, Nausea, GERD  HPI:  49 year old very pleasant female here for new patient visit with complaints of left side abdominal pain radiating to the back and flank associated with nausea and reflux symptoms  She had exacerbation of her symptoms last summer, feels may be stress related.  She started developing left lower quadrant abdominal pain radiating back intermittently.  Denies any rectal bleeding or melena.  She also has intermittent nausea, wakes up at night sometimes with choking sensation and coughing, feels fluid brash in the back of her throat.  Complains of generalized abdominal bloating and distention.  No unintentional weight loss. Denies any dysphagia, odynophagia, vomiting, melena or blood per rectum  BM once or twice a week before starting Linzess, but since he started taking Linzess changes having more regular bowel movements    EGD July 27, 2011 by Dr. Olevia Perches: Mild gastritis otherwise normal exam  Colonoscopy May 30, 2009 by Dr. Olevia Perches for chronic abdominal pain: Normal   Outpatient Encounter Medications as of 05/23/2020  Medication Sig  . albuterol (VENTOLIN HFA) 108 (90 Base) MCG/ACT inhaler SMARTSIG:1-2 Puff(s) Via Inhaler Every 4-6 Hours PRN  . fluticasone (FLONASE) 50 MCG/ACT nasal spray Place 1 spray into both nostrils as needed for allergies or rhinitis.  . fluticasone (FLOVENT HFA) 110 MCG/ACT inhaler Inhale 2 puffs into the lungs in the morning and at bedtime.  Marland Kitchen linaclotide (LINZESS) 72 MCG capsule Take 1 capsule (72 mcg total) by mouth daily before breakfast.  . valACYclovir (VALTREX) 1000 MG tablet 1 tab po daily.  Increased to bid x 3 days with symptoms. (Patient taking differently: Take 1,000 mg by mouth  as needed. )  . beclomethasone (QVAR REDIHALER) 40 MCG/ACT inhaler Inhale 2 puffs into the lungs 2 (two) times daily. (Patient not taking: Reported on 05/23/2020)  . [DISCONTINUED] GABAPENTIN PO Take by mouth as needed.  . [DISCONTINUED] Semaglutide-Weight Management (WEGOVY) 0.25 MG/0.5ML SOAJ Inject 0.5 mLs (0.25 mg total) into the skin once a week.   No facility-administered encounter medications on file as of 05/23/2020.    Allergies as of 05/23/2020 - Review Complete 05/23/2020  Allergen Reaction Noted  . Aspirin  05/14/2009  . Doxycycline Nausea And Vomiting 11/15/2012  . Latex  07/08/2011  . Sulfonamide derivatives  05/14/2009    Past Medical History:  Diagnosis Date  . Abnormal Pap smear, atypical squamous cells of undetermined sign (ASC-US) 10/98   CIN 1  . Atypical nevus 07/29/2010   Right Medial Buttock-Moderate  . Atypical nevus x 2 11/15/2006   Upper Right Back-Moderate and Lower Mid Back-Moderate to Marked (w/s)  . Atypical nevus x 3 11/04/2007   Right Side-Slight to Moderate, Right Upper Side-Moderate, and Left Upper Breast-Moderate  . Atypical nevus x 7 10/15/2006   Right Medial Hip-Moderate, Right Middle Hip-Slight, Right Lateral Hip-Mild, Right Axillary Vault-Moderate(w/s), Right Post Calf-Moderate(w/s),Right Inner Buttock-Slight, and Left Mid Back-Moderate(w/s)  . Condyloma acuminata 2001  . Endometriosis 8/95   DepoLupron 4/96-10/96  . Hiatal hernia   . History of campylobacteriosis   . HSV-2 infection   . IBS (irritable bowel syndrome)   . Internal thrombosed hemorrhoids   . Migraine    without aura  . 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 AUGMENTATION MAMMAPLASTY Bilateral   . BREAST BIOPSY Left    benign  . BREAST ENHANCEMENT SURGERY  02/2000  . BREAST MASS EXCISION Right 12/01   fibroadenoma  . EXPLORATORY LAPAROTOMY  8/95   endometriosis     Family History  Problem Relation Age of Onset  . Irritable bowel syndrome Mother   . Hernia Mother   . Ulcers Maternal Grandfather        gastrectomy  . Leukemia Maternal Grandfather   . Diabetes Maternal Grandfather   . Prostate cancer Maternal Grandfather   . Diabetes Maternal Grandmother   . Coronary artery disease Maternal Grandmother   . Stroke Maternal Grandmother   . Heart failure Maternal Grandmother   . Colon cancer Neg Hx   . Esophageal cancer Neg Hx   . Liver disease Neg Hx   . Pancreatic cancer Neg Hx   . Stomach cancer Neg Hx     Social History   Socioeconomic History  . Marital status: Married    Spouse name: Not on file  . Number of children: 0  . Years of education: Not on file  . Highest education level: Not on file  Occupational History    Employer: [employer]  Tobacco Use  . Smoking status: Never Smoker  . Smokeless tobacco: Never Used  Vaping Use  . Vaping Use: Never used  Substance and Sexual Activity  . Alcohol use: Yes    Comment: occasionally  . Drug use: No  . Sexual activity: Yes    Birth control/protection: None  Other Topics Concern  . Not on file  Social History Narrative   Currently unemployed --- looking for a job    Social Determinants of Health   Financial Resource Strain:   . Difficulty of Paying Living Expenses: Not on file  Food Insecurity:   . Worried About Charity fundraiser in the Last Year: Not on file  . Ran Out of Food in the Last Year: Not on file  Transportation Needs:   . Lack of Transportation (Medical): Not on file  . Lack of Transportation (Non-Medical): Not on file  Physical Activity:   . Days of Exercise per Week: Not on file  . Minutes of Exercise per Session: Not on file  Stress:   . Feeling of Stress : Not on file  Social Connections:   . Frequency of Communication with Friends and Family: Not on file  . Frequency of Social Gatherings with Friends and Family: Not on file  . Attends  Religious Services: Not on file  . Active Member of Clubs or Organizations: Not on file  . Attends Archivist Meetings: Not on file  . Marital Status: Not on file  Intimate Partner Violence:   . Fear of Current or Ex-Partner: Not on file  . Emotionally Abused: Not on file  . Physically Abused: Not on file  . Sexually Abused: Not on file      Review of systems: All other review of systems negative except as mentioned in the HPI.   Physical Exam: Vitals:   05/23/20 0856  BP: 100/70  Pulse: (!) 109  SpO2: 99%   Body mass index is 26.54 kg/m. Gen:      No acute distress HEENT:  sclera anicteric Abd:      soft, non-tender; no palpable masses, mild distension  Ext:    No edema Neuro: alert and oriented x 3 Psych: normal mood and affect  Data Reviewed:  Reviewed labs, radiology imaging, old records and pertinent past GI work up   Assessment and Plan/Recommendations:  49 year old very pleasant female here for evaluation of left lower quadrant abdominal pain, nausea and GERD symptoms.  Left lower quadrant abdominal pain: Unclear etiology, will need to exclude diverticular disease.  Possible irritable bowel syndrome We will do a trial of dicyclomine 20 mg every 8 hours as needed  Schedule for colonoscopy for colorectal cancer screening, is due for it  If continues to have persistent abdominal discomfort, will consider imaging for further evaluation  Chronic idiopathic constipation: Continue Linzess 72 mcg daily Continue with high-fiber diet and fluid intake  Nausea: Could be secondary to uncontrolled acid reflux Start omeprazole 20 mg daily Antireflux measures Schedule for EGD to exclude hiatal hernia, peptic ulcer disease or erosive esophagitis or gastritis Use Zofran 4 mg daily as needed  The risks and benefits as well as alternatives of endoscopic procedure(s) have been discussed and reviewed. All questions answered. The patient agrees to proceed.  Return  in 3 to 4 months or sooner if needed  The patient was provided an opportunity to ask questions and all were answered. The patient agreed with the plan and demonstrated an understanding of the instructions.  Damaris Hippo , MD    CC: Carollee Herter, Alferd Apa, *

## 2020-06-04 ENCOUNTER — Encounter: Payer: BC Managed Care – PPO | Admitting: Family Medicine

## 2020-06-26 ENCOUNTER — Encounter: Payer: Self-pay | Admitting: Gastroenterology

## 2020-06-26 ENCOUNTER — Ambulatory Visit: Payer: 59 | Admitting: Dermatology

## 2020-06-26 ENCOUNTER — Other Ambulatory Visit: Payer: Self-pay

## 2020-06-26 DIAGNOSIS — L309 Dermatitis, unspecified: Secondary | ICD-10-CM | POA: Diagnosis not present

## 2020-06-26 MED ORDER — CLOBETASOL PROPIONATE 0.05 % EX CREA: 1.0000 "application " | TOPICAL_CREAM | Freq: Two times a day (BID) | CUTANEOUS | 3 refills | Status: DC

## 2020-06-27 ENCOUNTER — Telehealth: Payer: Self-pay | Admitting: Gastroenterology

## 2020-06-27 NOTE — Telephone Encounter (Signed)
Pt is scheduled to see Dr. Silverio Decamp on 11/19. Pt wanted to know if she could eat breakfast today, and take her medication. Advised pt that she could take her medication but she could not eat this morning. Advised patient to stay on clear liquids all day today. Gave pt [facility] numbers to call back with any further concerns. Pt verbalized understanding.

## 2020-06-28 ENCOUNTER — Encounter: Payer: Self-pay | Admitting: Gastroenterology

## 2020-06-28 ENCOUNTER — Ambulatory Visit (AMBULATORY_SURGERY_CENTER): Payer: 59 | Admitting: Gastroenterology

## 2020-06-28 ENCOUNTER — Other Ambulatory Visit: Payer: Self-pay

## 2020-06-28 VITALS — BP 144/91 | HR 85 | Temp 98.4°F | Resp 17 | Ht 67.0 in | Wt 172.0 lb

## 2020-06-28 DIAGNOSIS — K635 Polyp of colon: Secondary | ICD-10-CM

## 2020-06-28 DIAGNOSIS — K219 Gastro-esophageal reflux disease without esophagitis: Secondary | ICD-10-CM | POA: Diagnosis not present

## 2020-06-28 DIAGNOSIS — Z1211 Encounter for screening for malignant neoplasm of colon: Secondary | ICD-10-CM | POA: Diagnosis not present

## 2020-06-28 DIAGNOSIS — K581 Irritable bowel syndrome with constipation: Secondary | ICD-10-CM

## 2020-06-28 DIAGNOSIS — R110 Nausea: Secondary | ICD-10-CM

## 2020-06-28 DIAGNOSIS — K5904 Chronic idiopathic constipation: Secondary | ICD-10-CM

## 2020-06-28 DIAGNOSIS — D123 Benign neoplasm of transverse colon: Secondary | ICD-10-CM

## 2020-06-28 DIAGNOSIS — K2270 Barrett's esophagus without dysplasia: Secondary | ICD-10-CM

## 2020-06-28 DIAGNOSIS — D120 Benign neoplasm of cecum: Secondary | ICD-10-CM

## 2020-06-28 MED ORDER — SODIUM CHLORIDE 0.9 % IV SOLN: 500.0000 mL | Freq: Once | INTRAVENOUS | Status: DC

## 2020-06-28 NOTE — Progress Notes (Signed)
Called to room to assist during endoscopic procedure.  Patient ID and intended procedure confirmed with present staff. Received instructions for my participation in the procedure from the performing physician.  

## 2020-06-28 NOTE — Op Note (Signed)
[facility] Patient Name: [name] [name] Procedure Date: 06/28/2020 2:15 PM MRN: [id number] Endoscopist: Mauri Pole , MD Age: 49 Referring MD:  Date of Birth: Jan 28, 1971 Gender: Female Account #: [id number] Procedure:                Colonoscopy Indications:              Screening for colorectal malignant neoplasm Medicines:                Monitored Anesthesia Care Procedure:                Pre-Anesthesia Assessment:                           - Prior to the procedure, a History and Physical                            was performed, and patient medications and                            allergies were reviewed. The patient's tolerance of                            previous anesthesia was also reviewed. The risks                            and benefits of the procedure and the sedation                            options and risks were discussed with the patient.                            All questions were answered, and informed consent                            was obtained. Prior Anticoagulants: The patient has                            taken no previous anticoagulant or antiplatelet                            agents. ASA Grade Assessment: II - A patient with                            mild systemic disease. After reviewing the risks                            and benefits, the patient was deemed in                            satisfactory condition to undergo the procedure.                           After obtaining informed consent, the colonoscope  was passed under direct vision. Throughout the                            procedure, the patient's blood pressure, pulse, and                            oxygen saturations were monitored continuously. The                            Colonoscope was introduced through the anus and                            advanced to the the cecum, identified by                            appendiceal  orifice and ileocecal valve. The                            colonoscopy was performed without difficulty. The                            patient tolerated the procedure well. The quality                            of the bowel preparation was good. The ileocecal                            valve, appendiceal orifice, and rectum were                            photographed. Scope In: 2:16:51 PM Scope Out: 2:38:37 PM Scope Withdrawal Time: 0 hours 16 minutes 52 seconds  Total Procedure Duration: 0 hours 21 minutes 46 seconds  Findings:                 The perianal and digital rectal examinations were                            normal.                           Two sessile polyps were found in the transverse                            colon and cecum. The polyps were 5 to 8 mm in size.                            These polyps were removed with a cold snare.                            Resection and retrieval were complete.                           A 15 mm polyp was found in the cecum. The polyp was  sessile. The polyp was removed with a piecemeal                            technique using a cold snare. Resection and                            retrieval were complete.                           A few small-mouthed diverticula were found in the                            sigmoid colon and descending colon.                           Non-bleeding internal hemorrhoids were found during                            retroflexion. The hemorrhoids were medium-sized. Complications:            No immediate complications. Estimated Blood Loss:     Estimated blood loss was minimal. Impression:               - Two 5 to 8 mm polyps in the transverse colon and                            in the cecum, removed with a cold snare. Resected                            and retrieved.                           - One 15 mm polyp in the cecum, removed piecemeal                            using  a cold snare. Resected and retrieved.                           - Diverticulosis in the sigmoid colon and in the                            descending colon.                           - Non-bleeding internal hemorrhoids. Recommendation:           - Patient has a contact number available for                            emergencies. The signs and symptoms of potential                            delayed complications were discussed with the                            patient.  Return to normal activities tomorrow.                            Written discharge instructions were provided to the                            patient.                           - Resume previous diet.                           - Continue present medications.                           - Await pathology results.                           - Repeat colonoscopy in 1 year for surveillance                            based on pathology results. Mauri Pole, MD 06/28/2020 2:54:20 PM This report has been signed electronically.

## 2020-06-28 NOTE — Patient Instructions (Signed)
Handouts given for polyps, diverticulosis, and  Hemorrhoids.  YOU HAD AN ENDOSCOPIC PROCEDURE TODAY AT [facility]:   Refer to the procedure report that was given to you for any specific questions about what was found during the examination.  If the procedure report does not answer your questions, please call your gastroenterologist to clarify.  If you requested that your care partner not be given the details of your procedure findings, then the procedure report has been included in a sealed envelope for you to review at your convenience later.  YOU SHOULD EXPECT: Some feelings of bloating in the abdomen. Passage of more gas than usual.  Walking can help get rid of the air that was put into your GI tract during the procedure and reduce the bloating. If you had a lower endoscopy (such as a colonoscopy or flexible sigmoidoscopy) you may notice spotting of blood in your stool or on the toilet paper. If you underwent a bowel prep for your procedure, you may not have a normal bowel movement for a few days.  Please Note:  You might notice some irritation and congestion in your nose or some drainage.  This is from the oxygen used during your procedure.  There is no need for concern and it should clear up in a day or so.  SYMPTOMS TO REPORT IMMEDIATELY:   Following lower endoscopy (colonoscopy or flexible sigmoidoscopy):  Excessive amounts of blood in the stool  Significant tenderness or worsening of abdominal pains  Swelling of the abdomen that is new, acute  Fever of 100F or higher   Following upper endoscopy (EGD)  Vomiting of blood or coffee ground material  New chest pain or pain under the shoulder blades  Painful or persistently difficult swallowing  New shortness of breath  Fever of 100F or higher  Black, tarry-looking stools  For urgent or emergent issues, a gastroenterologist can be reached at any hour by calling [phone]. Do not use MyChart messaging for urgent  concerns.    DIET:  We do recommend a small meal at first, but then you may proceed to your regular diet.  Drink plenty of fluids but you should avoid alcoholic beverages for 24 hours.  ACTIVITY:  You should plan to take it easy for the rest of today and you should NOT DRIVE or use heavy machinery until tomorrow (because of the sedation medicines used during the test).    FOLLOW UP: Our staff will call the number listed on your records 48-72 hours following your procedure to check on you and address any questions or concerns that you may have regarding the information given to you following your procedure. If we do not reach you, we will leave a message.  We will attempt to reach you two times.  During this call, we will ask if you have developed any symptoms of COVID 19. If you develop any symptoms (ie: fever, flu-like symptoms, shortness of breath, cough etc.) before then, please call [phone].  If you test positive for Covid 19 in the 2 weeks post procedure, please call and report this information to [name].    If any biopsies were taken you will be contacted by phone or by letter within the next 1-3 weeks.  Please call us at [phone] if you have not heard about the biopsies in 3 weeks.    SIGNATURES/CONFIDENTIALITY: You and/or your care partner have signed paperwork which will be entered into your electronic medical record.  These signatures attest  to the fact that that the information above on your After Visit Summary has been reviewed and is understood.  Full responsibility of the confidentiality of this discharge information lies with you and/or your care-partner. 

## 2020-06-28 NOTE — Progress Notes (Addendum)
Vitals by CW Pt did not hold her Phentermine prior to procedure. Osvaldo Angst, CRNA made aware.  Ok to proceed.

## 2020-06-28 NOTE — Op Note (Signed)
[facility] Patient Name: [name] [name] Procedure Date: 06/28/2020 1:59 PM MRN: [id number] Endoscopist: Mauri Pole , MD Age: 49 Referring MD:  Date of Birth: 07/08/1971 Gender: Female Account #: [id number] Procedure:                Upper GI endoscopy Indications:              Esophageal reflux symptoms that recur despite                            appropriate therapy Medicines:                Monitored Anesthesia Care Procedure:                Pre-Anesthesia Assessment:                           - Prior to the procedure, a History and Physical                            was performed, and patient medications and                            allergies were reviewed. The patient's tolerance of                            previous anesthesia was also reviewed. The risks                            and benefits of the procedure and the sedation                            options and risks were discussed with the patient.                            All questions were answered, and informed consent                            was obtained. Prior Anticoagulants: The patient has                            taken no previous anticoagulant or antiplatelet                            agents. ASA Grade Assessment: II - A patient with                            mild systemic disease. After reviewing the risks                            and benefits, the patient was deemed in                            satisfactory condition to undergo the procedure.  After obtaining informed consent, the endoscope was                            passed under direct vision. Throughout the                            procedure, the patient's blood pressure, pulse, and                            oxygen saturations were monitored continuously. The                            Endoscope was introduced through the mouth, and                            advanced to the second part of  duodenum. The upper                            GI endoscopy was accomplished without difficulty.                            The patient tolerated the procedure well. Scope In: Scope Out: Findings:                 LA Grade C (one or more mucosal breaks continuous                            between tops of 2 or more mucosal folds, less than                            75% circumference) esophagitis with no bleeding and                            mucosal nodularity was found 36 to 37 cm from the                            incisors. Biopsies were taken with a cold forceps                            for histology.                           The entire examined stomach was normal.                           The gastroesophageal flap valve was visualized                            endoscopically and classified as Hill Grade II                            (fold present, opens with respiration).  The examined duodenum was normal. Complications:            No immediate complications. Estimated Blood Loss:     Estimated blood loss was minimal. Impression:               - LA Grade C reflux esophagitis with no bleeding.                            Biopsied.                           - Normal stomach.                           - Gastroesophageal flap valve classified as Hill                            Grade II (fold present, opens with respiration).                           - Normal examined duodenum. Recommendation:           - Resume previous diet.                           - Continue present medications.                           - Await pathology results.                           - Repeat upper endoscopy after studies are complete                            for surveillance based on pathology results.                           - Follow up in GI office in 4 months, please call                            to schedule appointment Mauri Pole, MD 06/28/2020 2:47:07  PM This report has been signed electronically.

## 2020-06-28 NOTE — Progress Notes (Signed)
A/ox3, pleased with MAC, report to RN 

## 2020-06-30 ENCOUNTER — Encounter: Payer: Self-pay | Admitting: Dermatology

## 2020-06-30 NOTE — Progress Notes (Addendum)
   Follow-Up Visit   Subjective  [name] [name] is a 49 y.o. female who presents for the following: Skin Problem (patient has a few spots on both legs she wants looked at.).  Spots Location: Legs Duration:  Quality:  Associated Signs/Symptoms: Modifying Factors:  Severity:  Timing: Context:   Objective  Well appearing patient in no apparent distress; mood and affect are within normal limits.  A focused examination was performed including , Neck, back, arms, legs.. Relevant physical exam findings are noted in the Assessment and Plan.   Assessment & Plan    Dermatitis (2) Left Lower Leg - Anterior; Right Lower Leg - Anterior  Patient will use the clobetasol daily after bathing for 1 month. Follow up and if cream doesn't help possible biopsy at the follow up.   clobetasol cream (TEMOVATE) 0.05 % - Left Lower Leg - Anterior, Right Lower Leg - Anterior     I, Lavonna Monarch, MD, have reviewed all documentation for this visit.  The documentation on 09/21/20 for the exam, diagnosis, procedures, and orders are all accurate and complete.

## 2020-07-01 ENCOUNTER — Telehealth: Payer: Self-pay

## 2020-07-01 NOTE — Telephone Encounter (Signed)
NO ANSWER, MESSAGE LEFT FOR PATIENT. 

## 2020-07-01 NOTE — Telephone Encounter (Signed)
  Follow up Call-  Call back number 06/28/2020  Post procedure Call Back phone  # [phone]  Permission to leave phone message Yes  Some recent data might be hidden     Patient questions:  Do you have a fever, pain , or abdominal swelling? No. Pain Score  0 *  Have you tolerated food without any problems? Yes.    Have you been able to return to your normal activities? Yes.    Do you have any questions about your discharge instructions: Diet   No. Medications  No. Follow up visit  No.  Do you have questions or concerns about your Care? No.  Actions: * If pain score is 4 or above: No action needed, pain <4.  1. Have you developed a fever since your procedure? no  2.   Have you had an respiratory symptoms (SOB or cough) since your procedure? no  3.   Have you tested positive for COVID 19 since your procedure no  4.   Have you had any family members/close contacts diagnosed with the COVID 19 since your procedure?  no   If yes to any of these questions please route to Joylene John, RN and Joella Prince, RN

## 2020-07-04 ENCOUNTER — Other Ambulatory Visit: Payer: Self-pay | Admitting: Gastroenterology

## 2020-07-10 ENCOUNTER — Telehealth: Payer: Self-pay | Admitting: Gastroenterology

## 2020-07-10 DIAGNOSIS — K5901 Slow transit constipation: Secondary | ICD-10-CM

## 2020-07-10 MED ORDER — LINACLOTIDE 72 MCG PO CAPS: 72.0000 ug | ORAL_CAPSULE | Freq: Every day | ORAL | 2 refills | Status: DC

## 2020-07-10 NOTE — Telephone Encounter (Signed)
Spoke with the patient.  She knows the biopsy report shows Barrett's esophagus. Briefly explained this condition. Patient states the Omeprazole "doesn't do anything." She continues to have nausea. She has Prevacid 30 mg in the home. She would like to try that in place of Omeprazole. Discussed taking it daily on an empty stomach.  She asks for a refill of the Linzess 72 mcg. This was originally prescribed by the PCP, but the patient would like for Dr Silverio Decamp to take over with this.

## 2020-07-11 ENCOUNTER — Other Ambulatory Visit: Payer: Self-pay

## 2020-07-11 MED ORDER — LANSOPRAZOLE 30 MG PO CPDR: 30.0000 mg | DELAYED_RELEASE_CAPSULE | Freq: Every day | ORAL | 3 refills | Status: DC

## 2020-07-11 NOTE — Telephone Encounter (Signed)
Med list updated.  Recalls entered for [place] and the office visit.

## 2020-07-11 NOTE — Telephone Encounter (Signed)
Call patient discussed results.  We will plan for repeat EGD and colonoscopy in 1 year.  Barrett's esophagus with erosive esophagitis, will need to document healing of erosive esophagitis and exclude any dysplasia.  She had piecemeal removal of sessile serrated polyps. Okay to switch to Prevacid 30 mg daily.  Follow-up in office visit in 6 months.  Thank you

## 2020-07-16 ENCOUNTER — Encounter: Payer: Self-pay | Admitting: Gastroenterology

## 2020-07-22 ENCOUNTER — Telehealth: Payer: Self-pay | Admitting: Dermatology

## 2020-07-22 ENCOUNTER — Ambulatory Visit: Payer: 59 | Admitting: Dermatology

## 2020-07-22 NOTE — Telephone Encounter (Signed)
Patient's appointment was at 8:15 with arrival time of 8:00. This patient called our office at 8:18 to inform us that she was running late. I told her that we would have to R/S her appointment. She states that we are the only office that she goes to that would do this and that it was out of her control that she was late due to traffic. I R/S this patient for next opening in Feb. She was not happy with this and states that ST wanted to see her to find out if medication was working and would like to be seen in December. She asked that message be sent to see if we can accommodate her.

## 2020-07-22 NOTE — Telephone Encounter (Signed)
Phone call to patient to let her know unfortunately the feb 3rd appointment is the 1st avaliable. Told patient to call with any questions or concerns. Left patient message.

## 2020-08-08 ENCOUNTER — Other Ambulatory Visit: Payer: Self-pay | Admitting: Family Medicine

## 2020-09-10 IMAGING — DX DG CHEST 1V PORT
1 series · 1 of 1 positions shown · non-contrast
Comparison: None.

CLINICAL DATA: Chest pain and cardiac palpitations

EXAM:
PORTABLE CHEST 1 VIEW

[chest ap]
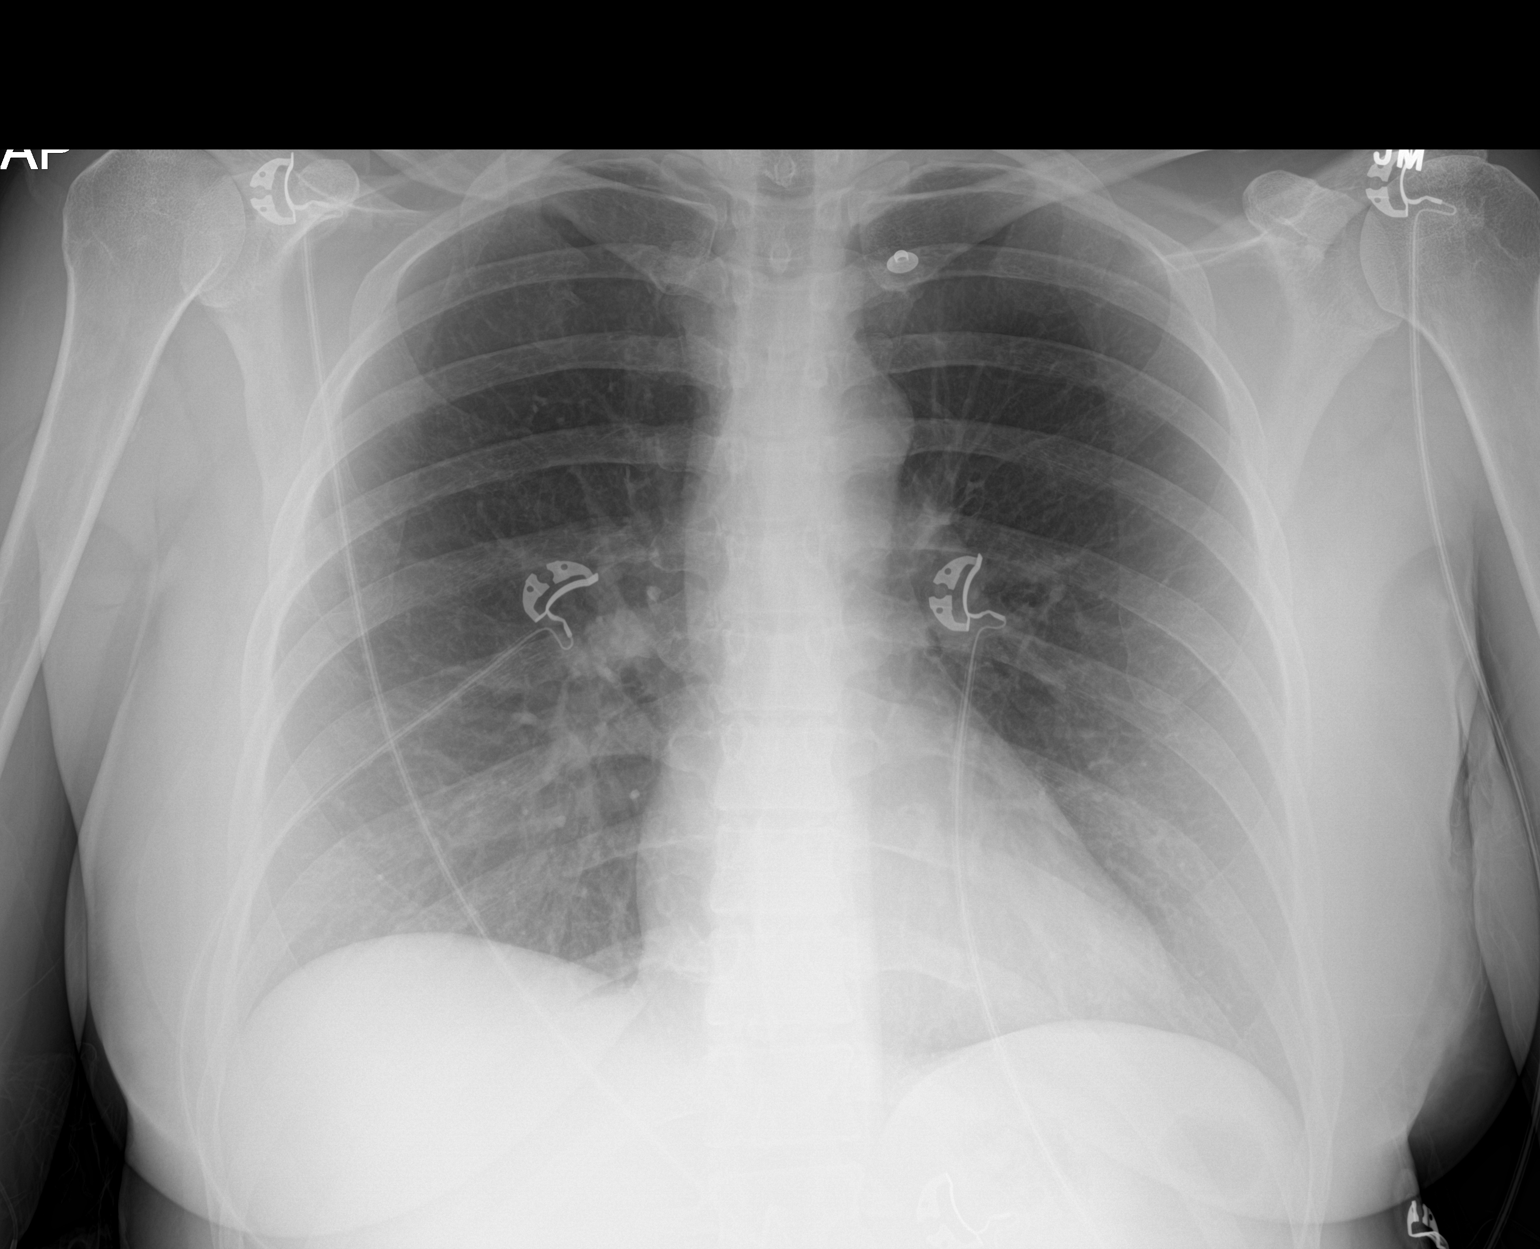

[1 of 1 positions shown; findings below may reference images not displayed]

FINDINGS: Lungs are clear. Heart size and pulmonary vascularity are normal. No
adenopathy. No pneumothorax. No bone lesions.
IMPRESSION: No edema or consolidation.

## 2020-09-12 ENCOUNTER — Encounter: Payer: Self-pay | Admitting: Dermatology

## 2020-09-12 ENCOUNTER — Ambulatory Visit (INDEPENDENT_AMBULATORY_CARE_PROVIDER_SITE_OTHER): Payer: 59 | Admitting: Dermatology

## 2020-09-12 ENCOUNTER — Other Ambulatory Visit: Payer: Self-pay

## 2020-09-12 DIAGNOSIS — B078 Other viral warts: Secondary | ICD-10-CM | POA: Diagnosis not present

## 2020-09-12 DIAGNOSIS — L309 Dermatitis, unspecified: Secondary | ICD-10-CM | POA: Diagnosis not present

## 2020-09-19 ENCOUNTER — Telehealth: Payer: Self-pay | Admitting: Dermatology

## 2020-09-19 NOTE — Telephone Encounter (Signed)
Left message for patient to call us back.  We need to know the name of the medication.

## 2020-09-19 NOTE — Telephone Encounter (Signed)
ST patient. Place on leg is getting bigger. Cream she's been using (doesn't know name) isn't working. Wants something else called In to [facility] on [facility]

## 2020-09-21 ENCOUNTER — Encounter: Payer: Self-pay | Admitting: Dermatology

## 2020-09-21 NOTE — Progress Notes (Signed)
   Follow-Up Visit   Subjective  [name] [name] is a 50 y.o. female who presents for the following: Dermatitis (Clobetasol treatment improved ).  Inflamed spots Location: Legs Duration:  Quality: Improved Associated Signs/Symptoms: Modifying Factors: [place] Severity:  Timing: Context:   Objective  Well appearing patient in no apparent distress; mood and affect are within normal limits. Objective  Left Lower Leg - Anterior, Right Lower Leg - Anterior: Percent improvement in inflammatory leg lesions.  Residual spots are still may represent a mixture of keratoses/DSAP plus nummular eczema.  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  Objective  Left Knee - Anterior: Half dozen 1 to 3 mm flesh-colored flattopped slightly raised papules verrucous papules -- Discussed viral etiology and contagion.     A focused examination was performed including Legs.. Relevant physical exam findings are noted in the Assessment and Plan.   Assessment & Plan    Dermatitis (2) Left Lower Leg - Anterior; Right Lower Leg - Anterior  Much improvement with topical therapy. Okay to continue with topical cream as needed.  Other Related Medications clobetasol cream (TEMOVATE) 0.05 %  Verruca plana Left Knee - Anterior  Benign okay to leave unless patient wants removed.      I, Lavonna Monarch, MD, have reviewed all documentation for this visit.  The documentation on 09/21/20 for the exam, diagnosis, procedures, and orders are all accurate and complete.

## 2020-09-23 NOTE — Telephone Encounter (Signed)
Twice daily for the next 1 week have her first apply the clobetasol and put a tepid moist wrap over the clobetasol for 20 to 30 minutes followed by drying the area and reapplication of the clobetasol.  Please contact us in 1 week with a status update.

## 2020-09-23 NOTE — Telephone Encounter (Signed)
Patient left message on  Office voice mail that she was returning our call.

## 2020-09-23 NOTE — Telephone Encounter (Signed)
Phone call to patient to see if she knows the name of the cream that she's been using?  Patient states she doesn't know the name of the cream, all she knows is that it's the cream that was given at her last appointment.  After looking at the patient's last apointment I see that the medication Dr. Denna Haggard prescribed the patient was Clobetasol Cream.  I asked patient if this is the correct cream, patient states she's not sure if that's the correct cream or not.  Patient states that the area is now turning blue and her eczema has never done that before, she wants to know what's that next step she can do?  I informed patient I would get her message to Dr. Denna Haggard and someone will call her back.

## 2020-09-23 NOTE — Telephone Encounter (Signed)
Phone call to patient with Dr. Tafeen's recommendations.  Patient aware. 

## 2020-10-27 ENCOUNTER — Other Ambulatory Visit: Payer: Self-pay | Admitting: Obstetrics & Gynecology

## 2020-10-27 DIAGNOSIS — B009 Herpesviral infection, unspecified: Secondary | ICD-10-CM

## 2020-12-12 ENCOUNTER — Other Ambulatory Visit: Payer: Self-pay | Admitting: Gastroenterology

## 2020-12-12 DIAGNOSIS — K5901 Slow transit constipation: Secondary | ICD-10-CM

## 2021-02-03 ENCOUNTER — Telehealth: Payer: Self-pay | Admitting: Gastroenterology

## 2021-02-03 NOTE — Telephone Encounter (Signed)
Spoke with the patient. It started feeling uncomfortable about a week ago. She started using witch hazel wipes, taking epsom salts sitz bathes and using OTC cream. It has not improved and has actually become more painful. Appointment scheduled 02/06/21 at 11:30 am. Patient agrees to this appointment.

## 2021-02-06 ENCOUNTER — Ambulatory Visit (INDEPENDENT_AMBULATORY_CARE_PROVIDER_SITE_OTHER): Payer: Self-pay | Admitting: Nurse Practitioner

## 2021-02-06 ENCOUNTER — Encounter: Payer: Self-pay | Admitting: Nurse Practitioner

## 2021-02-06 VITALS — BP 130/80 | HR 96 | Ht 67.0 in | Wt 172.6 lb

## 2021-02-06 DIAGNOSIS — K649 Unspecified hemorrhoids: Secondary | ICD-10-CM

## 2021-02-06 NOTE — Patient Instructions (Signed)
Start stool softener nightly.   Continue Linzess.   Colonoscopy is due in December 2022.   Call if symptoms do not improve.

## 2021-02-06 NOTE — Progress Notes (Addendum)
ASSESSMENT AND PLAN    # 50 yo female with recent onset of rectal pain.  She has an inflamed, nonthrombosed external hemorrhoid on exam.  No obvious fissure.  Symptoms already improving with tub soaks and OTC hemorrhoidal preparations.  -- Patient will call us if symptoms do not continue to improve --She has chronic constipation.  We discussed the relationship between hemorrhoids and hard stool/straining.  She will continue Linzess 72 mcg daily.  Continue good hydration.  We will add a stool softener at night.   #History sessile serrated colon polyps including 15 mm cecal polyp in November 2021.  She is due for polyp surveillance colonoscopy in December  # GERD with Barrett's esophagus ( biopsies Nov 2021). A 1 year follow-up EGD was recommended  HISTORY OF PRESENT ILLNESS    Chief Complaint : hemorrhoids  [name] [name] is a 50 y.o. female known to Dr. Silverio Decamp with a past medical history significant for chronic idiopathic constipation, colon polyps and GERD. See PMH below for any additional medical problems.    Patient established care here 05/23/2020 at that time LLQ pain, nausea and GERD symptoms, please refer to that office note.   INTERVAL HISTORY: [name] [name] is here with recent rectal pain which she thinks is hemorrhoid related . Last week she began having significant rectal pain to the point it even hurt to move legs. She noticed a bluish firm lesion at opening of anus. She doesn't recall having been constipated / straining  prior to onset of symptoms though sometimes she does have a hard stool despite daily Linziess.  She is feeling better now. She has been taking tub baths and a few days ago  started using an OTC hemorrhoid ointment .No rectal bleeding.    PREVIOUS ENDOSCOPIC EVALUATIONS / PERTINENT STUDIES:   November 2021 EGD  LA grade C reflux esophagitis.  Normal stomach and normal duodenum Esophageal biopsies consistent with Barrett's esophagus.  No dysplasia.     November 2021 screening colonoscopy Good prep.  2 sessile polyps ranging 5 to 8 mm in size were removed.  A 15 mm sessile cecal polyp was removed.  A few diverticula and nonbleeding internal hemorrhoids found.  Polyps were sessile serrated without cytologic dysplasia    Past Medical History:  Diagnosis Date   Abnormal Pap smear, atypical squamous cells of undetermined sign (ASC-US) 10/98   CIN 1   Allergy    Asthma    Atypical nevus 07/29/2010   Right Medial Buttock-Moderate   Atypical nevus x 2 11/15/2006   Upper Right Back-Moderate and Lower Mid Back-Moderate to Marked (w/s)   Atypical nevus x 3 11/04/2007   Right Side-Slight to Moderate, Right Upper Side-Moderate, and Left Upper Breast-Moderate   Atypical nevus x 7 10/15/2006   Right Medial Hip-Moderate, Right Middle Hip-Slight, Right Lateral Hip-Mild, Right Axillary Vault-Moderate(w/s), Right Post Calf-Moderate(w/s),Right Inner Buttock-Slight, and Left Mid Back-Moderate(w/s)   Condyloma acuminata 2001   Endometriosis 8/95   DepoLupron 4/96-10/96   Hiatal hernia    History of campylobacteriosis    HSV-2 infection    Hyperlipidemia    IBS (irritable bowel syndrome)    Internal thrombosed hemorrhoids    Migraine    without aura   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   Current Outpatient Medications  Medication Sig Dispense Refill  albuterol (VENTOLIN HFA) 108 (90 Base) MCG/ACT inhaler SMARTSIG:1-2 Puff(s) Via Inhaler Every 4-6 Hours PRN     beclomethasone (QVAR REDIHALER) 40 MCG/ACT inhaler Inhale 2 puffs into the lungs 2 (two) times daily. 10.6 g 5   dicyclomine (BENTYL) 20 MG tablet Take 1 tablet (20 mg total) by mouth every 8 (eight) hours as needed for spasms. 90 tablet 2   fluticasone (FLONASE) 50 MCG/ACT nasal spray  Place 1 spray into both nostrils as needed for allergies or rhinitis.     fluticasone (FLOVENT HFA) 110 MCG/ACT inhaler Inhale 2 puffs into the lungs in the morning and at bedtime. 1 each 12   lansoprazole (PREVACID) 30 MG capsule Take 1 capsule (30 mg total) by mouth daily at 12 noon. 90 capsule 3   LINZESS 72 MCG capsule TAKE 1 CAPSULE BY MOUTH DAILY BEFORE BREAKFAST. 30 capsule 2   ondansetron (ZOFRAN) 4 MG tablet Take 1 tablet (4 mg total) by mouth every 12 (twelve) hours as needed for nausea or vomiting. 30 tablet 1   phentermine 37.5 MG capsule Take 37.5 mg by mouth every morning.     valACYclovir (VALTREX) 1000 MG tablet TAKE 1 TABLET BY MOUTH DAILY. INCREASED TO TWICE DAILY FOR 3 DAYS WITH SYMPTOMS. 90 tablet 1   No current facility-administered medications for this visit.    Review of Systems: No chest pain. No shortness of breath. No urinary complaints.   PHYSICAL EXAM :    Wt Readings from Last 3 Encounters:  02/06/21 172 lb 9.6 oz (78.3 kg)  06/28/20 172 lb (78 kg)  05/23/20 172 lb (78 kg)    BP 130/80   Pulse 96   Ht 5\' 7"  (1.702 m)   Wt 172 lb 9.6 oz (78.3 kg)   SpO2 97%   BMI 27.03 kg/m  Constitutional:  Pleasant female in no acute distress. Psychiatric: Normal mood and affect. Behavior is normal. Cardiovascular: Normal rate, regular rhythm. No edema Pulmonary/chest: Effort normal and breath sounds normal. No wheezing, rales or rhonchi. Abdominal: Soft, nondistended, nontender. Bowel sounds active throughout. There are no masses palpable. No hepatomegaly. Rectal : inflamed external hemorrhoid, non thrombosed. No obvious fissure.  Neurological: Alert and oriented to person place and time. Skin: Skin is warm and dry. No rashes noted.  Tye Savoy, NP  02/06/2021, 11:39 AM

## 2021-03-03 ENCOUNTER — Other Ambulatory Visit: Payer: Self-pay

## 2021-03-03 ENCOUNTER — Encounter: Payer: Self-pay | Admitting: Dermatology

## 2021-03-03 ENCOUNTER — Ambulatory Visit (INDEPENDENT_AMBULATORY_CARE_PROVIDER_SITE_OTHER): Payer: Self-pay | Admitting: Dermatology

## 2021-03-03 DIAGNOSIS — Z1283 Encounter for screening for malignant neoplasm of skin: Secondary | ICD-10-CM

## 2021-03-03 DIAGNOSIS — B079 Viral wart, unspecified: Secondary | ICD-10-CM

## 2021-03-05 NOTE — Progress Notes (Signed)
Reviewed and agree with documentation and assessment and plan. K. Veena Khang Hannum , MD   

## 2021-03-08 NOTE — Progress Notes (Signed)
   Follow-Up Visit   Subjective  [name] [name] is a 50 y.o. female who presents for the following: Skin Problem (Patient here today for lesion on her right forearm x couple of months. No bleeding, no pain.).  Wart on arm plus check skin Location:  Duration:  Quality:  Associated Signs/Symptoms: Modifying Factors:  Severity:  Timing: Context:   Objective  Well appearing patient in no apparent distress; mood and affect are within normal limits. Right Forearm - Anterior Verrucous papules -- Discussed viral etiology and contagion.  We will do LN2 freeze 6 seconds today but patient told to expect some residual which she may treat with over-the-counter Verruca-Freeze.  I will recheck this on a as needed basis.  Torso - Posterior (Back) Back in sun exposed areas examined, no atypical pigmented lesions or nonmelanoma skin cancer.    All sun exposed areas plus back examined.   Assessment & Plan    Viral warts, unspecified type Right Forearm - Anterior  We will do home freezing and return on a as needed basis.  Encounter for screening for malignant neoplasm of skin Torso - Posterior (Back)  Self examine with spouse twice annually.  Encouraged ultraviolet protection.      I, Lavonna Monarch, MD, have reviewed all documentation for this visit.  The documentation on 03/08/21 for the exam, diagnosis, procedures, and orders are all accurate and complete.

## 2021-03-13 ENCOUNTER — Ambulatory Visit (HOSPITAL_BASED_OUTPATIENT_CLINIC_OR_DEPARTMENT_OTHER): Payer: 59 | Admitting: Obstetrics & Gynecology

## 2021-04-07 ENCOUNTER — Ambulatory Visit (HOSPITAL_BASED_OUTPATIENT_CLINIC_OR_DEPARTMENT_OTHER): Payer: Self-pay | Admitting: Obstetrics & Gynecology

## 2021-04-08 ENCOUNTER — Telehealth: Payer: Self-pay | Admitting: Nurse Practitioner

## 2021-04-08 NOTE — Telephone Encounter (Signed)
Samples provided 

## 2021-04-08 NOTE — Telephone Encounter (Signed)
Reviewed pt record and appears Dr. Silverio Decamp approved pt to have samples of 60mg Linzess. Returned pt call and advised samples have been placed at front desk for pick up. Verbalized acceptance and understanding.

## 2021-04-08 NOTE — Telephone Encounter (Signed)
Patients mother called stated Dr. Silverio Decamp told her she would be able to provide the patient with samples of Linzess until she gets insurance.

## 2021-06-19 ENCOUNTER — Other Ambulatory Visit: Payer: Self-pay

## 2021-06-19 ENCOUNTER — Encounter (HOSPITAL_BASED_OUTPATIENT_CLINIC_OR_DEPARTMENT_OTHER): Payer: Self-pay | Admitting: Obstetrics & Gynecology

## 2021-06-19 ENCOUNTER — Ambulatory Visit (INDEPENDENT_AMBULATORY_CARE_PROVIDER_SITE_OTHER): Payer: No Typology Code available for payment source | Admitting: Obstetrics & Gynecology

## 2021-06-19 VITALS — BP 136/85 | HR 80 | Ht 67.25 in | Wt 172.0 lb

## 2021-06-19 DIAGNOSIS — B009 Herpesviral infection, unspecified: Secondary | ICD-10-CM

## 2021-06-19 DIAGNOSIS — Z1231 Encounter for screening mammogram for malignant neoplasm of breast: Secondary | ICD-10-CM

## 2021-06-19 DIAGNOSIS — Z01419 Encounter for gynecological examination (general) (routine) without abnormal findings: Secondary | ICD-10-CM | POA: Diagnosis not present

## 2021-06-19 DIAGNOSIS — Z0000 Encounter for general adult medical examination without abnormal findings: Secondary | ICD-10-CM

## 2021-06-19 DIAGNOSIS — N926 Irregular menstruation, unspecified: Secondary | ICD-10-CM

## 2021-06-19 DIAGNOSIS — N921 Excessive and frequent menstruation with irregular cycle: Secondary | ICD-10-CM

## 2021-06-19 MED ORDER — VALACYCLOVIR HCL 1 G PO TABS: ORAL_TABLET | ORAL | 2 refills | Status: DC

## 2021-06-19 MED ORDER — NORETHINDRONE ACETATE 5 MG PO TABS: 5.0000 mg | ORAL_TABLET | Freq: Two times a day (BID) | ORAL | 0 refills | Status: DC

## 2021-06-19 NOTE — Progress Notes (Signed)
50 y.o. G0P0000 Married White or Caucasian female here for annual exam.  Cycles are not irregular.  Last cycle was 06/08/2021.  Still bleeding.  Cycles started as a normal cycle but just hasn't stopped.  Having some clotting.  Has gotten better and then worse.  Last cycle prior to that time was about 6 weeks.  We have discussed endometrial ablation in the past.  She has really considered this more.  Ultrasound and endometrial biopsy recommended.    Has new job at [employer] recently.  Feels there is less stress.  Has just gotten insurance but does have open enrollment next week so needs to see about benefits.    Patient's last menstrual period was 06/08/2021 (exact date).          Sexually active: Yes.    The current method of family planning is none.    Smoker:  no  Health Maintenance: Pap:  03/05/20 neg History of abnormal Pap:  yes MMG:  05/03/20  neg Colonoscopy:  06/28/20, follow up 1 year recommended Screening Labs: ordered   reports that she has never smoked. She has never used smokeless tobacco. She reports current alcohol use. She reports that she does not use drugs.  Past Medical History:  Diagnosis Date   Abnormal Pap smear, atypical squamous cells of undetermined sign (ASC-US) 10/98   CIN 1   Allergy    Asthma    Atypical nevus 07/29/2010   Right Medial Buttock-Moderate   Atypical nevus x 2 11/15/2006   Upper Right Back-Moderate and Lower Mid Back-Moderate to Marked (w/s)   Atypical nevus x 3 11/04/2007   Right Side-Slight to Moderate, Right Upper Side-Moderate, and Left Upper Breast-Moderate   Atypical nevus x 7 10/15/2006   Right Medial Hip-Moderate, Right Middle Hip-Slight, Right Lateral Hip-Mild, Right Axillary Vault-Moderate(w/s), Right Post Calf-Moderate(w/s),Right Inner Buttock-Slight, and Left Mid Back-Moderate(w/s)   Condyloma acuminata 2001   Endometriosis 8/95   DepoLupron 4/96-10/96   Hiatal hernia    History of campylobacteriosis    HSV-2 infection     Hyperlipidemia    IBS (irritable bowel syndrome)    Internal thrombosed hemorrhoids    Migraine    without aura   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 90 capsule 3   valACYclovir (VALTREX) 1000 MG tablet TAKE 1 TABLET BY MOUTH DAILY. INCREASED TO TWICE DAILY FOR 3 DAYS WITH SYMPTOMS. 90 tablet 1   No current facility-administered medications for this visit.    Family History  Problem Relation Age of Onset   Irritable bowel syndrome Mother    Hernia Mother    Ulcers Maternal Grandfather        gastrectomy   Leukemia Maternal Grandfather    Diabetes Maternal Grandfather    Prostate cancer Maternal Grandfather    Diabetes Maternal Grandmother    Coronary artery disease Maternal Grandmother    Stroke Maternal Grandmother    Heart failure Maternal Grandmother    Colon cancer Neg Hx  Esophageal cancer Neg Hx    Liver disease Neg Hx    Pancreatic cancer Neg Hx    Stomach cancer Neg Hx     Review of Systems  Genitourinary:  Positive for menstrual problem.  All other systems reviewed and are negative.  Exam:   BP 136/85   Pulse 80   Ht 5' 7.25" (1.708 m)   Wt 172 lb (78 kg)   LMP 06/08/2021 (Exact Date)   BMI 26.74 kg/m   Height: 5' 7.25" (170.8 cm)  General appearance: alert, cooperative and appears stated age Head: Normocephalic, without obvious abnormality, atraumatic Neck: no adenopathy, supple, symmetrical, trachea midline and thyroid normal to inspection and palpation Lungs: clear to auscultation  bilaterally Breasts: normal appearance, no masses or tenderness Heart: regular rate and rhythm Abdomen: soft, non-tender; bowel sounds normal; no masses,  no organomegaly Extremities: extremities normal, atraumatic, no cyanosis or edema Skin: Skin color, texture, turgor normal. No rashes or lesions Lymph nodes: Cervical, supraclavicular, and axillary nodes normal. No abnormal inguinal nodes palpated Neurologic: Grossly normal   Pelvic: External genitalia:  no lesions              Urethra:  normal appearing urethra with no masses, tenderness or lesions              Bartholins and Skenes: normal                 Vagina: normal appearing vagina with normal color and no discharge, no lesions              Cervix: no lesions              Pap taken: No. Bimanual Exam:  Uterus:  normal size, contour, position, consistency, mobility, non-tender              Adnexa: no mass, fullness, tenderness               Rectovaginal: Confirms               Anus:  normal sphincter tone, no lesions  Chaperone, Octaviano Batty, CMA, was present for exam.  Assessment/Plan: 1. Well woman exam with routine gynecological exam - pap smear neg with neg HR HPV 2021 - MMG due and order placed - colonoscopy due.  Pt aware.  Was done last year with 1 year recommendation - Care gaps reviewed/updated  2. Blood tests for routine general physical examination - CBC - Comprehensive metabolic panel - Lipid panel - TSH  3. HSV-2 infection - valACYclovir (VALTREX) 1000 MG tablet; Take 1 bid x 3 days with symptom onset  Dispense: 30 tablet; Refill: 2  4. Encounter for screening mammogram for malignant neoplasm of breast - MM 3D SCREEN BREAST BILATERAL; Future  5. Irregular bleeding - Follicle stimulating hormone  6. Menorrhagia with irregular cycle - considering endometrial ablation.  Ultrasound and endometrial biopsy recommended.  Pt will wait to schedule until knows about benefits for next year.

## 2021-06-20 ENCOUNTER — Encounter (HOSPITAL_BASED_OUTPATIENT_CLINIC_OR_DEPARTMENT_OTHER): Payer: Self-pay

## 2021-06-20 LAB — COMPREHENSIVE METABOLIC PANEL
ALT: 20 IU/L (ref 0–32)
AST: 24 IU/L (ref 0–40)
Albumin/Globulin Ratio: 1.8 (ref 1.2–2.2)
Albumin: 5 g/dL — ABNORMAL HIGH (ref 3.8–4.9)
Alkaline Phosphatase: 63 IU/L (ref 44–121)
BUN/Creatinine Ratio: 17 (ref 9–23)
BUN: 12 mg/dL (ref 6–24)
Bilirubin Total: 0.7 mg/dL (ref 0.0–1.2)
CO2: 22 mmol/L (ref 20–29)
Calcium: 9.8 mg/dL (ref 8.7–10.2)
Chloride: 99 mmol/L (ref 96–106)
Creatinine, Ser: 0.72 mg/dL (ref 0.57–1.00)
Globulin, Total: 2.8 g/dL (ref 1.5–4.5)
Glucose: 78 mg/dL (ref 70–99)
Potassium: 4.4 mmol/L (ref 3.5–5.2)
Sodium: 139 mmol/L (ref 134–144)
Total Protein: 7.8 g/dL (ref 6.0–8.5)
eGFR: 101 mL/min/{1.73_m2} (ref >59)

## 2021-06-20 LAB — LIPID PANEL
Chol/HDL Ratio: 3.5 ratio (ref 0.0–4.4)
Cholesterol, Total: 323 mg/dL — ABNORMAL HIGH (ref 100–199)
HDL: 93 mg/dL (ref >39)
LDL Chol Calc (NIH): 221 mg/dL — ABNORMAL HIGH (ref 0–99)
Triglycerides: 65 mg/dL (ref 0–149)
VLDL Cholesterol Cal: 9 mg/dL (ref 5–40)

## 2021-06-20 LAB — FOLLICLE STIMULATING HORMONE: FSH: 50.6 m[IU]/mL

## 2021-06-20 LAB — CBC
Hematocrit: 44.4 % (ref 34.0–46.6)
Hemoglobin: 15.1 g/dL (ref 11.1–15.9)
MCH: 33.6 pg — ABNORMAL HIGH (ref 26.6–33.0)
MCHC: 34 g/dL (ref 31.5–35.7)
MCV: 99 fL — ABNORMAL HIGH (ref 79–97)
Platelets: 343 10*3/uL (ref 150–450)
RBC: 4.5 x10E6/uL (ref 3.77–5.28)
RDW: 11.9 % (ref 11.7–15.4)
WBC: 6.4 10*3/uL (ref 3.4–10.8)

## 2021-06-20 LAB — TSH: TSH: 2.75 u[IU]/mL (ref 0.450–4.500)

## 2021-06-23 ENCOUNTER — Telehealth: Payer: Self-pay | Admitting: Family Medicine

## 2021-06-23 NOTE — Telephone Encounter (Signed)
Recent labs are in Epic

## 2021-06-23 NOTE — Telephone Encounter (Signed)
Pt stated obgyn advised her to call her primary because her cholesterol is high and she might need medication. She stated she was unsure if she needs an appt since she just had blood work done. Please advise.

## 2021-06-24 ENCOUNTER — Telehealth: Payer: Self-pay

## 2021-06-24 MED ORDER — LINACLOTIDE 72 MCG PO CAPS: 72.0000 ug | ORAL_CAPSULE | Freq: Every day | ORAL | 0 refills | Status: DC

## 2021-06-24 NOTE — Telephone Encounter (Signed)
Linzess 67mcg samples requested. Patient cannot afford rx for this. Mom here for a visit and will take them to her. Approved by Dr Silverio Decamp.

## 2021-06-25 NOTE — Telephone Encounter (Signed)
Pt has appointment on 11/22

## 2021-06-27 ENCOUNTER — Ambulatory Visit: Payer: No Typology Code available for payment source | Admitting: Family Medicine

## 2021-06-30 ENCOUNTER — Other Ambulatory Visit: Payer: Self-pay

## 2021-06-30 ENCOUNTER — Ambulatory Visit (HOSPITAL_BASED_OUTPATIENT_CLINIC_OR_DEPARTMENT_OTHER)
Admission: RE | Admit: 2021-06-30 | Discharge: 2021-06-30 | Disposition: A | Discharge status: Home / Self Care | Payer: No Typology Code available for payment source | Source: Ambulatory Visit | Attending: Obstetrics & Gynecology | Admitting: Obstetrics & Gynecology

## 2021-06-30 DIAGNOSIS — Z1231 Encounter for screening mammogram for malignant neoplasm of breast: Secondary | ICD-10-CM | POA: Diagnosis not present

## 2021-07-01 ENCOUNTER — Ambulatory Visit (INDEPENDENT_AMBULATORY_CARE_PROVIDER_SITE_OTHER): Payer: No Typology Code available for payment source | Admitting: Family Medicine

## 2021-07-01 ENCOUNTER — Encounter: Payer: Self-pay | Admitting: Family Medicine

## 2021-07-01 VITALS — BP 124/88 | HR 89 | Temp 98.0°F | Resp 18 | Ht 67.25 in | Wt 177.0 lb

## 2021-07-01 DIAGNOSIS — J4520 Mild intermittent asthma, uncomplicated: Secondary | ICD-10-CM | POA: Diagnosis not present

## 2021-07-01 DIAGNOSIS — E663 Overweight: Secondary | ICD-10-CM | POA: Insufficient documentation

## 2021-07-01 DIAGNOSIS — J4530 Mild persistent asthma, uncomplicated: Secondary | ICD-10-CM | POA: Diagnosis not present

## 2021-07-01 DIAGNOSIS — E785 Hyperlipidemia, unspecified: Secondary | ICD-10-CM

## 2021-07-01 DIAGNOSIS — Z6827 Body mass index (BMI) 27.0-27.9, adult: Secondary | ICD-10-CM | POA: Insufficient documentation

## 2021-07-01 MED ORDER — SAXENDA 18 MG/3ML ~~LOC~~ SOPN: 0.6000 mg | PEN_INJECTOR | Freq: Every day | SUBCUTANEOUS | 2 refills | Status: DC

## 2021-07-01 NOTE — Patient Instructions (Signed)
Cholesterol Content in Foods ?Cholesterol is a waxy, fat-like substance that helps to carry fat in the blood. The body needs cholesterol in small amounts, but too much cholesterol can cause damage to the arteries and heart. ?What foods have cholesterol? ?Cholesterol is found in animal-based foods, such as meat, seafood, and dairy. Generally, low-fat dairy and lean meats have less cholesterol than full-fat dairy and fatty meats. The milligrams of cholesterol per serving (mg per serving) of common cholesterol-containing foods are listed below. ?Meats and other proteins ?Egg -- one large whole egg has 186 mg. ?Veal shank -- 4 oz (113 g) has 141 mg. ?Lean ground turkey (93% lean) -- 4 oz (113 g) has 118 mg. ?Fat-trimmed lamb loin -- 4 oz (113 g) has 106 mg. ?Lean ground beef (90% lean) -- 4 oz (113 g) has 100 mg. ?Lobster -- 3.5 oz (99 g) has 90 mg. ?Pork loin chops -- 4 oz (113 g) has 86 mg. ?Canned salmon -- 3.5 oz (99 g) has 83 mg. ?Fat-trimmed beef top loin -- 4 oz (113 g) has 78 mg. ?Frankfurter -- 1 frank (3.5 oz or 99 g) has 77 mg. ?Crab -- 3.5 oz (99 g) has 71 mg. ?Roasted chicken without skin, white meat -- 4 oz (113 g) has 66 mg. ?Light bologna -- 2 oz (57 g) has 45 mg. ?Deli-cut turkey -- 2 oz (57 g) has 31 mg. ?Canned tuna -- 3.5 oz (99 g) has 31 mg. ?Bacon -- 1 oz (28 g) has 29 mg. ?Oysters and mussels (raw) -- 3.5 oz (99 g) has 25 mg. ?Mackerel -- 1 oz (28 g) has 22 mg. ?Trout -- 1 oz (28 g) has 20 mg. ?Pork sausage -- 1 link (1 oz or 28 g) has 17 mg. ?Salmon -- 1 oz (28 g) has 16 mg. ?Tilapia -- 1 oz (28 g) has 14 mg. ?Dairy ?Soft-serve ice cream -- ? cup (4 oz or 86 g) has 103 mg. ?Whole-milk yogurt -- 1 cup (8 oz or 245 g) has 29 mg. ?Cheddar cheese -- 1 oz (28 g) has 28 mg. ?American cheese -- 1 oz (28 g) has 28 mg. ?Whole milk -- 1 cup (8 oz or 250 mL) has 23 mg. ?2% milk -- 1 cup (8 oz or 250 mL) has 18 mg. ?Cream cheese -- 1 tablespoon (Tbsp) (14.5 g) has 15 mg. ?Cottage cheese -- ? cup (4 oz or 113  g) has 14 mg. ?Low-fat (1%) milk -- 1 cup (8 oz or 250 mL) has 10 mg. ?Sour cream -- 1 Tbsp (12 g) has 8.5 mg. ?Low-fat yogurt -- 1 cup (8 oz or 245 g) has 8 mg. ?Nonfat Greek yogurt -- 1 cup (8 oz or 228 g) has 7 mg. ?Half-and-half cream -- 1 Tbsp (15 mL) has 5 mg. ?Fats and oils ?Cod liver oil -- 1 tablespoon (Tbsp) (13.6 g) has 82 mg. ?Butter -- 1 Tbsp (14 g) has 15 mg. ?Lard -- 1 Tbsp (12.8 g) has 14 mg. ?Bacon grease -- 1 Tbsp (12.9 g) has 14 mg. ?Mayonnaise -- 1 Tbsp (13.8 g) has 5-10 mg. ?Margarine -- 1 Tbsp (14 g) has 3-10 mg. ?The items listed above may not be a complete list of foods with cholesterol. Exact amounts of cholesterol in these foods may vary depending on specific ingredients and brands. Contact a dietitian for more information. ?What foods do not have cholesterol? ?Most plant-based foods do not have cholesterol unless you combine them with a food that has cholesterol.   Foods without cholesterol include: ?Grains and cereals. ?Vegetables. ?Fruits. ?Vegetable oils, such as olive, canola, and sunflower oil. ?Legumes, such as peas, beans, and lentils. ?Nuts and seeds. ?Egg whites. ?The items listed above may not be a complete list of foods that do not have cholesterol. Contact a dietitian for more information. ?Summary ?The body needs cholesterol in small amounts, but too much cholesterol can cause damage to the arteries and heart. ?Cholesterol is found in animal-based foods, such as meat, seafood, and dairy. Generally, low-fat dairy and lean meats have less cholesterol than full-fat dairy and fatty meats. ?This information is not intended to replace advice given to you by your health care provider. Make sure you discuss any questions you have with your health care provider. ?Document Revised: 12/06/2020 Document Reviewed: 12/06/2020 ?Elsevier Patient Education ? 2022 [employer] Inc. ? ?

## 2021-07-01 NOTE — Assessment & Plan Note (Signed)
Encourage heart healthy diet such as MIND or DASH diet, increase exercise, avoid trans fats, simple carbohydrates and processed foods, consider a krill or fish or flaxseed oil cap daily.  We will recheck cholesterol in 3 months

## 2021-07-01 NOTE — Progress Notes (Signed)
Subjective:   By signing my name below, I, Shehryar Baig, attest that this documentation has been prepared under the direction and in the presence of Dr. Roma Schanz, DO. 07/01/2021    Patient ID: [name] [name], female    DOB: 21-Apr-1971, 50 y.o.   MRN: [id number]  Chief Complaint  Patient presents with  . Hyperlipidemia    Pt here to discuss labs that were drawn at GYN.     Hyperlipidemia Pertinent negatives include no chest pain or shortness of breath.   Patient is in today for a office visit.   Cholesterol- Her cholesterol is elevated during her last lipid panel.  Lab Results  Component Value Date   CHOL 323 (H) 06/19/2021   HDL 93 06/19/2021   LDLCALC 221 (H) 06/19/2021   TRIG 65 06/19/2021   CHOLHDL 3.5 06/19/2021   Progesterone- She reports being on progesterone and experiencing insomnia while on it.  Weight loss- She is interested in trying weight loss medication to assist with her weight loss. She would also like to control her cholesterol with weight loss.  Exercise- She continues going to the gym to exercise. She stopped going as frequently as before due to the gym closing earlier. She is walking more frequently for exercise.    Past Medical History:  Diagnosis Date  . Abnormal Pap smear, atypical squamous cells of undetermined sign (ASC-US) 10/98   CIN 1  . Allergy   . Asthma   . Atypical nevus 07/29/2010   Right Medial Buttock-Moderate  . Atypical nevus x 2 11/15/2006   Upper Right Back-Moderate and Lower Mid Back-Moderate to Marked (w/s)  . Atypical nevus x 3 11/04/2007   Right Side-Slight to Moderate, Right Upper Side-Moderate, and Left Upper Breast-Moderate  . Atypical nevus x 7 10/15/2006   Right Medial Hip-Moderate, Right Middle Hip-Slight, Right Lateral Hip-Mild, Right Axillary Vault-Moderate(w/s), Right Post Calf-Moderate(w/s),Right Inner Buttock-Slight, and Left Mid Back-Moderate(w/s)  . Condyloma acuminata 2001  . Endometriosis 8/95    DepoLupron 4/96-10/96  . Hiatal hernia   . History of campylobacteriosis   . HSV-2 infection   . Hyperlipidemia   . IBS (irritable bowel syndrome)   . Internal thrombosed hemorrhoids   . Migraine    without aura  . 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 AUGMENTATION MAMMAPLASTY Bilateral   . BREAST BIOPSY Left    benign  . BREAST ENHANCEMENT SURGERY  02/2000  . BREAST MASS EXCISION Right 12/01   fibroadenoma  . EXPLORATORY LAPAROTOMY  8/95   endometriosis    Family History  Problem Relation Age of Onset  . Irritable bowel syndrome Mother   . Hernia Mother   . Ulcers Maternal Grandfather        gastrectomy  . Leukemia Maternal Grandfather   . Diabetes Maternal Grandfather   . Prostate cancer Maternal Grandfather   . Diabetes Maternal Grandmother   . Coronary artery disease Maternal Grandmother   . Stroke Maternal Grandmother   . Heart failure Maternal Grandmother   . Colon cancer Neg Hx   . Esophageal cancer Neg Hx   . Liver disease Neg Hx   . Pancreatic cancer Neg Hx   . Stomach cancer Neg Hx     Social History   Socioeconomic History  . Marital status: Married    Spouse name: Not on file  . Number of children: 0  .  Years of education: Not on file  . Highest education level: Not on file  Occupational History    Employer: [employer]  Tobacco Use  . Smoking status: Never  . Smokeless tobacco: Never  Vaping Use  . Vaping Use: Never used  Substance and Sexual Activity  . Alcohol use: Yes    Comment: occasionally  . Drug use: No  . Sexual activity: Yes    Birth control/protection: None  Other Topics Concern  . Not on file  Social History Narrative   Currently unemployed --- looking for a job    Social Determinants of Health   Financial Resource Strain: Not on file  Food Insecurity: Not on file  Transportation Needs: Not on file  Physical  Activity: Not on file  Stress: Not on file  Social Connections: Not on file  Intimate Partner Violence: Not on file    Outpatient Medications Prior to Visit  Medication Sig Dispense Refill  . lansoprazole (PREVACID) 30 MG capsule Take 1 capsule (30 mg total) by mouth daily at 12 noon. 90 capsule 3  . linaclotide (LINZESS) 72 MCG capsule Take 1 capsule (72 mcg total) by mouth daily before breakfast. 24 capsule 0  . norethindrone (AYGESTIN) 5 MG tablet Take 1 tablet (5 mg total) by mouth in the morning and at bedtime. 60 tablet 0  . valACYclovir (VALTREX) 1000 MG tablet Take 1 bid x 3 days with symptom onset 30 tablet 2   No facility-administered medications prior to visit.    Allergies  Allergen Reactions  . Aspirin     REACTION: gi upset  . Doxycycline Nausea And Vomiting  . Latex   . Sulfonamide Derivatives     Review of Systems  Constitutional:  Negative for fever and malaise/fatigue.  HENT:  Negative for congestion.   Eyes:  Negative for blurred vision.  Respiratory:  Negative for cough and shortness of breath.   Cardiovascular:  Negative for chest pain, palpitations and leg swelling.  Gastrointestinal:  Negative for vomiting.  Musculoskeletal:  Negative for back pain.  Skin:  Negative for rash.  Neurological:  Negative for loss of consciousness and headaches.      Objective:    Physical Exam Vitals and nursing note reviewed.  Constitutional:      General: She is not in acute distress.    Appearance: Normal appearance. She is well-developed. She is not ill-appearing.  HENT:     Head: Normocephalic and atraumatic.     Right Ear: External ear normal.     Left Ear: External ear normal.  Eyes:     Extraocular Movements: Extraocular movements intact.     Conjunctiva/sclera: Conjunctivae normal.     Pupils: Pupils are equal, round, and reactive to light.  Neck:     Thyroid: No thyromegaly.     Vascular: No carotid bruit or JVD.  Cardiovascular:     Rate and  Rhythm: Normal rate and regular rhythm.     Heart sounds: Normal heart sounds. No murmur heard.   No gallop.  Pulmonary:     Effort: Pulmonary effort is normal. No respiratory distress.     Breath sounds: Normal breath sounds. No wheezing or rales.  Chest:     Chest wall: No tenderness.  Musculoskeletal:     Cervical back: Normal range of motion and neck supple.  Skin:    General: Skin is warm and dry.  Neurological:     Mental Status: She is alert and oriented to person, place,  and time.  Psychiatric:        Behavior: Behavior normal.        Judgment: Judgment normal.    BP 124/88 (BP Location: Left Arm, Patient Position: Sitting, Cuff Size: Normal)   Pulse 89   Temp 98 F (36.7 C) (Oral)   Resp 18   Ht 5' 7.25" (1.708 m)   Wt 177 lb (80.3 kg)   LMP 06/08/2021 (Exact Date)   SpO2 100%   BMI 27.52 kg/m  Wt Readings from Last 3 Encounters:  07/01/21 177 lb (80.3 kg)  06/19/21 172 lb (78 kg)  02/06/21 172 lb 9.6 oz (78.3 kg)    Diabetic Foot Exam - Simple   No data filed    Lab Results  Component Value Date   WBC 6.4 06/19/2021   HGB 15.1 06/19/2021   HCT 44.4 06/19/2021   PLT 343 06/19/2021   GLUCOSE 78 06/19/2021   CHOL 323 (H) 06/19/2021   TRIG 65 06/19/2021   HDL 93 06/19/2021   LDLCALC 221 (H) 06/19/2021   ALT 20 06/19/2021   AST 24 06/19/2021   NA 139 06/19/2021   K 4.4 06/19/2021   CL 99 06/19/2021   CREATININE 0.72 06/19/2021   BUN 12 06/19/2021   CO2 22 06/19/2021   TSH 2.750 06/19/2021   HGBA1C 5.2 03/05/2020    Lab Results  Component Value Date   TSH 2.750 06/19/2021   Lab Results  Component Value Date   WBC 6.4 06/19/2021   HGB 15.1 06/19/2021   HCT 44.4 06/19/2021   MCV 99 (H) 06/19/2021   PLT 343 06/19/2021   Lab Results  Component Value Date   NA 139 06/19/2021   K 4.4 06/19/2021   CO2 22 06/19/2021   GLUCOSE 78 06/19/2021   BUN 12 06/19/2021   CREATININE 0.72 06/19/2021   BILITOT 0.7 06/19/2021   ALKPHOS 63 06/19/2021    AST 24 06/19/2021   ALT 20 06/19/2021   PROT 7.8 06/19/2021   ALBUMIN 5.0 (H) 06/19/2021   CALCIUM 9.8 06/19/2021   ANIONGAP 12 04/19/2019   EGFR 101 06/19/2021   GFR 83.11 06/02/2019   Lab Results  Component Value Date   CHOL 323 (H) 06/19/2021   Lab Results  Component Value Date   HDL 93 06/19/2021   Lab Results  Component Value Date   LDLCALC 221 (H) 06/19/2021   Lab Results  Component Value Date   TRIG 65 06/19/2021   Lab Results  Component Value Date   CHOLHDL 3.5 06/19/2021   Lab Results  Component Value Date   HGBA1C 5.2 03/05/2020       Assessment & Plan:   Problem List Items Addressed This Visit       Unprioritized   Hyperlipidemia   Relevant Orders   Lipid panel   Comprehensive metabolic panel   Mild intermittent asthma    Stable       Mild persistent asthma without complication   Relevant Medications   Liraglutide -Weight Management (SAXENDA) 18 MG/3ML SOPN   Overweight with body mass index (BMI) of 27 to 27.9 in adult - Primary    saxenda daily  Cont diet and exercise Pt instructed on how to use syringe      Relevant Medications   Liraglutide -Weight Management (SAXENDA) 18 MG/3ML SOPN     Meds ordered this encounter  Medications  . Liraglutide -Weight Management (SAXENDA) 18 MG/3ML SOPN    Sig: Inject 0.6 mg into the skin daily.  Dispense:  3 mL    Refill:  2    I, Dr. Roma Schanz, DO, personally preformed the services described in this documentation.  All medical record entries made by the scribe were at my direction and in my presence.  I have reviewed the chart and discharge instructions (if applicable) and agree that the record reflects my personal performance and is accurate and complete. 07/01/2021   I,Shehryar Baig,acting as a scribe for Ann Held, DO.,have documented all relevant documentation on the behalf of Ann Held, DO,as directed by  Ann Held, DO while in the presence of  Ann Held, DO.   Ann Held, DO

## 2021-07-01 NOTE — Assessment & Plan Note (Signed)
saxenda daily  Cont diet and exercise Pt instructed on how to use syringe

## 2021-07-01 NOTE — Assessment & Plan Note (Signed)
Stable

## 2021-07-02 ENCOUNTER — Encounter: Payer: Self-pay | Admitting: Family Medicine

## 2021-07-02 ENCOUNTER — Telehealth: Payer: Self-pay | Admitting: Gastroenterology

## 2021-07-02 NOTE — Telephone Encounter (Signed)
Patient called stating that her Insurance said she needs pre authorization for her Linzess. Please Advise.

## 2021-07-02 NOTE — Telephone Encounter (Signed)
Prior authorization done today through Cover My Meds. Called [facility] and got pts prescription information. Never received a fax for her.. Waiting on a response Called patient to inform

## 2021-07-07 ENCOUNTER — Telehealth (HOSPITAL_BASED_OUTPATIENT_CLINIC_OR_DEPARTMENT_OTHER): Payer: Self-pay

## 2021-07-07 NOTE — Telephone Encounter (Signed)
FYI .Marland KitchenMarland KitchenMarland KitchenAuthorization approved Linzess 72 mcg  called patient to inform, She stated that she thought the Linzess 83mcg is not working now.. I told her to try and take 2 of the 56mcg's  If that works we can send in a new script for 145 mcg. She will call back and let us know... I also told her to take the medication in the morning before breakfast with a glass of water

## 2021-07-07 NOTE — Telephone Encounter (Signed)
Patient called today to let us know that she has stopped taking the progesterone. She states that she was having a lot of side effects and just felt like she could not continue on. She said she had already started lowering the dose to see if it would get better. She was taking one a day. The progesterone was causing her to have severe leg cramps. She found herself getting up in the middle of the night in lots of pain. She was experiencing migraine headaches as well as noticeable swelling in her feet. The day that she stopped the medication, she noticed her arms starting to swell.  Please advise.    Per Dr. Sabra Heck, I called patient to let her know that it was ok that she stopped the progesterone. Dr. Sabra Heck wanted to know if she was bleeding any and if she wanted to go forward with the ablasion. It was talked about at her office visit and patient stated she wanted to check with her insurance before making a decision. tbw

## 2021-07-07 NOTE — Telephone Encounter (Signed)
Ok thanks 

## 2021-07-08 NOTE — Telephone Encounter (Signed)
Called and spoke with patient again today. Patient states she is not bleeding yet, but has only been off medication for a couple days. Patient states that she is really not sure about what was discussed about the ablation, but will keep ultrasound and endometrial biopsy appointment. If there is something to discuss she will discuss at that appointment. tbw

## 2021-07-16 ENCOUNTER — Other Ambulatory Visit: Payer: Self-pay

## 2021-07-16 ENCOUNTER — Other Ambulatory Visit (HOSPITAL_COMMUNITY)
Admission: RE | Admit: 2021-07-16 | Discharge: 2021-07-16 | Disposition: A | Discharge status: Home / Self Care | Payer: No Typology Code available for payment source | Source: Ambulatory Visit | Attending: Obstetrics & Gynecology | Admitting: Obstetrics & Gynecology

## 2021-07-16 ENCOUNTER — Ambulatory Visit (INDEPENDENT_AMBULATORY_CARE_PROVIDER_SITE_OTHER): Payer: No Typology Code available for payment source | Admitting: Obstetrics & Gynecology

## 2021-07-16 ENCOUNTER — Telehealth: Payer: Self-pay | Admitting: Family Medicine

## 2021-07-16 ENCOUNTER — Encounter (HOSPITAL_BASED_OUTPATIENT_CLINIC_OR_DEPARTMENT_OTHER): Payer: Self-pay | Admitting: Obstetrics & Gynecology

## 2021-07-16 ENCOUNTER — Ambulatory Visit (INDEPENDENT_AMBULATORY_CARE_PROVIDER_SITE_OTHER): Payer: No Typology Code available for payment source

## 2021-07-16 VITALS — BP 131/85 | HR 98 | Ht 68.0 in | Wt 170.0 lb

## 2021-07-16 DIAGNOSIS — N921 Excessive and frequent menstruation with irregular cycle: Secondary | ICD-10-CM | POA: Insufficient documentation

## 2021-07-16 DIAGNOSIS — D251 Intramural leiomyoma of uterus: Secondary | ICD-10-CM

## 2021-07-16 NOTE — Telephone Encounter (Signed)
Pt. Called and stated she wanted to see about getting about sample of the medication below. She stated that if she saw progress with the medication she would be willing to pay out of pocket for the medication. She doesn't want to spend $1500 on the medicine if it doesn't work. She said she was informed that she couldn't get another sample by a CMA but she wants to see Dr.Lownes opinion first.   Liraglutide -Weight Management ([place]ascade) 18 MG/3ML SOPN

## 2021-07-16 NOTE — Telephone Encounter (Signed)
We currently do not have any samples

## 2021-07-17 NOTE — Progress Notes (Signed)
GYNECOLOGY  VISIT  CC:   irregular bleeding  HPI: 50 y.o. G0P0000 Married White or Caucasian female here for discussion of ultrasound results, endometrial biopsy and to discuss treatment options.  Pt has stopped bleeding (six days ago) but she did not like the way the progesterone made her feel so she stopped it.  She has not done well with hormonal therapy in the past and really does not want to take any future hormones for bleeding treatment if possible.    Ultrasound reviewed.  Pt aware intramural fibroid is present but that she does not need any specific treatment for this.  She is not interested in an IUD.  She is not interested in a hysterectomy.  We did discuss endometrial ablation at last visit and again today.  She is interested in this.  Thinks this is covered by her insurance but not interested in proceeding until after the holidays.  Procedure reviewed with pt including risks.  She is aware she will need anesthesia for this.  Also aware it is not recommended to proceed with subsequent pregnancy.  Has never been pregnant in the past despite trying.  Does not want permanent contraceptive procedure but does understand this is recommended.  Wants the shortest recovery possible.  This was discussed as well.    GYNECOLOGIC HISTORY: No LMP recorded. Contraception: none   Patient Active Problem List   Diagnosis Date Noted   Overweight with body mass index (BMI) of 27 to 27.9 in adult 07/01/2021   Mild persistent asthma without complication 79/89/2119   Hyperlipidemia 07/01/2021   Mild intermittent asthma 03/19/2020   Overweight (BMI 25.0-29.9) 03/19/2020   Preventative health care 06/02/2019   Weight gain 06/02/2019   Nausea 06/02/2019   Adenomyosis 05/19/2019   INFECTIOUS COLITIS ENTERITIS AND GASTROENTERITIS 05/14/2009   Slow transit constipation 05/14/2009   IRRITABLE BOWEL SYNDROME 05/14/2009   INTERNAL THROMBOSED HEMORRHOIDS 12/31/2007    Past Medical History:  Diagnosis Date    Abnormal Pap smear, atypical squamous cells of undetermined sign (ASC-US) 10/98   CIN 1   Allergy    Asthma    Atypical nevus 07/29/2010   Right Medial Buttock-Moderate   Atypical nevus x 2 11/15/2006   Upper Right Back-Moderate and Lower Mid Back-Moderate to Marked (w/s)   Atypical nevus x 3 11/04/2007   Right Side-Slight to Moderate, Right Upper Side-Moderate, and Left Upper Breast-Moderate   Atypical nevus x 7 10/15/2006   Right Medial Hip-Moderate, Right Middle Hip-Slight, Right Lateral Hip-Mild, Right Axillary Vault-Moderate(w/s), Right Post Calf-Moderate(w/s),Right Inner Buttock-Slight, and Left Mid Back-Moderate(w/s)   Condyloma acuminata 2001   Endometriosis 8/95   DepoLupron 4/96-10/96   Hiatal hernia    History of campylobacteriosis    HSV-2 infection    Hyperlipidemia    IBS (irritable bowel syndrome)    Internal thrombosed hemorrhoids    Migraine    without aura   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 [place]  capsule 3   linaclotide (LINZESS) 72 MCG capsule Take 1 capsule (72 mcg total) by mouth daily before breakfast. 24 capsule 0   Liraglutide -Weight Management (SAXENDA) 18 MG/3ML SOPN Inject 0.6 mg into the skin daily. 3 mL 2   norethindrone (AYGESTIN) 5 MG tablet Take 1 tablet (5 mg total) by mouth in the morning and at bedtime. 60 tablet 0   valACYclovir (VALTREX) 1000 MG tablet Take 1 bid x 3 days with symptom onset 30 tablet 2   No current facility-administered  medications on file prior to visit.    ALLERGIES: Aspirin, Doxycycline, Latex, and Sulfonamide derivatives  Family History  Problem Relation Age of Onset   Irritable bowel syndrome Mother    Hernia Mother    Ulcers Maternal Grandfather        gastrectomy   Leukemia Maternal Grandfather    Diabetes Maternal Grandfather    Prostate cancer Maternal Grandfather    Diabetes Maternal Grandmother    Coronary artery disease Maternal Grandmother    Stroke Maternal Grandmother    Heart failure Maternal Grandmother    Colon cancer Neg Hx    Esophageal cancer Neg Hx    Liver disease Neg Hx    Pancreatic cancer Neg Hx    Stomach cancer Neg Hx     SH:  married, non smoker  Review of Systems  All other systems reviewed and are negative.  PHYSICAL EXAMINATION:    BP 131/85 (BP Location: Left Arm, Patient Position: Sitting, Cuff Size: Large)   Pulse 98   Ht 5\' 8"  (1.727 m) Comment: reported  Wt 170 lb (77.1 kg) Comment: reported  BMI 25.85 kg/m     General appearance: alert, cooperative and appears stated age Lymph:  no inguinal LAD noted  Pelvic: External genitalia:  no lesions              Urethra:  normal appearing urethra with no masses, tenderness or lesions              Bartholins and Skenes: normal                 Vagina: normal appearing vagina with normal color and discharge, no lesions              Cervix: no lesions              Bimanual Exam:  Uterus:  normal size, contour, position, consistency, mobility, non-tender              Adnexa: no mass, fullness, tenderness               Endometrial biopsy recommended.  Discussed with patient.  Verbal and written consent obtained.   Procedure:  Speculum placed.  Cervix visualized and cleansed with betadine prep.  A single toothed tenaculum was applied to the anterior lip of the cervix.  Endometrial pipelle was advanced through the cervix into the endometrial cavity without difficulty.  Pipelle passed to 7.5cm.  Suction applied  and pipelle removed with good tissue sample obtained.  Tenculum removed.  No bleeding noted.  Patient tolerated procedure well.  Chaperone, Octaviano Batty, CMA, was present for exam.  Assessment/Plan: 1. Menorrhagia with irregular cycle - Surgical pathology( [facility] Ch[facility]/ [facility]) - considering endometrial ablation for treatment - declines any hormonal therapy or IUD at this time

## 2021-07-18 LAB — SURGICAL PATHOLOGY

## 2021-07-18 NOTE — Telephone Encounter (Signed)
Emailed drug rep for samples.

## 2021-07-19 ENCOUNTER — Other Ambulatory Visit: Payer: Self-pay

## 2021-07-19 ENCOUNTER — Emergency Department (HOSPITAL_BASED_OUTPATIENT_CLINIC_OR_DEPARTMENT_OTHER): Payer: PRIVATE HEALTH INSURANCE

## 2021-07-19 ENCOUNTER — Encounter (HOSPITAL_BASED_OUTPATIENT_CLINIC_OR_DEPARTMENT_OTHER): Payer: Self-pay | Admitting: Emergency Medicine

## 2021-07-19 ENCOUNTER — Telehealth: Payer: Self-pay | Admitting: Gastroenterology

## 2021-07-19 ENCOUNTER — Emergency Department (HOSPITAL_BASED_OUTPATIENT_CLINIC_OR_DEPARTMENT_OTHER)
Admission: EM | Admit: 2021-07-19 | Discharge: 2021-07-19 | Disposition: A | Discharge status: Home / Self Care | Payer: PRIVATE HEALTH INSURANCE | Attending: Emergency Medicine | Admitting: Emergency Medicine

## 2021-07-19 DIAGNOSIS — Z91040 Latex allergy status: Secondary | ICD-10-CM | POA: Insufficient documentation

## 2021-07-19 DIAGNOSIS — K6289 Other specified diseases of anus and rectum: Secondary | ICD-10-CM | POA: Diagnosis present

## 2021-07-19 DIAGNOSIS — Z85828 Personal history of other malignant neoplasm of skin: Secondary | ICD-10-CM | POA: Insufficient documentation

## 2021-07-19 DIAGNOSIS — J4530 Mild persistent asthma, uncomplicated: Secondary | ICD-10-CM | POA: Diagnosis not present

## 2021-07-19 DIAGNOSIS — K5900 Constipation, unspecified: Secondary | ICD-10-CM | POA: Insufficient documentation

## 2021-07-19 LAB — CBC
HCT: 45.4 % (ref 36.0–46.0)
Hemoglobin: 15.4 g/dL — ABNORMAL HIGH (ref 12.0–15.0)
MCH: 33.6 pg (ref 26.0–34.0)
MCHC: 33.9 g/dL (ref 30.0–36.0)
MCV: 99.1 fL (ref 80.0–100.0)
Platelets: 299 10*3/uL (ref 150–400)
RBC: 4.58 MIL/uL (ref 3.87–5.11)
RDW: 12.4 % (ref 11.5–15.5)
WBC: 7.9 10*3/uL (ref 4.0–10.5)
nRBC: 0 % (ref 0.0–0.2)

## 2021-07-19 LAB — PREGNANCY, URINE: Preg Test, Ur: NEGATIVE

## 2021-07-19 LAB — COMPREHENSIVE METABOLIC PANEL
ALT: 24 U/L (ref 0–44)
AST: 25 U/L (ref 15–41)
Albumin: 4.6 g/dL (ref 3.5–5.0)
Alkaline Phosphatase: 48 U/L (ref 38–126)
Anion gap: 13 (ref 5–15)
BUN: 11 mg/dL (ref 6–20)
CO2: 17 mmol/L — ABNORMAL LOW (ref 22–32)
Calcium: 10.1 mg/dL (ref 8.9–10.3)
Chloride: 107 mmol/L (ref 98–111)
Creatinine, Ser: 0.81 mg/dL (ref 0.44–1.00)
GFR, Estimated: >60 mL/min (ref >60)
Glucose, Bld: 95 mg/dL (ref 70–99)
Potassium: 4 mmol/L (ref 3.5–5.1)
Sodium: 137 mmol/L (ref 135–145)
Total Bilirubin: 0.8 mg/dL (ref 0.3–1.2)
Total Protein: 7.9 g/dL (ref 6.5–8.1)

## 2021-07-19 LAB — URINALYSIS, ROUTINE W REFLEX MICROSCOPIC
Bilirubin Urine: NEGATIVE
Glucose, UA: NEGATIVE mg/dL
Hgb urine dipstick: NEGATIVE
Ketones, ur: 40 mg/dL — AB
Leukocytes,Ua: NEGATIVE
Nitrite: NEGATIVE
Specific Gravity, Urine: 1.032 — ABNORMAL HIGH (ref 1.005–1.030)
pH: 5 (ref 5.0–8.0)

## 2021-07-19 LAB — LIPASE, BLOOD: Lipase: 12 U/L (ref 11–51)

## 2021-07-19 MED ORDER — IOHEXOL 300 MG/ML  SOLN
100.0000 mL | Freq: Once | INTRAMUSCULAR | Status: AC | PRN
  Administered 2021-07-19: 100 mL via INTRAVENOUS

## 2021-07-19 MED ORDER — FENTANYL CITRATE PF 50 MCG/ML IJ SOSY
50.0000 ug | PREFILLED_SYRINGE | Freq: Once | INTRAMUSCULAR | Status: AC
  Administered 2021-07-19: 50 ug via INTRAVENOUS
  Filled 2021-07-19: qty 1

## 2021-07-19 NOTE — ED Triage Notes (Signed)
Pt arrives pov with c/o constipation x 2 days. Pt endorses rectal pressure and lower abdominal pain. Pt reports taking linsess with no relief

## 2021-07-19 NOTE — ED Provider Notes (Signed)
[facility] EMERGENCY DEPT Provider Note   CSN: [id number] Arrival date & time: 07/19/21  1240     History Chief Complaint  Patient presents with   Abdominal Pain   Constipation    [name] [name] is a 50 y.o. female.  Patient with history of chronic constipation, followed by Dr. Silverio Decamp (GI), presents with severe rectal pain and pressure. She reports she is taking her Linzess, using stool softeners but has had no significant bowel movement in 2 weeks. Today she had onset severe pain at the rectum like she needs to pass stool but can't. No fever. She reports nausea and vomiting over the last several days. No history of abdominal surgeries. No urinary symptoms.   The history is provided by the patient and the spouse.  Abdominal Pain Associated symptoms: constipation and vomiting   Associated symptoms: no chills, no dysuria and no fever   Constipation Associated symptoms: abdominal pain and vomiting   Associated symptoms: no dysuria and no fever       Past Medical History:  Diagnosis Date   Abnormal Pap smear, atypical squamous cells of undetermined sign (ASC-US) 10/98   CIN 1   Allergy    Asthma    Atypical nevus 07/29/2010   Right Medial Buttock-Moderate   Atypical nevus x 2 11/15/2006   Upper Right Back-Moderate and Lower Mid Back-Moderate to Marked (w/s)   Atypical nevus x 3 11/04/2007   Right Side-Slight to Moderate, Right Upper Side-Moderate, and Left Upper Breast-Moderate   Atypical nevus x 7 10/15/2006   Right Medial Hip-Moderate, Right Middle Hip-Slight, Right Lateral Hip-Mild, Right Axillary Vault-Moderate(w/s), Right Post Calf-Moderate(w/s),Right Inner Buttock-Slight, and Left Mid Back-Moderate(w/s)   Condyloma acuminata 2001   Endometriosis 8/95   DepoLupron 4/96-10/96   Hiatal hernia    History of campylobacteriosis    HSV-2 infection    Hyperlipidemia    IBS (irritable bowel syndrome)    Internal thrombosed hemorrhoids    Migraine     without aura   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   Right Upper Arm (tx p bx)    Patient Active Problem List   Diagnosis Date Noted   Overweight with body mass index (BMI) of 27 to 27.9 in adult 07/01/2021   Mild persistent asthma without complication 96/78/9381   Hyperlipidemia 07/01/2021   Mild intermittent asthma 03/19/2020   Overweight (BMI 25.0-29.9) 03/19/2020   Preventative health care 06/02/2019   Weight gain 06/02/2019   Nausea 06/02/2019   Adenomyosis 05/19/2019   INFECTIOUS COLITIS ENTERITIS AND GASTROENTERITIS 05/14/2009   Slow transit constipation 05/14/2009   IRRITABLE BOWEL SYNDROME 05/14/2009   INTERNAL THROMBOSED HEMORRHOIDS 12/31/2007    Past Surgical History:  Procedure Laterality Date   AUGMENTATION MAMMAPLASTY Bilateral    BREAST BIOPSY Left    benign   BREAST ENHANCEMENT SURGERY  02/2000   BREAST MASS EXCISION Right 12/01   fibroadenoma   EXPLORATORY LAPAROTOMY  8/95   endometriosis     OB History     Gravida  0   Para  0   Term  0   Preterm  0   AB  0   Living  0      SAB  0   IAB  0   Ectopic  0   Multiple  0   Live Births  0           Family History  Problem Relation Age of Onset   Irritable bowel syndrome Mother    Hernia  Mother    Ulcers Maternal Grandfather        gastrectomy   Leukemia Maternal Grandfather    Diabetes Maternal Grandfather    Prostate cancer Maternal Grandfather    Diabetes Maternal Grandmother    Coronary artery disease Maternal Grandmother    Stroke Maternal Grandmother    Heart failure Maternal Grandmother    Colon cancer Neg Hx    Esophageal cancer Neg Hx    Liver disease Neg Hx    Pancreatic cancer Neg Hx    Stomach cancer Neg Hx     Social History   Tobacco Use   Smoking status: Never   Smokeless tobacco: Never  Vaping Use   Vaping Use: Never used  Substance Use Topics   Alcohol use: Yes    Comment: occasionally   Drug use: No    Home  Medications Prior to Admission medications   Medication Sig Start Date End Date Taking? Authorizing Provider  lansoprazole (PREVACID) 30 MG capsule Take 1 capsule (30 mg total) by mouth daily at 12 noon. 07/11/20   Mauri Pole, MD  linaclotide (LINZESS) 72 MCG capsule Take 1 capsule (72 mcg total) by mouth daily before breakfast. 06/24/21   Mauri Pole, MD  Liraglutide -Weight Management (SAXENDA) 18 MG/3ML SOPN Inject 0.6 mg into the skin daily. 07/01/21   Ann Held, DO  norethindrone (AYGESTIN) 5 MG tablet Take 1 tablet (5 mg total) by mouth in the morning and at bedtime. 06/19/21   Megan Salon, MD  valACYclovir (VALTREX) 1000 MG tablet Take 1 bid x 3 days with symptom onset 06/19/21   Megan Salon, MD    Allergies    Aspirin, Doxycycline, Latex, and Sulfonamide derivatives  Review of Systems   Review of Systems  Constitutional:  Negative for chills and fever.  HENT: Negative.    Respiratory: Negative.    Cardiovascular: Negative.   Gastrointestinal:  Positive for abdominal pain, blood in stool, constipation, rectal pain and vomiting.  Genitourinary:  Negative for dysuria and pelvic pain.  Musculoskeletal: Negative.   Skin: Negative.   Neurological: Negative.    Physical Exam Updated Vital Signs BP (!) 120/102 (BP Location: Right Arm)   Pulse (!) 120   Temp 98.6 F (37 C)   Resp 18   Ht 5\' 8"  (1.727 m)   Wt 74.8 kg   LMP 07/12/2021   SpO2 97%   BMI 25.09 kg/m   Physical Exam Vitals and nursing note reviewed.  Constitutional:      Appearance: She is well-developed.  HENT:     Head: Normocephalic.  Cardiovascular:     Rate and Rhythm: Normal rate and regular rhythm.     Heart sounds: No murmur heard. Pulmonary:     Effort: Pulmonary effort is normal.     Breath sounds: Normal breath sounds. No wheezing, rhonchi or rales.  Abdominal:     General: Bowel sounds are normal.     Palpations: Abdomen is soft.     Tenderness: There is  abdominal tenderness in the right lower quadrant, suprapubic area and left lower quadrant. There is no guarding or rebound.  Genitourinary:    Comments: No impaction palpated on digital exam. No hemorrhoids, swelling or redness of the rectal area.  Musculoskeletal:        General: Normal range of motion.     Cervical back: Normal range of motion and neck supple.  Skin:    General: Skin is warm and dry.  Neurological:     General: No focal deficit present.     Mental Status: She is alert and oriented to person, place, and time.    ED Results / Procedures / Treatments   Labs (all labs ordered are listed, but only abnormal results are displayed) Labs Reviewed  COMPREHENSIVE METABOLIC PANEL - Abnormal; Notable for the following components:      Result Value   CO2 17 (*)    All other components within normal limits  CBC - Abnormal; Notable for the following components:   Hemoglobin 15.4 (*)    All other components within normal limits  LIPASE, BLOOD  URINALYSIS, ROUTINE W REFLEX MICROSCOPIC  PREGNANCY, URINE   Results for orders placed or performed during the hospital encounter of 07/19/21  Lipase, blood  Result Value Ref Range   Lipase 12 11 - 51 U/L  Comprehensive metabolic panel  Result Value Ref Range   Sodium 137 135 - 145 mmol/L   Potassium 4.0 3.5 - 5.1 mmol/L   Chloride 107 98 - 111 mmol/L   CO2 17 (L) 22 - 32 mmol/L   Glucose, Bld 95 70 - 99 mg/dL   BUN 11 6 - 20 mg/dL   Creatinine, Ser 0.81 0.44 - 1.00 mg/dL   Calcium 10.1 8.9 - 10.3 mg/dL   Total Protein 7.9 6.5 - 8.1 g/dL   Albumin 4.6 3.5 - 5.0 g/dL   AST 25 15 - 41 U/L   ALT 24 0 - 44 U/L   Alkaline Phosphatase 48 38 - 126 U/L   Total Bilirubin 0.8 0.3 - 1.2 mg/dL   GFR, Estimated >60 >60 mL/min   Anion gap 13 5 - 15  CBC  Result Value Ref Range   WBC 7.9 4.0 - 10.5 K/uL   RBC 4.58 3.87 - 5.11 MIL/uL   Hemoglobin 15.4 (H) 12.0 - 15.0 g/dL   HCT 45.4 36.0 - 46.0 %   MCV 99.1 80.0 - 100.0 fL   MCH 33.6  26.0 - 34.0 pg   MCHC 33.9 30.0 - 36.0 g/dL   RDW 12.4 11.5 - 15.5 %   Platelets 299 150 - 400 K/uL   nRBC 0.0 0.0 - 0.2 %  Urinalysis, Routine w reflex microscopic  Result Value Ref Range   Color, Urine YELLOW YELLOW   APPearance CLEAR CLEAR   Specific Gravity, Urine 1.032 (H) 1.005 - 1.030   pH 5.0 5.0 - 8.0   Glucose, UA NEGATIVE NEGATIVE mg/dL   Hgb urine dipstick NEGATIVE NEGATIVE   Bilirubin Urine NEGATIVE NEGATIVE   Ketones, ur 40 (A) NEGATIVE mg/dL   Protein, ur TRACE (A) NEGATIVE mg/dL   Nitrite NEGATIVE NEGATIVE   Leukocytes,Ua NEGATIVE NEGATIVE  Pregnancy, urine  Result Value Ref Range   Preg Test, Ur NEGATIVE NEGATIVE    EKG None  Radiology No results found. US PELVIS TRANSVAGINAL NON-OB (TV ONLY)  Result Date: 07/17/2021 CLINICAL DATA:  Irregular bleeding  EXAM: TRANSVAGINAL ULTRASOUND OF PELVIS  TECHNIQUE: Transvaginal ultrasound examination of the pelvis was performed.  FINDINGS: Uterus: 9.19cm x 5.65cm x 6.18cm with 2.1cm x 2.1cm intramural fibroid.  Volume: 168.26ml  Endometrial thickness:  1.06cm  Right ovary:  1.62cm x 2.46cm x 1.70cm.  Follicle measuring 6.0VP noted. Volume:  3.31ml  Left ovary:  1.71cm x 1.61cm x 1.27cm.  Volume:  1.52ml  Other findings:  No abnormal free fluid.  CT ABDOMEN PELVIS W CONTRAST  Result Date: 07/19/2021 CLINICAL DATA:  Bowel obstruction suspected EXAM: CT ABDOMEN AND  PELVIS WITH CONTRAST TECHNIQUE: Multidetector CT imaging of the abdomen and pelvis was performed using the standard protocol following bolus administration of intravenous contrast. CONTRAST:  145mL OMNIPAQUE IOHEXOL 300 MG/ML  SOLN COMPARISON:  07/15/2011 FINDINGS: Lower chest: Lung bases are clear. No effusions. Heart is normal size. Hepatobiliary: No focal hepatic abnormality. Gallbladder unremarkable. Pancreas: No focal abnormality or ductal dilatation. Spleen: No focal abnormality.  Normal size. Adrenals/Urinary Tract: No adrenal abnormality. No focal renal  abnormality. No stones or hydronephrosis. Urinary bladder is unremarkable. Stomach/Bowel: Stomach, large and small bowel grossly unremarkable. Vascular/Lymphatic: No evidence of aneurysm or adenopathy. Reproductive: Uterus and adnexa unremarkable.  No mass. Other: No free fluid or free air. Musculoskeletal: No acute bony abnormality. IMPRESSION: No acute findings. Electronically Signed   By: Rolm Baptise M.D.   On: 07/19/2021 16:04   MM 3D SCREEN BREAST W/IMPLANT BILATERAL  Result Date: 06/30/2021 CLINICAL DATA:  Screening. EXAM: DIGITAL SCREENING BILATERAL MAMMOGRAM WITH IMPLANTS, CAD AND TOMOSYNTHESIS TECHNIQUE: Bilateral screening digital craniocaudal and mediolateral oblique mammograms were obtained. Bilateral screening digital breast tomosynthesis was performed. The images were evaluated with computer-aided detection. Standard and/or implant displaced views were performed. COMPARISON:  Previous exam(s). ACR Breast Density Category c: The breast tissue is heterogeneously dense, which may obscure small masses. FINDINGS: The patient has retropectoral implants. There are no findings suspicious for malignancy. IMPRESSION: No mammographic evidence of malignancy. A result letter of this screening mammogram will be mailed directly to the patient. RECOMMENDATION: Screening mammogram in one year. (Code:SM-B-01Y) BI-RADS CATEGORY  1:  Negative. Electronically Signed   By: Lovey Newcomer M.D.   On: 06/30/2021 13:40    Procedures Procedures   Medications Ordered in ED Medications - No data to display  ED Course  I have reviewed the triage vital signs and the nursing notes.  Pertinent labs & imaging results that were available during my care of the patient were reviewed by me and considered in my medical decision making (see chart for details).    MDM Rules/Calculators/A&P                           Patient to ED with ss/sxs as per HPI.   She has significant abdominal tenderness, decreased bowel  movements with no significant stool in 2 weeks, history of colonic polyps requiring yearly colonoscopy. Will obtain CT scan for further evaluation.   She reports having a bowel movement since initial assessment describing loose stool, and feeling somewhat improved. Still has abdominal tenderness, CT felt appropriate.   CT shows no acute process, no obstruction or inflammation identified. She is feeling much better currently. No other bowel movement since last chest, but no pain now.   Encouraged to use enema at home, continue other medications and follow up with GI.   Final Clinical Impression(s) / ED Diagnoses Final diagnoses:  None   Rectal pain Constipation   Rx / DC Orders ED Discharge Orders     None        Charlann Lange, PA-C 07/19/21 1627    Sherwood Gambler, MD 07/20/21 [phone]

## 2021-07-19 NOTE — Telephone Encounter (Signed)
Spoke with Lynn Ito at [phone] who states that she is taking Linzess and he thinks that it is trying to kick it but her BM is too big and she cannot get it out.  Says that she is having a lot of pain from it and is about to pass out from the pain.  They are on their way to be evaluated at one of the nearby EDs.  She has not been seen in our office since June.  Agree with evaluation in the ED.

## 2021-07-19 NOTE — Discharge Instructions (Signed)
Recommend use of an enema tonight. Continue your regular medications. Follow up with gastroenterology if symptoms persist.   Return to the ED with any new or worsening symptoms at any time.

## 2021-07-19 NOTE — ED Notes (Signed)
Ct waiting on upreg results prior to imaging

## 2021-07-19 NOTE — ED Notes (Signed)
Pt stated that she just had a small bowel movement. Pt stated that it was not solid and was watery. She described the stool as dark and the amount was estimated to be scant.

## 2021-07-21 NOTE — Telephone Encounter (Signed)
Got it, thank you!!

## 2021-07-21 NOTE — Telephone Encounter (Signed)
Noted  

## 2021-07-22 ENCOUNTER — Telehealth: Payer: Self-pay | Admitting: Family Medicine

## 2021-07-22 NOTE — Telephone Encounter (Signed)
Appeal received.

## 2021-07-22 NOTE — Telephone Encounter (Signed)
Appeal sent.

## 2021-07-22 NOTE — Telephone Encounter (Signed)
Kathlee Nations: Cover My Meds. Called to make sure received appeal for medication below. Liraglutide -Weight Management ([place]) 18 MG/3ML [place] Reference number: [id number]

## 2021-07-24 ENCOUNTER — Telehealth: Payer: Self-pay

## 2021-07-24 NOTE — Telephone Encounter (Signed)
Patient calls with complaints of ongoing constipation. She is reporting hard stool alternating with watery stool. She feels she has great difficulty evacuating her bowels. She is eating high fiber diet. She takes Linzess 72 mcg x 2 every morning. "Things are still just not right." She has abdominal pain as well in her lower abdomen. She declines to see an APP. She is scheduled for an appointment with Dr Silverio Decamp on 09/02/21 and she is on the cancellation list.  Please advise. Thanks

## 2021-07-24 NOTE — Telephone Encounter (Signed)
Patient called states she has been having a lot of abdominal pain and constipation since her discharge from the hospital and is seeking advise on what to do.

## 2021-07-24 NOTE — Telephone Encounter (Signed)
Lm on vm for patient to return call 

## 2021-07-24 NOTE — Telephone Encounter (Signed)
ok 

## 2021-07-25 NOTE — Telephone Encounter (Signed)
Patient returned your phone call.  If you do not reach her when you call her back, she said it is ok to leave a detailed message on her cell phone.  Thank you.

## 2021-09-02 ENCOUNTER — Ambulatory Visit: Payer: No Typology Code available for payment source | Admitting: Gastroenterology

## 2021-09-02 ENCOUNTER — Encounter: Payer: Self-pay | Admitting: Gastroenterology

## 2021-09-02 ENCOUNTER — Other Ambulatory Visit: Payer: Self-pay | Admitting: Family Medicine

## 2021-09-02 VITALS — BP 120/80 | HR 100 | Ht 67.0 in | Wt 166.2 lb

## 2021-09-02 DIAGNOSIS — R110 Nausea: Secondary | ICD-10-CM

## 2021-09-02 DIAGNOSIS — Z86010 Personal history of colonic polyps: Secondary | ICD-10-CM

## 2021-09-02 DIAGNOSIS — J4530 Mild persistent asthma, uncomplicated: Secondary | ICD-10-CM

## 2021-09-02 DIAGNOSIS — K2270 Barrett's esophagus without dysplasia: Secondary | ICD-10-CM | POA: Diagnosis not present

## 2021-09-02 DIAGNOSIS — K5904 Chronic idiopathic constipation: Secondary | ICD-10-CM

## 2021-09-02 DIAGNOSIS — Z6827 Body mass index (BMI) 27.0-27.9, adult: Secondary | ICD-10-CM

## 2021-09-02 DIAGNOSIS — K5902 Outlet dysfunction constipation: Secondary | ICD-10-CM | POA: Diagnosis not present

## 2021-09-02 MED ORDER — SAXENDA 18 MG/3ML ~~LOC~~ SOPN: PEN_INJECTOR | SUBCUTANEOUS | 2 refills | Status: DC

## 2021-09-02 MED ORDER — ONDANSETRON HCL 4 MG PO TABS: 4.0000 mg | ORAL_TABLET | Freq: Every day | ORAL | 1 refills | Status: DC

## 2021-09-02 MED ORDER — LANSOPRAZOLE 30 MG PO CPDR: 30.0000 mg | DELAYED_RELEASE_CAPSULE | Freq: Every day | ORAL | 3 refills | Status: DC

## 2021-09-02 MED ORDER — LINACLOTIDE 145 MCG PO CAPS: 145.0000 ug | ORAL_CAPSULE | Freq: Every day | ORAL | 3 refills | Status: DC

## 2021-09-02 NOTE — Patient Instructions (Addendum)
You have been scheduled for a colonoscopy. Please follow written instructions given to you at your visit today.  Please pick up your prep supplies at the pharmacy within the next 1-3 days. If you use inhalers (even only as needed), please bring them with you on the day of your procedure.  Follow up in 3 months   If you are age 53 or older, your body mass index should be between 23-30. Your Body mass index is 26.04 kg/m. If this is out of the aforementioned range listed, please consider follow up with your Primary Care Provider.  If you are age 20 or younger, your body mass index should be between 19-25. Your Body mass index is 26.04 kg/m. If this is out of the aformentioned range listed, please consider follow up with your Primary Care Provider.   ________________________________________________________  The [facility] providers would like to encourage you to use [facility] to communicate with providers for non-urgent requests or questions.  Due to long hold times on the telephone, sending your provider a message by [facility] may be a faster and more efficient way to get a response.  Please allow 48 business hours for a response.  Please remember that this is for non-urgent requests.  _______________________________________________________   Due to recent changes in healthcare laws, you may see the results of your imaging and laboratory studies on MyChart before your provider has had a chance to review them.  We understand that in some cases there may be results that are confusing or concerning to you. Not all laboratory results come back in the same time frame and the provider may be waiting for multiple results in order to interpret others.  Please give [name] 48 hours in order for your provider to thoroughly review all the results before contacting the office for clarification of your results.    I appreciate the  opportunity to care for you  Thank You   Harl Bowie , MD

## 2021-09-02 NOTE — Progress Notes (Signed)
[name] [name]    [id number]    April 02, 1971  Primary Care Physician:Lowne Chase, Alferd Apa, DO  Referring Physician: Carollee Herter, Alferd Apa, DO [address] [place],  [place] [id number]   Chief complaint:  IBS constipation and diarrhea, LLQ abd pain, nausea  HPI:  51 year old very pleasant female here with complaints of alternating constipation and diarrhea, left side abdominal pain and nausea  She is using Linzess, sometimes has to take 2 capsules daily bowel movement  She continues to have irregular bowel habits with alternating constipation and diarrhea, also complains of left lower quadrant abdominal pain radiating to the mid abdomen, worse when she is constipated. She also experiences intermittent nausea Denies any rectal bleeding or melena     EGD July 27, 2011 by Dr. Olevia Perches: Mild gastritis otherwise normal exam   Colonoscopy May 30, 2009 by Dr. Olevia Perches for chronic abdominal pain: Normal  Colonoscopy 06/28/20  - Two 5 to 8 mm polyps in the transverse colon and in the cecum, removed with a cold snare. Resected and retrieved.  - One 15 mm polyp in the cecum, removed piecemeal using a cold snare. Resected and retrieved. - Diverticulosis in the sigmoid colon and in the descending colon. - Non-bleeding internal hemorrhoids.  1. Surgical [P], esophagus, GE junction - GASTROESOPHAGEAL MUCOSA WITH INTESTINAL METAPLASIA CONSISTENT WITH BARRETT'S ESOPHAGUS. - NO DYSPLASIA OR CARCINOMA. 2. Surgical [P], colon, cecum, transverse, polyp (3) - SESSILE SERRATED POLYP(S) WITHOUT CYTOLOGIC DYSPLASIA.  Outpatient Encounter Medications as of 09/02/2021  Medication Sig   linaclotide (LINZESS) 72 MCG capsule Take 1 capsule (72 mcg total) by mouth daily before breakfast.   Liraglutide -Weight Management (SAXENDA) 18 MG/3ML SOPN Inject 0.6 mg into the skin daily.   norethindrone (AYGESTIN) 5 MG tablet Take 1 tablet (5 mg total) by mouth in the morning and  at bedtime.   valACYclovir (VALTREX) 1000 MG tablet Take 1 bid x 3 days with symptom onset   [DISCONTINUED] lansoprazole (PREVACID) 30 MG capsule Take 1 capsule (30 mg total) by mouth daily at 12 noon.   No facility-administered encounter medications on file as of 09/02/2021.    Allergies as of 09/02/2021 - Review Complete 09/02/2021  Allergen Reaction Noted   Aspirin  05/14/2009   Doxycycline Nausea And Vomiting 11/15/2012   Latex  07/08/2011   Sulfonamide derivatives  05/14/2009    Past Medical History:  Diagnosis Date   Abnormal Pap smear, atypical squamous cells of undetermined sign (ASC-US) 10/98   CIN 1   Allergy    Asthma    Atypical nevus 07/29/2010   Right Medial Buttock-Moderate   Atypical nevus x 2 11/15/2006   Upper Right Back-Moderate and Lower Mid Back-Moderate to Marked (w/s)   Atypical nevus x 3 11/04/2007   Right Side-Slight to Moderate, Right Upper Side-Moderate, and Left Upper Breast-Moderate   Atypical nevus x 7 10/15/2006   Right Medial Hip-Moderate, Right Middle Hip-Slight, Right Lateral Hip-Mild, Right Axillary Vault-Moderate(w/s), Right Post Calf-Moderate(w/s),Right Inner Buttock-Slight, and Left Mid Back-Moderate(w/s)   Condyloma acuminata 2001   Endometriosis 8/95   DepoLupron 4/96-10/96   Hiatal hernia    History of campylobacteriosis    HSV-2 infection    Hyperlipidemia    IBS (irritable bowel syndrome)    Internal thrombosed hemorrhoids    Migraine    without aura   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   Right Upper Arm (tx p bx)  Past Surgical History:  Procedure Laterality Date   AUGMENTATION MAMMAPLASTY Bilateral    BREAST BIOPSY Left    benign   BREAST ENHANCEMENT SURGERY  02/2000   BREAST MASS EXCISION Right 12/01   fibroadenoma   EXPLORATORY LAPAROTOMY  8/95   endometriosis    Family History  Problem Relation Age of Onset   Irritable bowel syndrome Mother    Hernia Mother    Ulcers Maternal Grandfather         gastrectomy   Leukemia Maternal Grandfather    Diabetes Maternal Grandfather    Prostate cancer Maternal Grandfather    Diabetes Maternal Grandmother    Coronary artery disease Maternal Grandmother    Stroke Maternal Grandmother    Heart failure Maternal Grandmother    Colon cancer Neg Hx    Esophageal cancer Neg Hx    Liver disease Neg Hx    Pancreatic cancer Neg Hx    Stomach cancer Neg Hx     Social History   Socioeconomic History   Marital status: Married    Spouse name: Not on file   Number of children: 0   Years of education: Not on file   Highest education level: Not on file  Occupational History    Employer: [employer]  Tobacco Use   Smoking status: Never   Smokeless tobacco: Never  Vaping Use   Vaping Use: Never used  Substance and Sexual Activity   Alcohol use: Yes    Comment: occasionally   Drug use: No   Sexual activity: Yes    Birth control/protection: None  Other Topics Concern   Not on file  Social History Narrative   Currently unemployed --- looking for a job    Social Determinants of Health   Financial Resource Strain: Not on file  Food Insecurity: Not on file  Transportation Needs: Not on file  Physical Activity: Not on file  Stress: Not on file  Social Connections: Not on file  Intimate Partner Violence: Not on file      Review of systems: All other review of systems negative except as mentioned in the HPI.   Physical Exam: Vitals:   09/02/21 1001  BP: 120/80  Pulse: 100   Body mass index is 26.04 kg/m. 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  We will schedule it The risks and benefits as well as alternatives of endoscopic procedure(s) have been discussed and reviewed. All questions answered. The patient agrees to proceed.    The patient was provided an opportunity to ask questions and all were answered. The  patient agreed with the plan and demonstrated an understanding of the instructions.  Damaris Hippo , MD    CC: Carollee Herter, Alferd Apa, *

## 2021-09-04 ENCOUNTER — Other Ambulatory Visit: Payer: Self-pay

## 2021-09-04 DIAGNOSIS — E663 Overweight: Secondary | ICD-10-CM

## 2021-09-04 DIAGNOSIS — J4530 Mild persistent asthma, uncomplicated: Secondary | ICD-10-CM

## 2021-09-04 MED ORDER — SAXENDA 18 MG/3ML ~~LOC~~ SOPN: PEN_INJECTOR | SUBCUTANEOUS | 2 refills | Status: DC

## 2021-09-15 ENCOUNTER — Encounter: Payer: Self-pay | Admitting: Gastroenterology

## 2021-09-25 ENCOUNTER — Encounter: Payer: Self-pay | Admitting: Gastroenterology

## 2021-10-01 ENCOUNTER — Other Ambulatory Visit: Payer: Self-pay

## 2021-10-01 ENCOUNTER — Encounter: Payer: Self-pay | Admitting: Gastroenterology

## 2021-10-01 ENCOUNTER — Ambulatory Visit (AMBULATORY_SURGERY_CENTER): Payer: No Typology Code available for payment source | Admitting: Gastroenterology

## 2021-10-01 VITALS — BP 128/74 | HR 78 | Temp 97.7°F | Resp 13 | Ht 67.0 in | Wt 166.0 lb

## 2021-10-01 DIAGNOSIS — K635 Polyp of colon: Secondary | ICD-10-CM

## 2021-10-01 DIAGNOSIS — D123 Benign neoplasm of transverse colon: Secondary | ICD-10-CM

## 2021-10-01 DIAGNOSIS — K5904 Chronic idiopathic constipation: Secondary | ICD-10-CM

## 2021-10-01 DIAGNOSIS — Z86010 Personal history of colonic polyps: Secondary | ICD-10-CM | POA: Diagnosis present

## 2021-10-01 DIAGNOSIS — D122 Benign neoplasm of ascending colon: Secondary | ICD-10-CM

## 2021-10-01 MED ORDER — SODIUM CHLORIDE 0.9 % IV SOLN: 500.0000 mL | Freq: Once | INTRAVENOUS | Status: DC

## 2021-10-01 NOTE — Op Note (Signed)
[facility] Patient Name: [name] [name] Procedure Date: 10/01/2021 9:58 AM MRN: [id number] Endoscopist: Mauri Pole , MD Age: 51 Referring MD:  Date of Birth: 09-13-1970 Gender: Female Account #: [id number] Procedure:                Colonoscopy Indications:              High risk colon cancer surveillance: Personal                            history of colonic polyps, High risk colon cancer                            surveillance: Personal history of adenoma (10 mm or                            greater in size) Medicines:                Monitored Anesthesia Care Procedure:                Pre-Anesthesia Assessment:                           - Prior to the procedure, a History and Physical                            was performed, and patient medications and                            allergies were reviewed. The patient's tolerance of                            previous anesthesia was also reviewed. The risks                            and benefits of the procedure and the sedation                            options and risks were discussed with the patient.                            All questions were answered, and informed consent                            was obtained. Prior Anticoagulants: The patient has                            taken no previous anticoagulant or antiplatelet                            agents. ASA Grade Assessment: II - A patient with                            mild systemic disease. After reviewing the risks  and benefits, the patient was deemed in                            satisfactory condition to undergo the procedure.                           After obtaining informed consent, the colonoscope                            was passed under direct vision. Throughout the                            procedure, the patient's blood pressure, pulse, and                            oxygen saturations were monitored  continuously. The                            Olympus PCF-H190DL ([id number]) Colonoscope was                            introduced through the anus and advanced to the the                            cecum, identified by appendiceal orifice and                            ileocecal valve. The colonoscopy was performed                            without difficulty. The patient tolerated the                            procedure well. The quality of the bowel                            preparation was adequate. The ileocecal valve,                            appendiceal orifice, and rectum were photographed. Scope In: 10:03:20 AM Scope Out: 10:25:17 AM Scope Withdrawal Time: 0 hours 12 minutes 16 seconds  Total Procedure Duration: 0 hours 21 minutes 57 seconds  Findings:                 The perianal and digital rectal examinations were                            normal.                           A 1 mm polyp was found in the ascending colon. The                            polyp was sessile. The polyp was removed with a  cold biopsy forceps. Resection and retrieval were                            complete.                           A 5 mm polyp was found in the transverse colon. The                            polyp was sessile. The polyp was removed with a                            cold snare. Resection and retrieval were complete.                           Scattered small-mouthed diverticula were found in                            the sigmoid colon, descending colon and transverse                            colon.                           External and internal hemorrhoids were found during                            retroflexion. The hemorrhoids were small. Complications:            No immediate complications. Estimated Blood Loss:     Estimated blood loss was minimal. Impression:               - One 1 mm polyp in the ascending colon, removed                             with a cold biopsy forceps. Resected and retrieved.                           - One 5 mm polyp in the transverse colon, removed                            with a cold snare. Resected and retrieved.                           - Diverticulosis in the sigmoid colon, in the                            descending colon and in the transverse colon.                           - External and internal hemorrhoids. Recommendation:           - Patient has a contact number available for  emergencies. The signs and symptoms of potential                            delayed complications were discussed with the                            patient. Return to normal activities tomorrow.                            Written discharge instructions were provided to the                            patient.                           - Resume previous diet.                           - Continue present medications.                           - Await pathology results.                           - Repeat colonoscopy in 5 years for surveillance                            based on pathology results. Mauri Pole, MD 10/01/2021 10:34:06 AM This report has been signed electronically.

## 2021-10-01 NOTE — Progress Notes (Signed)
Called to room to assist during endoscopic procedure.  Patient ID and intended procedure confirmed with present staff. Received instructions for my participation in the procedure from the performing physician.  

## 2021-10-01 NOTE — Progress Notes (Signed)
VS  DT ? ?Pt's states no medical or surgical changes since previsit or office visit. ? ?

## 2021-10-01 NOTE — Patient Instructions (Signed)
Please read handouts provided. Continue present medications. Await pathology results. Repeat colonoscopy in 5 years for screening.   YOU HAD AN ENDOSCOPIC PROCEDURE TODAY AT [facility]:   Refer to the procedure report that was given to you for any specific questions about what was found during the examination.  If the procedure report does not answer your questions, please call your gastroenterologist to clarify.  If you requested that your care partner not be given the details of your procedure findings, then the procedure report has been included in a sealed envelope for you to review at your convenience later.  YOU SHOULD EXPECT: Some feelings of bloating in the abdomen. Passage of more gas than usual.  Walking can help get rid of the air that was put into your GI tract during the procedure and reduce the bloating. If you had a lower endoscopy (such as a colonoscopy or flexible sigmoidoscopy) you may notice spotting of blood in your stool or on the toilet paper. If you underwent a bowel prep for your procedure, you may not have a normal bowel movement for a few days.  Please Note:  You might notice some irritation and congestion in your nose or some drainage.  This is from the oxygen used during your procedure.  There is no need for concern and it should clear up in a day or so.  SYMPTOMS TO REPORT IMMEDIATELY:  Following lower endoscopy (colonoscopy or flexible sigmoidoscopy):  Excessive amounts of blood in the stool  Significant tenderness or worsening of abdominal pains  Swelling of the abdomen that is new, acute  Fever of 100F or higher   For urgent or emergent issues, a gastroenterologist can be reached at any hour by calling [phone]. Do not use MyChart messaging for urgent concerns.    DIET:  We do recommend a small meal at first, but then you may proceed to your regular diet.  Drink plenty of fluids but you should avoid alcoholic beverages for 24  hours.  ACTIVITY:  You should plan to take it easy for the rest of today and you should NOT DRIVE or use heavy machinery until tomorrow (because of the sedation medicines used during the test).    FOLLOW UP: Our staff will call the number listed on your records 48-72 hours following your procedure to check on you and address any questions or concerns that you may have regarding the information given to you following your procedure. If we do not reach you, we will leave a message.  We will attempt to reach you two times.  During this call, we will ask if you have developed any symptoms of COVID 19. If you develop any symptoms (ie: fever, flu-like symptoms, shortness of breath, cough etc.) before then, please call [phone].  If you test positive for Covid 19 in the 2 weeks post procedure, please call and report this information to [name].    If any biopsies were taken you will be contacted by phone or by letter within the next 1-3 weeks.  Please call us at [phone] if you have not heard about the biopsies in 3 weeks.    SIGNATURES/CONFIDENTIALITY: You and/or your care partner have signed paperwork which will be entered into your electronic medical record.  These signatures attest to the fact that that the information above on your After Visit Summary has been reviewed and is understood.  Full responsibility of the confidentiality of this discharge information lies with you and/or your care-partner.

## 2021-10-01 NOTE — Progress Notes (Signed)
To Pacu, VSS. Report to [email or web address] 

## 2021-10-01 NOTE — Progress Notes (Signed)
Please refer to office visit note 09/02/21. No additional changes in H&P °Patient is appropriate for planned procedure(s) and anesthesia in an ambulatory setting ° °[name] , MD °[phone]   °

## 2021-10-02 ENCOUNTER — Ambulatory Visit: Payer: No Typology Code available for payment source | Admitting: Family Medicine

## 2021-10-03 ENCOUNTER — Telehealth: Payer: Self-pay | Admitting: *Deleted

## 2021-10-03 NOTE — Telephone Encounter (Signed)
Attempted f/u phone call. No answer. Left message. °

## 2021-10-03 NOTE — Telephone Encounter (Signed)
No answer on second attempt follow up call.

## 2021-10-06 ENCOUNTER — Other Ambulatory Visit: Payer: Self-pay | Admitting: Gastroenterology

## 2021-10-06 DIAGNOSIS — K5901 Slow transit constipation: Secondary | ICD-10-CM

## 2021-10-09 ENCOUNTER — Ambulatory Visit (INDEPENDENT_AMBULATORY_CARE_PROVIDER_SITE_OTHER): Payer: No Typology Code available for payment source | Admitting: Family Medicine

## 2021-10-09 DIAGNOSIS — J4530 Mild persistent asthma, uncomplicated: Secondary | ICD-10-CM | POA: Diagnosis not present

## 2021-10-09 DIAGNOSIS — E663 Overweight: Secondary | ICD-10-CM | POA: Diagnosis not present

## 2021-10-09 DIAGNOSIS — Z6827 Body mass index (BMI) 27.0-27.9, adult: Secondary | ICD-10-CM | POA: Diagnosis not present

## 2021-10-09 MED ORDER — SAXENDA 18 MG/3ML ~~LOC~~ SOPN: PEN_INJECTOR | SUBCUTANEOUS | 2 refills | Status: DC

## 2021-10-09 NOTE — Progress Notes (Signed)
Subjective:   By signing my name below, I, Shehryar Baig, attest that this documentation has been prepared under the direction and in the presence of Ann Held, DO. 10/09/2021    Patient ID: [name] [name], female    DOB: 07-23-1971, 51 y.o.   MRN: [id number]  Chief Complaint  Patient presents with   Follow-up    Here for Follow Up     HPI Patient is in today for a follow up visit.  She continues taking 3 ml saxenda and reports losing some weight while using it. She is planning on losing more weight. She is requesting a refill on it as well.  Wt Readings from Last 3 Encounters:  10/09/21 164 lb 6.4 oz (74.6 kg)  10/01/21 166 lb (75.3 kg)  09/02/21 166 lb 4 oz (75.4 kg)   She is trying to control her cholesterol with her diet. She is cutting out cheese from her diet. She is participating in regular exercise. Lab Results  Component Value Date   CHOL 323 (H) 06/19/2021   HDL 93 06/19/2021   LDLCALC 221 (H) 06/19/2021   TRIG 65 06/19/2021   CHOLHDL 3.5 06/19/2021    Past Medical History:  Diagnosis Date   Abnormal Pap smear, atypical squamous cells of undetermined sign (ASC-US) 10/98   CIN 1   Allergy    Asthma    Atypical nevus 07/29/2010   Right Medial Buttock-Moderate   Atypical nevus x 2 11/15/2006   Upper Right Back-Moderate and Lower Mid Back-Moderate to Marked (w/s)   Atypical nevus x 3 11/04/2007   Right Side-Slight to Moderate, Right Upper Side-Moderate, and Left Upper Breast-Moderate   Atypical nevus x 7 10/15/2006   Right Medial Hip-Moderate, Right Middle Hip-Slight, Right Lateral Hip-Mild, Right Axillary Vault-Moderate(w/s), Right Post Calf-Moderate(w/s),Right Inner Buttock-Slight, and Left Mid Back-Moderate(w/s)   Condyloma acuminata 2001   Endometriosis 8/95   DepoLupron 4/96-10/96   Hiatal hernia    History of campylobacteriosis    HSV-2 infection    Hyperlipidemia    IBS (irritable bowel syndrome)    Internal thrombosed hemorrhoids     Migraine    without aura   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   Right Upper Arm (tx p bx)    Past Surgical History:  Procedure Laterality Date   AUGMENTATION MAMMAPLASTY Bilateral    BREAST BIOPSY Left    benign   BREAST ENHANCEMENT SURGERY  02/2000   BREAST MASS EXCISION Right 12/01   fibroadenoma   EXPLORATORY LAPAROTOMY  8/95   endometriosis    Family History  Problem Relation Age of Onset   Irritable bowel syndrome Mother    Hernia Mother    Ulcers Maternal Grandfather        gastrectomy   Leukemia Maternal Grandfather    Diabetes Maternal Grandfather    Prostate cancer Maternal Grandfather    Diabetes Maternal Grandmother    Coronary artery disease Maternal Grandmother    Stroke Maternal Grandmother    Heart failure Maternal Grandmother    Colon cancer Neg Hx    Esophageal cancer Neg Hx    Liver disease Neg Hx    Pancreatic cancer Neg Hx    Stomach cancer Neg Hx     Social History   Socioeconomic History   Marital status: Married    Spouse name: Not on file   Number of children: 0   Years of education: Not on file   Highest education level: Not on file  Occupational History    Employer: [employer]  Tobacco Use   Smoking status: Never   Smokeless tobacco: Never  Vaping Use   Vaping Use: Never used  Substance and Sexual Activity   Alcohol use: Yes    Comment: occasionally   Drug use: No   Sexual activity: Yes    Birth control/protection: None  Other Topics Concern   Not on file  Social History Narrative   Currently unemployed --- looking for a job    Social Determinants of Radio broadcast assistant Strain: Not on file  Food Insecurity: Not on file  Transportation Needs: Not on file  Physical Activity: Not on file  Stress: Not on file  Social Connections: Not on file  Intimate Partner Violence: Not on file    Outpatient Medications Prior to Visit  Medication Sig Dispense Refill   lansoprazole (PREVACID) 30  MG capsule Take 1 capsule (30 mg total) by mouth daily at 12 noon. 90 capsule 3   linaclotide (LINZESS) 145 MCG CAPS capsule Take 1 capsule (145 mcg total) by mouth daily before breakfast. 30 capsule 3   LINZESS 72 MCG capsule TAKE 1 CAPSULE BY MOUTH EVERY DAY BEFORE BREAKFAST 30 capsule 2   Liraglutide -Weight Management (SAXENDA) 18 MG/3ML SOPN 3 mg  sq daily 15 mL 2   norethindrone (AYGESTIN) 5 MG tablet Take 1 tablet (5 mg total) by mouth in the morning and at bedtime. 60 tablet 0   ondansetron (ZOFRAN) 4 MG tablet Take 1 tablet (4 mg total) by mouth daily at 2 PM. As needed 30 tablet 1   valACYclovir (VALTREX) 1000 MG tablet Take 1 bid x 3 days with symptom onset 30 tablet 2   No facility-administered medications prior to visit.    Allergies  Allergen Reactions   Aspirin     REACTION: gi upset   Doxycycline Nausea And Vomiting   Latex    Sulfonamide Derivatives     Review of Systems  Constitutional:  Negative for fever and malaise/fatigue.  HENT:  Negative for congestion.   Eyes:  Negative for blurred vision.  Respiratory:  Negative for shortness of breath.   Cardiovascular:  Negative for chest pain, palpitations and leg swelling.  Gastrointestinal:  Negative for abdominal pain, blood in stool and nausea.  Genitourinary:  Negative for dysuria and frequency.  Musculoskeletal:  Negative for falls.  Skin:  Negative for rash.  Neurological:  Negative for dizziness, loss of consciousness and headaches.  Endo/Heme/Allergies:  Negative for environmental allergies.  Psychiatric/Behavioral:  Negative for depression. The patient is not nervous/anxious.       Objective:    Physical Exam Constitutional:      General: She is not in acute distress.    Appearance: Normal appearance. She is not ill-appearing.  HENT:     Head: Normocephalic and atraumatic.     Right Ear: External ear normal.     Left Ear: External ear normal.  Eyes:     Extraocular Movements: Extraocular movements  intact.     Pupils: Pupils are equal, round, and reactive to light.  Cardiovascular:     Rate and Rhythm: Normal rate and regular rhythm.     Heart sounds: Normal heart sounds. No murmur heard.   No gallop.  Pulmonary:     Effort: Pulmonary effort is normal. No respiratory distress.     Breath sounds: Normal breath sounds. No wheezing or rales.  Skin:    General: Skin is warm and dry.  Neurological:     Mental Status: She is alert and oriented to person, place, and time.  Psychiatric:        Judgment: Judgment normal.    BP 124/78 (BP Location: Right Arm, Patient Position: Sitting, Cuff Size: Normal)    Pulse 88    Temp 98.4 F (36.9 C) (Oral)    Resp 16    Ht 5' 7"  (1.702 m)    Wt 164 lb 6.4 oz (74.6 kg)    LMP 09/17/2021    SpO2 97%    BMI 25.75 kg/m  Wt Readings from Last 3 Encounters:  10/09/21 164 lb 6.4 oz (74.6 kg)  10/01/21 166 lb (75.3 kg)  09/02/21 166 lb 4 oz (75.4 kg)    Diabetic Foot Exam - Simple   No data filed    Lab Results  Component Value Date   WBC 7.9 07/19/2021   HGB 15.4 (H) 07/19/2021   HCT 45.4 07/19/2021   PLT 299 07/19/2021   GLUCOSE 95 07/19/2021   CHOL 323 (H) 06/19/2021   TRIG 65 06/19/2021   HDL 93 06/19/2021   LDLCALC 221 (H) 06/19/2021   ALT 24 07/19/2021   AST 25 07/19/2021   NA 137 07/19/2021   K 4.0 07/19/2021   CL 107 07/19/2021   CREATININE 0.81 07/19/2021   BUN 11 07/19/2021   CO2 17 (L) 07/19/2021   TSH 2.750 06/19/2021   HGBA1C 5.2 03/05/2020    Lab Results  Component Value Date   TSH 2.750 06/19/2021   Lab Results  Component Value Date   WBC 7.9 07/19/2021   HGB 15.4 (H) 07/19/2021   HCT 45.4 07/19/2021   MCV 99.1 07/19/2021   PLT 299 07/19/2021   Lab Results  Component Value Date   NA 137 07/19/2021   K 4.0 07/19/2021   CO2 17 (L) 07/19/2021   GLUCOSE 95 07/19/2021   BUN 11 07/19/2021   CREATININE 0.81 07/19/2021   BILITOT 0.8 07/19/2021   ALKPHOS 48 07/19/2021   AST 25 07/19/2021   ALT 24  07/19/2021   PROT 7.9 07/19/2021   ALBUMIN 4.6 07/19/2021   CALCIUM 10.1 07/19/2021   ANIONGAP 13 07/19/2021   EGFR 101 06/19/2021   GFR 83.11 06/02/2019   Lab Results  Component Value Date   CHOL 323 (H) 06/19/2021   Lab Results  Component Value Date   HDL 93 06/19/2021   Lab Results  Component Value Date   LDLCALC 221 (H) 06/19/2021   Lab Results  Component Value Date   TRIG 65 06/19/2021   Lab Results  Component Value Date   CHOLHDL 3.5 06/19/2021   Lab Results  Component Value Date   HGBA1C 5.2 03/05/2020       Assessment & Plan:   Problem List Items Addressed This Visit   None    No orders of the defined types were placed in this encounter.   I, Shehryar [place], personally preformed the services described in this documentation.  All medical record entries made by the scribe were at my direction and in my presence.  I have reviewed the chart and discharge instructions (if applicable) and agree that the record reflects my personal performance and is accurate and complete. 10/09/2021   I,Shehryar Baig,acting as a scribe for Ann Held, DO.,have documented all relevant documentation on the behalf of Ann Held, DO,as directed by  Ann Held, DO while in the presence of Ann Held, DO.  Shehryar [employer]

## 2021-10-09 NOTE — Patient Instructions (Signed)

## 2021-10-12 ENCOUNTER — Encounter: Payer: Self-pay | Admitting: Family Medicine

## 2021-10-12 NOTE — Assessment & Plan Note (Signed)
con't saxenda   ?F/u 3 months  ? ?

## 2021-10-14 ENCOUNTER — Encounter: Payer: Self-pay | Admitting: Gastroenterology

## 2021-12-02 ENCOUNTER — Telehealth: Payer: Self-pay | Admitting: Family Medicine

## 2021-12-02 NOTE — Telephone Encounter (Signed)
Pt called stating she had some concerns with her saxenda that she is taking and was wondering about discussing switching to different medication with Dr. Etter Sjogren. Pt would like a call back when she is available. Please Advise. ?

## 2021-12-02 NOTE — Telephone Encounter (Signed)
Spoke with patient. Pt states having concerns regarding the weight loss medication and concerns for the black box warnings. Pt states she will make a virtual visit to discuss concerns with [place].  ?

## 2021-12-07 ENCOUNTER — Other Ambulatory Visit (HOSPITAL_BASED_OUTPATIENT_CLINIC_OR_DEPARTMENT_OTHER): Payer: Self-pay | Admitting: Obstetrics & Gynecology

## 2021-12-07 DIAGNOSIS — B009 Herpesviral infection, unspecified: Secondary | ICD-10-CM

## 2021-12-15 ENCOUNTER — Other Ambulatory Visit (HOSPITAL_BASED_OUTPATIENT_CLINIC_OR_DEPARTMENT_OTHER): Payer: Self-pay | Admitting: Obstetrics & Gynecology

## 2021-12-15 DIAGNOSIS — B009 Herpesviral infection, unspecified: Secondary | ICD-10-CM

## 2022-01-01 ENCOUNTER — Ambulatory Visit: Payer: No Typology Code available for payment source | Admitting: Orthopedic Surgery

## 2022-01-08 ENCOUNTER — Ambulatory Visit (INDEPENDENT_AMBULATORY_CARE_PROVIDER_SITE_OTHER): Payer: No Typology Code available for payment source | Admitting: Family Medicine

## 2022-01-08 ENCOUNTER — Encounter: Payer: Self-pay | Admitting: Family Medicine

## 2022-01-08 VITALS — BP 118/80 | HR 103 | Temp 97.7°F | Resp 18 | Ht 67.0 in | Wt 155.8 lb

## 2022-01-08 DIAGNOSIS — Z6827 Body mass index (BMI) 27.0-27.9, adult: Secondary | ICD-10-CM

## 2022-01-08 DIAGNOSIS — Z79899 Other long term (current) drug therapy: Secondary | ICD-10-CM | POA: Diagnosis not present

## 2022-01-08 DIAGNOSIS — E663 Overweight: Secondary | ICD-10-CM

## 2022-01-08 DIAGNOSIS — E785 Hyperlipidemia, unspecified: Secondary | ICD-10-CM | POA: Diagnosis not present

## 2022-01-08 DIAGNOSIS — J4530 Mild persistent asthma, uncomplicated: Secondary | ICD-10-CM | POA: Diagnosis not present

## 2022-01-08 MED ORDER — SAXENDA 18 MG/3ML ~~LOC~~ SOPN: PEN_INJECTOR | SUBCUTANEOUS | 2 refills | Status: DC

## 2022-01-08 NOTE — Assessment & Plan Note (Signed)
Doing well with saxenda  rto 3 months

## 2022-01-08 NOTE — Patient Instructions (Signed)
Obesity, Adult Obesity is the condition of having too much total body fat. Being overweight or obese means that your weight is greater than what is considered healthy for your body size. Obesity is determined by a measurement called BMI (body mass index). BMI is an estimate of body fat and is calculated from height and weight. For adults, a BMI of 30 or higher is considered obese. Obesity can lead to other health concerns and major illnesses, including: Stroke. Coronary artery disease (CAD). Type 2 diabetes. Some types of cancer, including cancers of the colon, breast, uterus, and gallbladder. High blood pressure (hypertension). High cholesterol. Gallbladder stones. Obesity can also contribute to: Osteoarthritis. Sleep apnea. Infertility problems. What are the causes? Common causes of this condition include: Eating daily meals that are high in calories, sugar, and fat. Drinking high amounts of sugar-sweetened beverages, such as soft drinks. Being born with genes that may make you more likely to become obese. Having a medical condition that causes obesity, including: Hypothyroidism. Polycystic ovarian syndrome (PCOS). Binge-eating disorder. Cushing syndrome. Taking certain medicines, such as steroids, antidepressants, and seizure medicines. Not being physically active (sedentary lifestyle). Not getting enough sleep. What increases the risk? The following factors may make you more likely to develop this condition: Having a family history of obesity. Living in an area with limited access to: Parks, recreation centers, or sidewalks. Healthy food choices, such as grocery stores and farmers' markets. What are the signs or symptoms? The main sign of this condition is having too much body fat. How is this diagnosed? This condition is diagnosed based on: Your BMI. If you are an adult with a BMI of 30 or higher, you are considered obese. Your waist circumference. This measures the  distance around your waistline. Your skinfold thickness. Your health care provider may gently pinch a fold of your skin and measure it. You may have other tests to check for underlying conditions. How is this treated? Treatment for this condition often includes changing your lifestyle. Treatment may include some or all of the following: Dietary changes. This may include developing a healthy meal plan. Regular physical activity. This may include activity that causes your heart to beat faster (aerobic exercise) and strength training. Work with your health care provider to design an exercise program that works for you. Medicine to help you lose weight if you are unable to lose one pound a week after six weeks of healthy eating and more physical activity. Treating conditions that cause the obesity (underlying conditions). Surgery. Surgical options may include gastric banding and gastric bypass. Surgery may be done if: Other treatments have not helped to improve your condition. You have a BMI of 40 or higher. You have life-threatening health problems related to obesity. Follow these instructions at home: Eating and drinking  Follow recommendations from your health care provider about what you eat and drink. Your health care provider may advise you to: Limit fast food, sweets, and processed snack foods. Choose low-fat options, such as low-fat milk instead of whole milk. Eat five or more servings of fruits or vegetables every day. Choose healthy foods when you eat out. Keep low-fat snacks available. Limit sugary drinks, such as soda, fruit juice, sweetened iced tea, and flavored milk. Drink enough water to keep your urine pale yellow. Do not follow a fad diet. Fad diets can be unhealthy and even dangerous. Other healthful choices include: Eat at home more often. This gives you more control over what you eat. Learn to read food labels.   This will help you understand how much food is considered one  serving. Learn what a healthy serving size is. Physical activity Exercise regularly, as told by your health care provider. Most adults should get up to 150 minutes of moderate-intensity exercise every week. Ask your health care provider what types of exercise are safe for you and how often you should exercise. Warm up and stretch before being active. Cool down and stretch after being active. Rest between periods of activity. Lifestyle Work with your health care provider and a dietitian to set a weight-loss goal that is healthy and reasonable for you. Limit your screen time. Find ways to reward yourself that do not involve food. Do not drink alcohol if: Your health care provider tells you not to drink. You are pregnant, may be pregnant, or are planning to become pregnant. If you drink alcohol: Limit how much you have to: 0-1 drink a day for women. 0-2 drinks a day for men. Know how much alcohol is in your drink. In the [place] one drink equals one 12 oz bottle of beer (355 mL), one 5 oz glass of wine (148 mL), or one 1 oz glass of hard liquor (44 mL). General instructions Keep a weight-loss journal to keep track of the food you eat and how much exercise you get. Take over-the-counter and prescription medicines only as told by your health care provider. Take vitamins and supplements only as told by your health care provider. Consider joining a support group. Your health care provider may be able to recommend a support group. Pay attention to your mental health as obesity can lead to depression or self esteem issues. Keep all follow-up visits. This is important. Contact a health care provider if: You are unable to meet your weight-loss goal after six weeks of dietary and lifestyle changes. You have trouble breathing. Summary Obesity is the condition of having too much total body fat. Being overweight or obese means that your weight is greater than what is considered healthy for your body  size. Work with your health care provider and a dietitian to set a weight-loss goal that is healthy and reasonable for you. Exercise regularly, as told by your health care provider. Ask your health care provider what types of exercise are safe for you and how often you should exercise. This information is not intended to replace advice given to you by your health care provider. Make sure you discuss any questions you have with your health care provider. Document Revised: 03/04/2021 Document Reviewed: 03/04/2021 Elsevier Patient Education  2023 Elsevier Inc.  

## 2022-01-08 NOTE — Progress Notes (Signed)
Subjective:   By signing my name below, I, Carylon Perches, attest that this documentation has been prepared under the direction and in the presence of Roma Schanz DO, 01/08/2022     Patient ID: [name] [name], female    DOB: 1970/08/28, 51 y.o.   MRN: [id number]  Chief Complaint  Patient presents with   Weight Check    HPI Patient is in today for an office visit  She is requesting a refill of 3 MG Saxenda.   She is complaining of a shooting pain that radiates from her left lower back to the outside of her leg and often radiating through her abdomen.  She also reports feeling a lump on her left lower side of her back. She reports that she will further discuss symptoms with Dr. Marlou Sa.     She expresses concern that the 3 Mg of Saxenda is possibly causing complications with her liver and kidney functions. She is requesting to get her functions checked.    She is interested in checking her cholesterol levels. Lab Results  Component Value Date   CHOL 323 (H) 06/19/2021   HDL 93 06/19/2021   LDLCALC 221 (H) 06/19/2021   TRIG 65 06/19/2021   CHOLHDL 3.5 06/19/2021   She reports that nausea symptoms related to Agency are resolved.   Past Medical History:  Diagnosis Date   Abnormal Pap smear, atypical squamous cells of undetermined sign (ASC-US) 10/98   CIN 1   Allergy    Asthma    Atypical nevus 07/29/2010   Right Medial Buttock-Moderate   Atypical nevus x 2 11/15/2006   Upper Right Back-Moderate and Lower Mid Back-Moderate to Marked (w/s)   Atypical nevus x 3 11/04/2007   Right Side-Slight to Moderate, Right Upper Side-Moderate, and Left Upper Breast-Moderate   Atypical nevus x 7 10/15/2006   Right Medial Hip-Moderate, Right Middle Hip-Slight, Right Lateral Hip-Mild, Right Axillary Vault-Moderate(w/s), Right Post Calf-Moderate(w/s),Right Inner Buttock-Slight, and Left Mid Back-Moderate(w/s)   Condyloma acuminata 2001   Endometriosis 8/95   DepoLupron 4/96-10/96    Hiatal hernia    History of campylobacteriosis    HSV-2 infection    Hyperlipidemia    IBS (irritable bowel syndrome)    Internal thrombosed hemorrhoids    Migraine    without aura   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   Right Upper Arm (tx p bx)    Past Surgical History:  Procedure Laterality Date   AUGMENTATION MAMMAPLASTY Bilateral    BREAST BIOPSY Left    benign   BREAST ENHANCEMENT SURGERY  02/2000   BREAST MASS EXCISION Right 12/01   fibroadenoma   EXPLORATORY LAPAROTOMY  8/95   endometriosis    Family History  Problem Relation Age of Onset   Irritable bowel syndrome Mother    Hernia Mother    Ulcers Maternal Grandfather        gastrectomy   Leukemia Maternal Grandfather    Diabetes Maternal Grandfather    Prostate cancer Maternal Grandfather    Diabetes Maternal Grandmother    Coronary artery disease Maternal Grandmother    Stroke Maternal Grandmother    Heart failure Maternal Grandmother    Colon cancer Neg Hx    Esophageal cancer Neg Hx    Liver disease Neg Hx    Pancreatic cancer Neg Hx    Stomach cancer Neg Hx     Social History   Socioeconomic History   Marital status: Married    Spouse name: Not on file  Number of children: 0   Years of education: Not on file   Highest education level: Not on file  Occupational History    Employer: [employer]  Tobacco Use   Smoking status: Never   Smokeless tobacco: Never  Vaping Use   Vaping Use: Never used  Substance and Sexual Activity   Alcohol use: Yes    Comment: occasionally   Drug use: No   Sexual activity: Yes    Birth control/protection: None  Other Topics Concern   Not on file  Social History Narrative   Currently unemployed --- looking for a job    Social Determinants of Radio broadcast assistant Strain: Not on file  Food Insecurity: Not on file  Transportation Needs: Not on file  Physical Activity: Not on file  Stress: Not on file  Social Connections:  Not on file  Intimate Partner Violence: Not on file    Outpatient Medications Prior to Visit  Medication Sig Dispense Refill   lansoprazole (PREVACID) 30 MG capsule Take 1 capsule (30 mg total) by mouth daily at 12 noon. 90 capsule 3   linaclotide (LINZESS) 145 MCG CAPS capsule Take 1 capsule (145 mcg total) by mouth daily before breakfast. 30 capsule 3   LINZESS 72 MCG capsule TAKE 1 CAPSULE BY MOUTH EVERY DAY BEFORE BREAKFAST 30 capsule 2   norethindrone (AYGESTIN) 5 MG tablet Take 1 tablet (5 mg total) by mouth in the morning and at bedtime. 60 tablet 0   ondansetron (ZOFRAN) 4 MG tablet Take 1 tablet (4 mg total) by mouth daily at 2 PM. As needed 30 tablet 1   valACYclovir (VALTREX) 1000 MG tablet TAKE 1 TABLET TWICE A DAY FOR 3 DAYS WITH SYMPTOM ONSET 30 tablet 0   Liraglutide -Weight Management (SAXENDA) 18 MG/3ML SOPN 3 mg  sq daily 15 mL 2   No facility-administered medications prior to visit.    Allergies  Allergen Reactions   Aspirin     REACTION: gi upset   Doxycycline Nausea And Vomiting   Latex    Sulfonamide Derivatives     Review of Systems  Constitutional:  Negative for fever and malaise/fatigue.  HENT:  Negative for congestion.   Eyes:  Negative for blurred vision.  Respiratory:  Negative for cough and shortness of breath.   Cardiovascular:  Negative for chest pain, palpitations and leg swelling.  Gastrointestinal:  Negative for nausea and vomiting.  Musculoskeletal:  Positive for back pain (Radiates to Leg and Abdomen).       (+) Lump Lower Left Side of Back  Skin:  Negative for rash.  Neurological:  Negative for loss of consciousness and headaches.      Objective:    Physical Exam Vitals reviewed.  Constitutional:      General: She is not in acute distress.    Appearance: Normal appearance. She is not ill-appearing.  HENT:     Head: Normocephalic and atraumatic.     Right Ear: External ear normal.     Left Ear: External ear normal.  Eyes:      Extraocular Movements: Extraocular movements intact.     Pupils: Pupils are equal, round, and reactive to light.  Cardiovascular:     Rate and Rhythm: Normal rate and regular rhythm.     Heart sounds: Normal heart sounds. No murmur heard.   No gallop.  Pulmonary:     Effort: Pulmonary effort is normal. No respiratory distress.     Breath sounds: Normal breath  sounds. No wheezing or rales.  Skin:    General: Skin is warm and dry.  Neurological:     Mental Status: She is alert and oriented to person, place, and time.  Psychiatric:        Judgment: Judgment normal.    BP 118/80 (BP Location: Left Arm, Patient Position: Sitting, Cuff Size: Normal)   Pulse (!) 103   Temp 97.7 F (36.5 C) (Oral)   Resp 18   Ht 5' 7"  (1.702 m)   Wt 155 lb 12.8 oz (70.7 kg)   SpO2 99%   BMI 24.40 kg/m  Wt Readings from Last 3 Encounters:  01/08/22 155 lb 12.8 oz (70.7 kg)  10/09/21 164 lb 6.4 oz (74.6 kg)  10/01/21 166 lb (75.3 kg)    Diabetic Foot Exam - Simple   No data filed    Lab Results  Component Value Date   WBC 7.9 07/19/2021   HGB 15.4 (H) 07/19/2021   HCT 45.4 07/19/2021   PLT 299 07/19/2021   GLUCOSE 95 07/19/2021   CHOL 323 (H) 06/19/2021   TRIG 65 06/19/2021   HDL 93 06/19/2021   LDLCALC 221 (H) 06/19/2021   ALT 24 07/19/2021   AST 25 07/19/2021   NA 137 07/19/2021   K 4.0 07/19/2021   CL 107 07/19/2021   CREATININE 0.81 07/19/2021   BUN 11 07/19/2021   CO2 17 (L) 07/19/2021   TSH 2.750 06/19/2021   HGBA1C 5.2 03/05/2020    Lab Results  Component Value Date   TSH 2.750 06/19/2021   Lab Results  Component Value Date   WBC 7.9 07/19/2021   HGB 15.4 (H) 07/19/2021   HCT 45.4 07/19/2021   MCV 99.1 07/19/2021   PLT 299 07/19/2021   Lab Results  Component Value Date   NA 137 07/19/2021   K 4.0 07/19/2021   CO2 17 (L) 07/19/2021   GLUCOSE 95 07/19/2021   BUN 11 07/19/2021   CREATININE 0.81 07/19/2021   BILITOT 0.8 07/19/2021   ALKPHOS 48 07/19/2021    AST 25 07/19/2021   ALT 24 07/19/2021   PROT 7.9 07/19/2021   ALBUMIN 4.6 07/19/2021   CALCIUM 10.1 07/19/2021   ANIONGAP 13 07/19/2021   EGFR 101 06/19/2021   GFR 83.11 06/02/2019   Lab Results  Component Value Date   CHOL 323 (H) 06/19/2021   Lab Results  Component Value Date   HDL 93 06/19/2021   Lab Results  Component Value Date   LDLCALC 221 (H) 06/19/2021   Lab Results  Component Value Date   TRIG 65 06/19/2021   Lab Results  Component Value Date   CHOLHDL 3.5 06/19/2021   Lab Results  Component Value Date   HGBA1C 5.2 03/05/2020       Assessment & Plan:   Problem List Items Addressed This Visit       Unprioritized   Mild persistent asthma without complication   Relevant Medications   Liraglutide -Weight Management (SAXENDA) 18 MG/3ML SOPN   Overweight with body mass index (BMI) of 27 to 27.9 in adult    Doing well with saxenda  rto 3 months       Relevant Medications   Liraglutide -Weight Management (SAXENDA) 18 MG/3ML SOPN   Hyperlipidemia    Encourage heart healthy diet such as MIND or DASH diet, increase exercise, avoid trans fats, simple carbohydrates and processed foods, consider a krill or fish or flaxseed oil cap daily.  Relevant Orders   Lipid panel   Other Visit Diagnoses     High risk medication use    -  Primary   Relevant Orders   Comprehensive metabolic panel        Meds ordered this encounter  Medications   Liraglutide -Weight Management (SAXENDA) 18 MG/3ML SOPN    Sig: 3 mg  sq daily    Dispense:  15 mL    Refill:  2    I, Ann Held, DO, personally preformed the services described in this documentation.  All medical record entries made by the scribe were at my direction and in my presence.  I have reviewed the chart and discharge instructions (if applicable) and agree that the record reflects my personal performance and is accurate and complete. 01/08/2022   I,Amber Collins,acting as a scribe for  Ann Held, DO.,have documented all relevant documentation on the behalf of Ann Held, DO,as directed by  Ann Held, DO while in the presence of Ann Held, DO.      Ann Held, DO

## 2022-01-08 NOTE — Assessment & Plan Note (Signed)
Encourage heart healthy diet such as MIND or DASH diet, increase exercise, avoid trans fats, simple carbohydrates and processed foods, consider a krill or fish or flaxseed oil cap daily.  °

## 2022-01-09 LAB — LIPID PANEL
Cholesterol: 263 mg/dL — ABNORMAL HIGH (ref 0–200)
HDL: 81.4 mg/dL (ref >39.00)
LDL Cholesterol: 171 mg/dL — ABNORMAL HIGH (ref 0–99)
NonHDL: 181.93
Total CHOL/HDL Ratio: 3
Triglycerides: 55 mg/dL (ref 0.0–149.0)
VLDL: 11 mg/dL (ref 0.0–40.0)

## 2022-01-09 LAB — COMPREHENSIVE METABOLIC PANEL
ALT: 20 U/L (ref 0–35)
AST: 21 U/L (ref 0–37)
Albumin: 4.6 g/dL (ref 3.5–5.2)
Alkaline Phosphatase: 55 U/L (ref 39–117)
BUN: 13 mg/dL (ref 6–23)
CO2: 27 mEq/L (ref 19–32)
Calcium: 9.8 mg/dL (ref 8.4–10.5)
Chloride: 102 mEq/L (ref 96–112)
Creatinine, Ser: 0.71 mg/dL (ref 0.40–1.20)
GFR: 97.7 mL/min (ref >60.00)
Glucose, Bld: 87 mg/dL (ref 70–99)
Potassium: 4.4 mEq/L (ref 3.5–5.1)
Sodium: 137 mEq/L (ref 135–145)
Total Bilirubin: 0.8 mg/dL (ref 0.2–1.2)
Total Protein: 7.6 g/dL (ref 6.0–8.3)

## 2022-01-11 ENCOUNTER — Other Ambulatory Visit: Payer: Self-pay | Admitting: Gastroenterology

## 2022-01-11 DIAGNOSIS — K5901 Slow transit constipation: Secondary | ICD-10-CM

## 2022-01-22 ENCOUNTER — Other Ambulatory Visit: Payer: Self-pay | Admitting: Family Medicine

## 2022-01-22 ENCOUNTER — Telehealth: Payer: Self-pay | Admitting: Family Medicine

## 2022-01-22 DIAGNOSIS — J4520 Mild intermittent asthma, uncomplicated: Secondary | ICD-10-CM

## 2022-01-22 MED ORDER — QVAR REDIHALER 40 MCG/ACT IN AERB: 2.0000 | INHALATION_SPRAY | Freq: Two times a day (BID) | RESPIRATORY_TRACT | 5 refills | Status: DC

## 2022-01-22 MED ORDER — ALBUTEROL SULFATE HFA 108 (90 BASE) MCG/ACT IN AERS: INHALATION_SPRAY | RESPIRATORY_TRACT | 0 refills | Status: DC

## 2022-01-22 NOTE — Telephone Encounter (Signed)
Spoke w/ Pt's husband called wanting to get refills for Qvar and albuterol- informed we did receive a call from Pt and she was sent to our triage team d/t [facility], she was unable to complete sentences with out sounding out of breath. She was triaged to the ED via EMS but Pt refused, a telephone note was sent to Dr. Etter Sjogren to see about sending these medications in. Pt's husband mentioned Pt only works 3 mins from the pharmacy if she could just get these refilled. Informed again that the message has been sent but can take several hours or even days for PCP to answer as she is in clinic seeing other patients. I again agreed w/ triage nurse that she should go to ED and I informed him of this instead of waiting for the refills. He stated that he works 20 mins from his wife and worried about what could happen in the time to get her from work to take her to ED- recommended Pt could call EMS. He again declined. He also mentioned he is upset because his wife was hung up on by triage nurse because Pt stated some inappropriate things- I informed him that I'm unsure about that conversation but that we do have our rights to warn Pt's about language and that we will disconnect call if language continues. He wanted to know why we couldn't send her refills- I informed that her inhalers are no longer active on her med list- he wanted to know why they weren't on the med list and I told him I'm unsure. While on the phone he did receive a text from [facility] that inhalers had been sent in by Dr. Etter Sjogren. Recommendations given to Pt's husband that if her sx's do not improve w/ inhalers I do recommend she call EMS to proceed to ED. He verbalized understanding.

## 2022-01-22 NOTE — Telephone Encounter (Signed)
Noted  

## 2022-01-22 NOTE — Telephone Encounter (Signed)
Nurse Assessment Nurse: Radford Pax, RN, Eugene Garnet Date/Time (Eastern Time): 01/22/2022 9:52:11 AM Confirm and document reason for call. If symptomatic, describe symptoms. ---Caller is out of breath on phone. States that she is having a hard time breathing. Requests inhaler medication. [address] [facility] Does the patient have any new or worsening symptoms? ---Yes Will a triage be completed? ---No Select reason for no triage. ---Patient declined Please document clinical information provided and list any resource used. ---Attempted to triage pt's symptoms but she declined. Advised that we may want to have her call EMS since she is SOB. Cursed at nurse and continued to request that her medication be sent in. Advised that office would be notified. Disp. Time Eilene Ghazi Time) Disposition Final User 01/22/2022 9:48:52 AM Send to Urgent Queue Dara Hoyer 01/22/2022 9:58:37 AM Clinical Call Yes Radford Pax, RN, Eugene Garnet Comments User: Susie Cassette, RN Date/Time Eilene Ghazi Time): 01/22/2022 9:59:41 AM PLEASE NOTE: All timestamps contained within this report are represented as [place] Standard Time. CONFIDENTIALTY NOTICE: This fax transmission is intended only for the addressee. It contains information that is legally privileged, confidential or otherwise protected from use or disclosure. If you are not the intended recipient, you are strictly prohibited from reviewing, disclosing, copying using or disseminating any of this information or taking any action in reliance on or regarding this information. If you have received this fax in error, please notify us immediately by telephone so that we can arrange for its return to [name]. Phone: [phone], Toll-Free: [phone], Fax: [phone] Page: 2 of 2 Call Id: [id number] Comments Notified Jarrett Soho in office that caller declines triage, declines to call EMS and is wanting only her medication sent in. Jarrett Soho states she will notate in pt's chart

## 2022-01-22 NOTE — Telephone Encounter (Signed)
Triage called stating pt denied any appointment or going to ED for SOB , patient just wants inhalers sent in .

## 2022-01-22 NOTE — Telephone Encounter (Signed)
Patient called with shortness of breath. Warm transferred to triage and explained to them that I could tell the patient is physically struggling with every word spoken. Patient stated she needs inhaler rx but there's no prescription on file

## 2022-01-28 ENCOUNTER — Ambulatory Visit (INDEPENDENT_AMBULATORY_CARE_PROVIDER_SITE_OTHER): Payer: No Typology Code available for payment source

## 2022-01-28 ENCOUNTER — Ambulatory Visit (INDEPENDENT_AMBULATORY_CARE_PROVIDER_SITE_OTHER): Payer: No Typology Code available for payment source | Admitting: Orthopedic Surgery

## 2022-01-28 DIAGNOSIS — M5416 Radiculopathy, lumbar region: Secondary | ICD-10-CM | POA: Diagnosis not present

## 2022-01-30 ENCOUNTER — Encounter: Payer: Self-pay | Admitting: Orthopedic Surgery

## 2022-02-17 ENCOUNTER — Ambulatory Visit
Admission: RE | Admit: 2022-02-17 | Discharge: 2022-02-17 | Disposition: A | Discharge status: Home / Self Care | Payer: No Typology Code available for payment source | Source: Ambulatory Visit | Attending: Orthopedic Surgery | Admitting: Orthopedic Surgery

## 2022-02-17 DIAGNOSIS — M5416 Radiculopathy, lumbar region: Secondary | ICD-10-CM

## 2022-02-18 ENCOUNTER — Other Ambulatory Visit: Payer: Self-pay | Admitting: Family Medicine

## 2022-02-18 DIAGNOSIS — J4520 Mild intermittent asthma, uncomplicated: Secondary | ICD-10-CM

## 2022-02-23 ENCOUNTER — Ambulatory Visit: Payer: No Typology Code available for payment source | Admitting: Orthopedic Surgery

## 2022-02-23 ENCOUNTER — Telehealth: Payer: Self-pay

## 2022-02-23 DIAGNOSIS — M5416 Radiculopathy, lumbar region: Secondary | ICD-10-CM

## 2022-02-23 NOTE — Telephone Encounter (Signed)
Patient had appt today to review MRI but could not wait. Can you call with results? [phone]

## 2022-02-24 NOTE — Telephone Encounter (Signed)
I called lmom about scan pls send to fn for esi lsp l radic thx

## 2022-02-25 NOTE — Addendum Note (Signed)
Addended byLaurann Montana on: 02/25/2022 11:08 AM   Modules accepted: Orders

## 2022-03-11 ENCOUNTER — Ambulatory Visit: Payer: No Typology Code available for payment source | Admitting: Orthopedic Surgery

## 2022-03-11 ENCOUNTER — Telehealth: Payer: Self-pay | Admitting: Family Medicine

## 2022-03-11 NOTE — Telephone Encounter (Signed)
Patient states Kirke Shaggy is on back order everywhere and her pharmacist advised her to change to [facility] instead. She would like to start taking mountjaro. Please advise.

## 2022-03-12 ENCOUNTER — Other Ambulatory Visit: Payer: Self-pay | Admitting: Gastroenterology

## 2022-03-13 NOTE — Telephone Encounter (Signed)
Pt called to follow up on progress of prescribing mounjaro as an alternative to saxenda being on backorder.

## 2022-03-16 ENCOUNTER — Telehealth (HOSPITAL_BASED_OUTPATIENT_CLINIC_OR_DEPARTMENT_OTHER): Payer: Self-pay | Admitting: *Deleted

## 2022-03-16 DIAGNOSIS — B009 Herpesviral infection, unspecified: Secondary | ICD-10-CM

## 2022-03-16 MED ORDER — VALACYCLOVIR HCL 1 G PO TABS: ORAL_TABLET | ORAL | 3 refills | Status: DC

## 2022-03-16 NOTE — Telephone Encounter (Signed)
Pt called and questioned why a refill for Valtrex was denied. Upon review of the chart, I see no refill requests from her [facility].  She said this is the second time she has had it denied. The patient is current for her annual and has the next apt scheduled. Valtrex prescription sent to pharmacy with refills til November [place]

## 2022-03-16 NOTE — Telephone Encounter (Signed)
Pt called today as well to see if there is an update, she stated pharmacy has ozempic as well. Please advise.

## 2022-03-17 ENCOUNTER — Encounter: Payer: Self-pay | Admitting: Physical Medicine and Rehabilitation

## 2022-03-17 ENCOUNTER — Ambulatory Visit (INDEPENDENT_AMBULATORY_CARE_PROVIDER_SITE_OTHER): Payer: No Typology Code available for payment source | Admitting: Physical Medicine and Rehabilitation

## 2022-03-17 VITALS — BP 120/78 | HR 121

## 2022-03-17 DIAGNOSIS — M4726 Other spondylosis with radiculopathy, lumbar region: Secondary | ICD-10-CM | POA: Diagnosis not present

## 2022-03-17 DIAGNOSIS — M5416 Radiculopathy, lumbar region: Secondary | ICD-10-CM

## 2022-03-17 DIAGNOSIS — M5442 Lumbago with sciatica, left side: Secondary | ICD-10-CM

## 2022-03-17 DIAGNOSIS — M5116 Intervertebral disc disorders with radiculopathy, lumbar region: Secondary | ICD-10-CM | POA: Diagnosis not present

## 2022-03-17 DIAGNOSIS — G8929 Other chronic pain: Secondary | ICD-10-CM

## 2022-03-17 NOTE — Telephone Encounter (Signed)
Spoke with patient regarding medication request.  Per PCP, advised patient without a diagnosis of Diabetes, Mounjaro and Ozempic cannot be prescribed.  Advised that Mancel Parsons should be more readily available in the upcoming weeks. Encouraged to call pharmacy and be put on wait list if that is a possibility.  Patient verbalized understanding.

## 2022-03-17 NOTE — Progress Notes (Signed)
Pt state lower back pain that travels down her left leg. Pt state standing, sitting and laying down makes the pain worse. Pt state she takes over the counter pain meds to help ease her pain.  Numeric Pain Rating Scale and Functional Assessment Average Pain 9 Pain Right Now 2 My pain is intermittent, constant, sharp, dull, stabbing, tingling, and aching Pain is worse with: walking, bending, sitting, standing, and some activites Pain improves with: rest and medication   In the last MONTH (on 0-10 scale) has pain interfered with the following?  1. General activity like being  able to carry out your everyday physical activities such as walking, climbing stairs, carrying groceries, or moving a chair?  Rating(5)  2. Relation with others like being able to carry out your usual social activities and roles such as  activities at home, at work and in your community. Rating(6)  3. Enjoyment of life such that you have  been bothered by emotional problems such as feeling anxious, depressed or irritable?  Rating(7)

## 2022-03-17 NOTE — Telephone Encounter (Signed)
LM requesting call back to discuss medication request.

## 2022-03-17 NOTE — Progress Notes (Signed)
[name] - 51 y.o. female MRN [id number]  Date of birth: 1970-09-11  Office Visit Note: Visit Date: 03/17/2022 PCP: Ann Held, DO Referred by: Ann Held, *  Subjective: Chief Complaint  Patient presents with   Lower Back - Pain   Right Leg - Pain   Left Leg - Pain   HPI: [name] [name] is a 51 y.o. female who comes in today per the request of Dr. Alphonzo Severance for evaluation of chronic, worsening and severe left sided lower back pain radiating to buttock and down left anterolateral thigh to knee. Pain ongoing for 3 years, became significantly worse after Christmas this year. She reports pain is exacerbated by prolonged standing, sitting and walking. She describes pain as sore and aching sensation, currently rates as 2 out of 10. Patient reports some relief of pain with home exercise regimen, heating pad, rest and use of medications. Patient reports some relief of pain with Voltaren gel. Patients recent lumbar MRI imaging exhibits small left foraminal protrusion and annular fissure at L3-L4, no high grade spinal canal stenosis noted. Patient has no history of lumbar injections/lumbar surgery. Patient states her pain has become so severe that she is not able to jog/run or exercise. Patient states she would like to discuss treatment options and injection procedure today. Patient denies focal weakness, numbness and tingling. Patient denies recent trauma or falls.    Oswestry Disability Index Score 24% 10 to 20 (40%) moderate disability: The patient experiences more pain and difficulty with sitting, lifting and standing. Travel and social life are more difficult, and they may be disabled from work. Personal care, sexual activity and sleeping are not grossly affected, and the patient can usually be managed by conservative means.   Review of Systems  Musculoskeletal:  Positive for back pain.  Neurological:  Negative for tingling, sensory change, focal weakness and  weakness.  All other systems reviewed and are negative.  Otherwise per HPI.  Assessment & Plan: Visit Diagnoses:    ICD-10-CM   1. Lumbar radiculopathy  M54.16     2. Chronic left-sided low back pain with left-sided sciatica  M54.42    G89.29     3. Intervertebral disc disorders with radiculopathy, lumbar region  M51.16     4. Other spondylosis with radiculopathy, lumbar region  M47.26        Plan: Findings:  Chronic, worsening and severe left sided lower back pain radiating to buttock and down left anterolateral thigh to knee. Patient continues to have severe pain despite good conservative therapies such as home exercise regimen, rest, heating pad and medications. I did review recent lumbar MRI using imaging and spine model. Patients clinical presentation and exam are consistent left L3 nerve pattern. Next step is to perform diagnostic and hopefully therapeutic left L3 transforaminal epidural steroid injection under fluoroscopic guidance. I did discuss lumbar epidural steroid injection procedure in detail today and provided her with educational material to review at home. Patient voiced concerns about anxiety related to procedure, I did inform her that I am happy to prescribe pre-procedure sedation to take the day of injection. Patient would like to hold on injection at this time and will let us know if she would like to proceed. No red flag symptoms noted upon exam today.     Meds & Orders: No orders of the defined types were placed in this encounter.  No orders of the defined types were placed in this encounter.   Follow-up:  Return if symptoms worsen or fail to improve.   Procedures: No procedures performed      Clinical History: EXAM: MRI LUMBAR SPINE WITHOUT CONTRAST   TECHNIQUE: Multiplanar, multisequence MR imaging of the lumbar spine was performed. No intravenous contrast was administered.   COMPARISON:  None Available.   FINDINGS: Segmentation:  Standard.    Alignment:  Physiologic.   Vertebrae:  No fracture, evidence of discitis, or bone lesion.   Conus medullaris and cauda equina: Conus extends to the L1-2 level. Conus and cauda equina appear normal.   Paraspinal and other soft tissues: Negative for perispinal mass or inflammation   Disc levels:   L3-L4: Disc desiccation and narrowing with left foraminal protrusion at an annular fissure that may contact but does not compress the L3 nerve root.   L4-L5: Mild disc bulging   L5-S1:Disc narrowing and bulging mild endplate and facet spurring. No neural impingement   IMPRESSION: 1. 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 2. L3-4 small left foraminal protrusion and annular fissure but no nerve root compression at this level or throughout the lumbar spine.     Electronically Signed   By: Jorje Guild M.D.   On: 02/18/2022 21:05   She reports that she has never smoked. She has never used smokeless tobacco. No results for input(s): "HGBA1C", "LABURIC" in the last 8760 hours.  Objective:  VS:  HT:    WT:   BMI:     BP:120/78  HR:(!) 121bpm  TEMP: ( )  RESP:  Physical Exam Vitals and nursing note reviewed.  HENT:     Head: Normocephalic and atraumatic.     Right Ear: External ear normal.     Left Ear: External ear normal.     Nose: Nose normal.     Mouth/Throat:     Mouth: Mucous membranes are moist.  Eyes:     Pupils: Pupils are equal, round, and reactive to light.  Cardiovascular:     Rate and Rhythm: Normal rate.     Pulses: Normal pulses.  Pulmonary:     Effort: Pulmonary effort is normal.  Abdominal:     General: Abdomen is flat. There is no distension.  Musculoskeletal:        General: Normal range of motion.     Cervical back: Normal range of motion.     Comments: Pt rises from seated position to standing without difficulty. Good lumbar range of motion. Strong distal strength without clonus, no pain upon palpation of greater trochanters. Dysesthesias noted to  left L3 dermatome. Sensation intact bilaterally. Walks independently, gait steady.   Skin:    General: Skin is warm and dry.     Capillary Refill: Capillary refill takes less than 2 seconds.  Neurological:     General: No focal deficit present.     Mental Status: She is alert and oriented to person, place, and time.  Psychiatric:        Mood and Affect: Mood normal.        Behavior: Behavior normal.     Ortho Exam  Imaging: No results found.  Past Medical/Family/Surgical/Social History: Medications & Allergies reviewed per EMR, new medications updated. Patient Active Problem List   Diagnosis Date Noted   Overweight with body mass index (BMI) of 27 to 27.9 in adult 07/01/2021   Mild persistent asthma without complication 62/22/9798   Hyperlipidemia 07/01/2021   Mild intermittent asthma 03/19/2020   Overweight (BMI 25.0-29.9) 03/19/2020   Preventative health care 06/02/2019  Weight gain 06/02/2019   Nausea 06/02/2019   Adenomyosis 05/19/2019   INFECTIOUS COLITIS ENTERITIS AND GASTROENTERITIS 05/14/2009   Slow transit constipation 05/14/2009   IRRITABLE BOWEL SYNDROME 05/14/2009   INTERNAL THROMBOSED HEMORRHOIDS 12/31/2007   Past Medical History:  Diagnosis Date   Abnormal Pap smear, atypical squamous cells of undetermined sign (ASC-US) 10/98   CIN 1   Allergy    Asthma    Atypical nevus 07/29/2010   Right Medial Buttock-Moderate   Atypical nevus x 2 11/15/2006   Upper Right Back-Moderate and Lower Mid Back-Moderate to Marked (w/s)   Atypical nevus x 3 11/04/2007   Right Side-Slight to Moderate, Right Upper Side-Moderate, and Left Upper Breast-Moderate   Atypical nevus x 7 10/15/2006   Right Medial Hip-Moderate, Right Middle Hip-Slight, Right Lateral Hip-Mild, Right Axillary Vault-Moderate(w/s), Right Post Calf-Moderate(w/s),Right Inner Buttock-Slight, and Left Mid Back-Moderate(w/s)   Condyloma acuminata 2001   Endometriosis 8/95   DepoLupron 4/96-10/96   Hiatal  hernia    History of campylobacteriosis    HSV-2 infection    Hyperlipidemia    IBS (irritable bowel syndrome)    Internal thrombosed hemorrhoids    Migraine    without aura   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   Right Upper Arm (tx p bx)   Family History  Problem Relation Age of Onset   Irritable bowel syndrome Mother    Hernia Mother    Ulcers Maternal Grandfather        gastrectomy   Leukemia Maternal Grandfather    Diabetes Maternal Grandfather    Prostate cancer Maternal Grandfather    Diabetes Maternal Grandmother    Coronary artery disease Maternal Grandmother    Stroke Maternal Grandmother    Heart failure Maternal Grandmother    Colon cancer Neg Hx    Esophageal cancer Neg Hx    Liver disease Neg Hx    Pancreatic cancer Neg Hx    Stomach cancer Neg Hx    Past Surgical History:  Procedure Laterality Date   AUGMENTATION MAMMAPLASTY Bilateral    BREAST BIOPSY Left    benign   BREAST ENHANCEMENT SURGERY  02/2000   BREAST MASS EXCISION Right 12/01   fibroadenoma   EXPLORATORY LAPAROTOMY  8/95   endometriosis   Social History   Occupational History    Employer: [employer]  Tobacco Use   Smoking status: Never   Smokeless tobacco: Never  Vaping Use   Vaping Use: Never used  Substance and Sexual Activity   Alcohol use: Yes    Comment: occasionally   Drug use: No   Sexual activity: Yes    Birth control/protection: None

## 2022-03-17 NOTE — Telephone Encounter (Signed)
Spoke with patient. Pt states she paying for these medications out of pocket anyway. Pt was advised that these meds are for DM not just weight loss. Pt states she will get one of these medications from someone else. FYI

## 2022-03-20 ENCOUNTER — Other Ambulatory Visit: Payer: Self-pay | Admitting: Family Medicine

## 2022-03-20 MED ORDER — WEGOVY 1 MG/0.5ML ~~LOC~~ SOAJ: 1.0000 mg | SUBCUTANEOUS | 0 refills | Status: DC

## 2022-03-20 NOTE — Telephone Encounter (Signed)
Patient requesting Mancel Parsons prescription to be sent in so she can be placed on wait list at pharmacy.  She is requesting to keep the [facility] active as well so she can receive which ever medication is available first.   [facility] [address].

## 2022-04-27 ENCOUNTER — Other Ambulatory Visit: Payer: Self-pay | Admitting: Gastroenterology

## 2022-04-27 DIAGNOSIS — K5901 Slow transit constipation: Secondary | ICD-10-CM

## 2022-05-05 ENCOUNTER — Ambulatory Visit: Payer: No Typology Code available for payment source | Admitting: Family Medicine

## 2022-05-07 ENCOUNTER — Ambulatory Visit: Payer: No Typology Code available for payment source | Admitting: Nurse Practitioner

## 2022-05-07 ENCOUNTER — Encounter: Payer: Self-pay | Admitting: Nurse Practitioner

## 2022-05-07 VITALS — BP 120/76 | HR 86

## 2022-05-07 DIAGNOSIS — Z789 Other specified health status: Secondary | ICD-10-CM

## 2022-05-07 NOTE — Progress Notes (Signed)
Subjective:  Patient ID: [name] [name], female    DOB: [birth year]  Age: 51 y.o. MRN: [id number]  CC: wellness exam   HPI [name] [name] presents for wellness exam visit for insurance benefit.  Patient has a PCP: Roma Schanz with [facility] significant for: IBS  Last labs per PCP were completed: June 2023  Health Maintenance:  Colonoscopy: Feb 2023, repeat in 5 years. Mammogram: Nov 2022 PAP: update     Smoker: Never  Immunizations:  Shingrix-  Declines at this time, but interested in the future.   COVID- x 4  Flu: yearly.   Lifestyle: Diet- eats healthy. Drinks a lot of water.  Exercise- walks every day, lifts weights.      Past Medical History:  Diagnosis Date   Abnormal Pap smear, atypical squamous cells of undetermined sign (ASC-US) 10/98   CIN 1   Allergy    Asthma    Atypical nevus 07/29/2010   Right Medial Buttock-Moderate   Atypical nevus x 2 11/15/2006   Upper Right Back-Moderate and Lower Mid Back-Moderate to Marked (w/s)   Atypical nevus x 3 11/04/2007   Right Side-Slight to Moderate, Right Upper Side-Moderate, and Left Upper Breast-Moderate   Atypical nevus x 7 10/15/2006   Right Medial Hip-Moderate, Right Middle Hip-Slight, Right Lateral Hip-Mild, Right Axillary Vault-Moderate(w/s), Right Post Calf-Moderate(w/s),Right Inner Buttock-Slight, and Left Mid Back-Moderate(w/s)   Condyloma acuminata 2001   Endometriosis 8/95   DepoLupron 4/96-10/96   Hiatal hernia    History of campylobacteriosis    HSV-2 infection    Hyperlipidemia    IBS (irritable bowel syndrome)    Internal thrombosed hemorrhoids    Migraine    without aura   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 10.6 g 5   lansoprazole (PREVACID) 30 MG capsule Take 1 capsule (30 mg total) by mouth daily at 12 noon. 90 capsule 3   LINZESS 72 MCG capsule TAKE 1 CAPSULE BY MOUTH EVERY DAY BEFORE BREAKFAST 30 capsule 2   ondansetron (ZOFRAN) 4 MG tablet TAKE 1 TABLET (4 MG TOTAL) BY MOUTH DAILY AT 2 PM. AS NEEDED 24 tablet 2   valACYclovir (VALTREX) 1000 MG tablet TAKE 1 TABLET TWICE A DAY FOR 3 DAYS WITH SYMPTOM ONSET 30 tablet 3   norethindrone (AYGESTIN) 5 MG tablet Take 1 tablet (5 mg total) by mouth in the morning and at bedtime. 60 tablet 0   linaclotide (LINZESS) 145 MCG CAPS capsule Take 1 capsule (145 mcg total) by mouth daily before breakfast. (Patient not taking: Reported on 05/07/2022) 30 capsule 3   Liraglutide -Weight Management (SAXENDA) 18 MG/3ML SOPN 3 mg  sq daily (Patient not taking: Reported on 05/07/2022) 15 mL 2   Semaglutide-Weight Management (WEGOVY) 1 MG/0.5ML SOAJ Inject 1 mg into the skin once a week. (Patient not taking: Reported on 05/07/2022)  2 mL 0   No facility-administered medications prior to visit.    ROS Review of Systems  Respiratory:  Negative for shortness of breath.   Cardiovascular:  Negative for chest pain and leg swelling.  Musculoskeletal:  Positive for back pain.  Allergic/Immunologic: Positive for environmental allergies and food allergies.    Objective:  BP 120/76   Pulse 86   SpO2 98%   Physical Exam Constitutional:      General: She is not in acute distress. HENT:     Head: Normocephalic.  Cardiovascular:     Rate and Rhythm: Normal rate and regular rhythm.     Pulses: Normal pulses.     Heart  sounds: Normal heart sounds.  Pulmonary:     Effort: Pulmonary effort is normal.     Breath sounds: Normal breath sounds.  Musculoskeletal:        General: Normal range of motion.     Right lower leg: No edema.     Left lower leg: No edema.  Skin:    General: Skin is warm.  Neurological:     General: No focal deficit present.     Mental Status: She is alert and oriented to person, place, and time.  Psychiatric:        Mood and Affect: Mood normal.        Behavior: Behavior normal.      Assessment & Plan:    Anelly was seen today for wellness exam.  Diagnoses and all orders for this visit:  Participant in health and wellness plan Adult wellness physical was conducted today. Importance of diet and exercise were discussed in detail.  We reviewed immunizations and gave recommendations regarding current immunization needed for age. Discussed shingrix vaccine, patient will let me know if she is interested in obtaining this in the future.  Preventative health exams are up to date.   Patient was advised yearly wellness exam    No orders of the defined types were placed in this encounter.   No orders of the defined types were placed in this encounter.   Follow-up: as needed

## 2022-05-18 ENCOUNTER — Other Ambulatory Visit (HOSPITAL_BASED_OUTPATIENT_CLINIC_OR_DEPARTMENT_OTHER): Payer: Self-pay | Admitting: Obstetrics & Gynecology

## 2022-05-18 DIAGNOSIS — B009 Herpesviral infection, unspecified: Secondary | ICD-10-CM

## 2022-06-04 ENCOUNTER — Other Ambulatory Visit: Payer: Self-pay

## 2022-06-04 ENCOUNTER — Telehealth: Payer: Self-pay | Admitting: Gastroenterology

## 2022-06-04 MED ORDER — LANSOPRAZOLE 30 MG PO CPDR: 30.0000 mg | DELAYED_RELEASE_CAPSULE | Freq: Every day | ORAL | 3 refills | Status: DC

## 2022-06-04 NOTE — Telephone Encounter (Signed)
Inbound call from patient stating she needs medication refill for Lansoprazole medication. Please advise.

## 2022-06-04 NOTE — Telephone Encounter (Signed)
Refill sent to patients pharmacy. 

## 2022-07-03 ENCOUNTER — Other Ambulatory Visit (HOSPITAL_COMMUNITY): Payer: Self-pay

## 2022-07-03 ENCOUNTER — Telehealth: Payer: Self-pay | Admitting: Pharmacy Technician

## 2022-07-03 NOTE — Telephone Encounter (Signed)
Patient Advocate Encounter  Received notification from [facility] that prior authorization for [facility] is required.   PA submitted on 11.24.23 Key [id number] Status is pending

## 2022-07-09 ENCOUNTER — Encounter (HOSPITAL_BASED_OUTPATIENT_CLINIC_OR_DEPARTMENT_OTHER): Payer: Self-pay | Admitting: Obstetrics & Gynecology

## 2022-07-09 ENCOUNTER — Ambulatory Visit (INDEPENDENT_AMBULATORY_CARE_PROVIDER_SITE_OTHER): Payer: No Typology Code available for payment source | Admitting: Obstetrics & Gynecology

## 2022-07-09 VITALS — BP 127/74 | HR 86 | Ht 67.0 in | Wt 155.6 lb

## 2022-07-09 DIAGNOSIS — Z1231 Encounter for screening mammogram for malignant neoplasm of breast: Secondary | ICD-10-CM

## 2022-07-09 DIAGNOSIS — Z01419 Encounter for gynecological examination (general) (routine) without abnormal findings: Secondary | ICD-10-CM

## 2022-07-09 DIAGNOSIS — B009 Herpesviral infection, unspecified: Secondary | ICD-10-CM | POA: Diagnosis not present

## 2022-07-09 DIAGNOSIS — Z8742 Personal history of other diseases of the female genital tract: Secondary | ICD-10-CM

## 2022-07-09 NOTE — Progress Notes (Signed)
51 y.o. G0P0000 Married White or Caucasian female here for annual exam.  Has lost weight this year.  Used Saxenda.  Has been off for several months.  Weight pretty stable.    Had a bulging disc in July.  She's following this conservatively.  Symptoms are much better.  Cycles have been more regular this past year.         Sexually active: Yes.    The current method of family planning is none.    Smoker:  no  Health Maintenance: Pap:  03/05/2020 Negative History of abnormal Pap:  > 10 years MMG:  06/30/2021 Negative Colonoscopy:  10/01/2021, follow up 5 year Screening Labs: done 01/2022   reports that she has never smoked. She has never used smokeless tobacco. She reports current alcohol use. She reports that she does not use drugs.  Past Medical History:  Diagnosis Date   Abnormal Pap smear, atypical squamous cells of undetermined sign (ASC-US) 10/98   CIN 1   Allergy    Asthma    Atypical nevus 07/29/2010   Right Medial Buttock-Moderate   Atypical nevus x 2 11/15/2006   Upper Right Back-Moderate and Lower Mid Back-Moderate to Marked (w/s)   Atypical nevus x 3 11/04/2007   Right Side-Slight to Moderate, Right Upper Side-Moderate, and Left Upper Breast-Moderate   Atypical nevus x 7 10/15/2006   Right Medial Hip-Moderate, Right Middle Hip-Slight, Right Lateral Hip-Mild, Right Axillary Vault-Moderate(w/s), Right Post Calf-Moderate(w/s),Right Inner Buttock-Slight, and Left Mid Back-Moderate(w/s)   Condyloma acuminata 2001   Endometriosis 8/95   DepoLupron 4/96-10/96   Hiatal hernia    History of campylobacteriosis    HSV-2 infection    Hyperlipidemia    IBS (irritable bowel syndrome)    Internal thrombosed hemorrhoids    Migraine    without aura   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 10.6 g 5   lansoprazole (PREVACID) 30 MG capsule Take 1 capsule (30 mg total) by mouth daily at 12 noon. 90 capsule 3   LINZESS 72 MCG capsule TAKE 1 CAPSULE BY MOUTH EVERY DAY BEFORE BREAKFAST 30 capsule 2   ondansetron (ZOFRAN) 4 MG tablet TAKE 1 TABLET (4 MG TOTAL) BY MOUTH DAILY AT 2 PM. AS NEEDED 24 tablet 2   valACYclovir (VALTREX) 1000 MG tablet TAKE 1 TABLET TWICE A DAY FOR 3 DAYS WITH SYMPTOM ONSET 180 tablet 1   No current facility-administered medications for this visit.    Family History  Problem Relation Age of Onset   Irritable bowel syndrome Mother    Hernia Mother    Ulcers Maternal Grandfather        gastrectomy   Leukemia Maternal Grandfather    Diabetes Maternal Grandfather    Prostate cancer Maternal Grandfather    Diabetes Maternal Grandmother    Coronary artery disease Maternal Grandmother    Stroke Maternal Grandmother  Heart failure Maternal Grandmother    Colon cancer Neg Hx    Esophageal cancer Neg Hx    Liver disease Neg Hx    Pancreatic cancer Neg Hx    Stomach cancer Neg Hx     ROS: Constitutional: negative Genitourinary:negative  Exam:   BP 127/74 (BP Location: Right Arm, Patient Position: Sitting, Cuff Size: Large)   Pulse 86   Ht '5\' 7"'$  (1.702 m) Comment: Reported  Wt 155 lb 9.6 oz (70.6 kg)   BMI 24.37 kg/m   Height: '5\' 7"'$  (170.2 cm) (Reported)  General appearance: alert, cooperative and appears stated age Head: Normocephalic, without obvious abnormality, atraumatic Neck: no adenopathy, supple, symmetrical, trachea midline and  thyroid normal to inspection and palpation Lungs: clear to auscultation bilaterally Breasts: normal appearance, no masses or tenderness Heart: regular rate and rhythm Abdomen: soft, non-tender; bowel sounds normal; no masses,  no organomegaly Extremities: extremities normal, atraumatic, no cyanosis or edema Skin: Skin color, texture, turgor normal. No rashes or lesions Lymph nodes: Cervical, supraclavicular, and axillary nodes normal. No abnormal inguinal nodes palpated Neurologic: Grossly normal   Pelvic: External genitalia:  no lesions              Urethra:  normal appearing urethra with no masses, tenderness or lesions              Bartholins and Skenes: normal                 Vagina: normal appearing vagina with normal color and no discharge, no lesions              Cervix: no lesions              Pap taken: No. Bimanual Exam:  Uterus:  normal size, contour, position, consistency, mobility, non-tender              Adnexa: normal adnexa and no mass, fullness, tenderness               Rectovaginal: Confirms               Anus:  normal sphincter tone, no lesions  Chaperone, Octaviano Batty, CMA, was present for exam.  Assessment/Plan: 1. Well woman exam with routine gynecological exam - Pap smear 03/05/2020 - Mammogram 06/30/2021 - Colonoscopy 10/01/2021, follow up 5 years - Bone mineral density not indicated - lab work done 01/2022 - vaccines reviewed/updated  2. Encounter for screening mammogram for malignant neoplasm of breast - MM 3D SCREEN BREAST BILATERAL; Future  3. History of menorrhagia - bleeding profile much improved this year.  4. 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

## 2022-07-12 DIAGNOSIS — B009 Herpesviral infection, unspecified: Secondary | ICD-10-CM | POA: Insufficient documentation

## 2022-07-13 ENCOUNTER — Other Ambulatory Visit (HOSPITAL_COMMUNITY): Payer: Self-pay

## 2022-07-13 NOTE — Telephone Encounter (Signed)
Patient Advocate Encounter  Prior Authorization for [facility] has been approved.    PA# Request ID: [id number] Effective dates: 11.24.23 through 11.30.24

## 2022-07-15 ENCOUNTER — Telehealth: Payer: Self-pay | Admitting: Gastroenterology

## 2022-07-15 DIAGNOSIS — K5901 Slow transit constipation: Secondary | ICD-10-CM

## 2022-07-15 MED ORDER — LINACLOTIDE 72 MCG PO CAPS: 72.0000 ug | ORAL_CAPSULE | Freq: Every day | ORAL | 5 refills | Status: DC

## 2022-07-15 NOTE — Telephone Encounter (Signed)
Patient requesting refill on linzess. Please advise.

## 2022-07-15 NOTE — Telephone Encounter (Signed)
Refill sent.

## 2022-08-24 ENCOUNTER — Ambulatory Visit (HOSPITAL_BASED_OUTPATIENT_CLINIC_OR_DEPARTMENT_OTHER)
Admission: RE | Admit: 2022-08-24 | Discharge: 2022-08-24 | Disposition: A | Discharge status: Home / Self Care | Payer: No Typology Code available for payment source | Source: Ambulatory Visit | Attending: Obstetrics & Gynecology | Admitting: Obstetrics & Gynecology

## 2022-08-24 ENCOUNTER — Encounter (HOSPITAL_BASED_OUTPATIENT_CLINIC_OR_DEPARTMENT_OTHER): Payer: Self-pay | Admitting: Radiology

## 2022-08-24 DIAGNOSIS — Z1231 Encounter for screening mammogram for malignant neoplasm of breast: Secondary | ICD-10-CM | POA: Diagnosis not present

## 2022-09-03 ENCOUNTER — Encounter: Payer: Self-pay | Admitting: Gastroenterology

## 2022-10-27 ENCOUNTER — Ambulatory Visit: Payer: No Typology Code available for payment source | Admitting: Nurse Practitioner

## 2022-10-27 ENCOUNTER — Encounter: Payer: Self-pay | Admitting: Nurse Practitioner

## 2022-10-27 VITALS — BP 122/76 | HR 97 | Temp 96.6°F

## 2022-10-27 DIAGNOSIS — J0140 Acute pansinusitis, unspecified: Secondary | ICD-10-CM

## 2022-10-27 MED ORDER — FLUCONAZOLE 150 MG PO TABS: 150.0000 mg | ORAL_TABLET | Freq: Once | ORAL | 1 refills | Status: AC

## 2022-10-27 MED ORDER — AMOXICILLIN-POT CLAVULANATE 875-125 MG PO TABS: 1.0000 | ORAL_TABLET | Freq: Two times a day (BID) | ORAL | 0 refills | Status: AC

## 2022-10-27 NOTE — Progress Notes (Signed)
Acute Office Visit  Subjective:     Patient ID: [name] [name], female    DOB: [birth year], 52 y.o.   MRN: [id number]  Chief Complaint  Patient presents with   Sinusitis   Patient presents for c/o of sinus pain. Reports symptoms have been present for 2 weeks ago but she feels the symptoms have worsen over the past 24 hours. Symptoms included throat pain, headache, ocassional cough, and sinus pain/pressure. Feels that her ears are clogged up and also reports that even her upper teeth hurt. She is unsure if she has had fevers  Has been using sudafed and flonase with minimal relief.   Review of Systems  Constitutional:  Negative for fever.  HENT:  Positive for congestion, sinus pain, sore throat and tinnitus (slight). Negative for ear pain.   Eyes: Negative.   Respiratory:  Positive for cough and shortness of breath.   Cardiovascular:  Negative for chest pain.  Gastrointestinal:  Positive for nausea. Negative for constipation, diarrhea and vomiting.  Musculoskeletal:  Negative for myalgias.  Neurological:  Positive for dizziness and headaches.      Objective:    BP 122/76   Pulse 97   Temp (!) 96.6 F (35.9 C)   SpO2 97%    Physical Exam Constitutional:      General: She is not in acute distress. HENT:     Head: Normocephalic.     Right Ear: Tympanic membrane, ear canal and external ear normal.     Left Ear: Tympanic membrane, ear canal and external ear normal.     Nose:     Right Sinus: Maxillary sinus tenderness and frontal sinus tenderness present.     Left Sinus: Maxillary sinus tenderness and frontal sinus tenderness present.     Mouth/Throat:     Pharynx: Posterior oropharyngeal erythema present. No oropharyngeal exudate.  Eyes:     Pupils: Pupils are equal, round, and reactive to light.  Cardiovascular:     Rate and Rhythm: Normal rate.     Heart sounds: Normal heart sounds.  Pulmonary:     Effort: Pulmonary effort is normal. No respiratory distress.   Musculoskeletal:        General: Normal range of motion.  Neurological:     General: No focal deficit present.     Mental Status: She is alert and oriented to person, place, and time.     No results found for any visits on 10/27/22.      Assessment & Plan:   Problem List Items Addressed This Visit   None Visit Diagnoses     Acute non-recurrent pansinusitis    -  Primary   Relevant Medications   fluconazole (DIFLUCAN) 150 MG tablet   amoxicillin-clavulanate (AUGMENTIN) 875-125 MG tablet     Due to length of symptoms will go ahead and treat with antibiotics. She would also like diflucan due to hx of yeast infections with abx use. Encouraged supportive care measures including use of mucinex, flonase, and saline.   Meds ordered this encounter  Medications   fluconazole (DIFLUCAN) 150 MG tablet    Sig: Take 1 tablet (150 mg total) by mouth once for 1 dose. Repeat in 72 hours if symptoms persists.    Dispense:  1 tablet    Refill:  1    Order Specific Question:   Supervising Provider    Answer:   Virginia Crews [id number]   amoxicillin-clavulanate (AUGMENTIN) 875-125 MG tablet    Sig: Take 1 tablet by mouth  2 (two) times daily for 7 days.    Dispense:  14 tablet    Refill:  0    Order Specific Question:   Supervising Provider    Answer:   Virginia Crews [id number]    Follow up as needed  Lurena Joiner, NP

## 2022-10-29 ENCOUNTER — Telehealth: Payer: Self-pay | Admitting: Nurse Practitioner

## 2022-10-29 NOTE — Telephone Encounter (Addendum)
Spoke to patient to follow-up on symptoms. Patient reports starting antibiotics yesterday. However, she reports not going into work today due to feeling unwell and reports she is now having generalized body aches. I recommended she follow-up with urgent care or her PCP if symptoms don't improve within the next 24 hours on current antibiotic or immediately if red flags symptoms present. She verbalized understanding.

## 2022-11-09 NOTE — Addendum Note (Signed)
Encounter addended by: Vinnie Level, RT on: 11/09/2022 1:55 PM  Actions taken: Imaging Exam ended

## 2022-12-11 IMAGING — CT CT ABD-PELV W/ CM
2 of 5 series · 17 of 46 positions shown, 19 images · IV contrast (omnipaque)
Comparison: 07/15/2011

CLINICAL DATA: Bowel obstruction suspected

EXAM:
CT ABDOMEN AND PELVIS WITH CONTRAST
TECHNIQUE: Multidetector CT imaging of the abdomen and pelvis was performed
using the standard protocol following bolus administration of
intravenous contrast.
CONTRAST:  100mL OMNIPAQUE IOHEXOL 300 MG/ML  SOLN

[Series 2: abd pel w · axial · 0.72mm/px · z∈[-500,-85]mm · 14 of 93 slices shown, 16 images]
[im 5/93  soft-tissue]
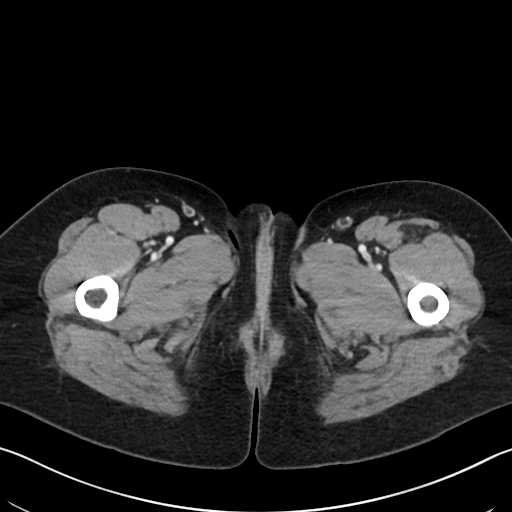
[im 5/93  bone]
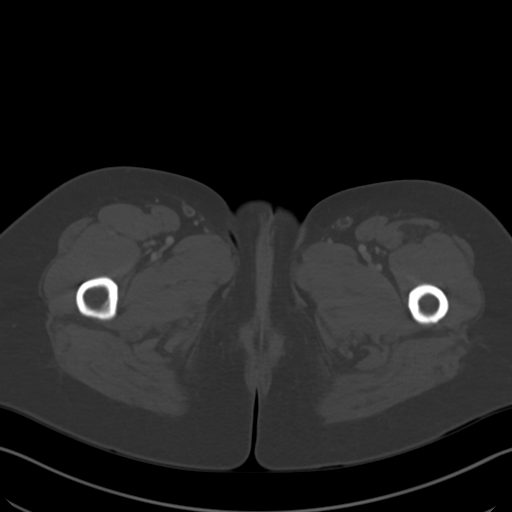
[im 14/93  soft-tissue]
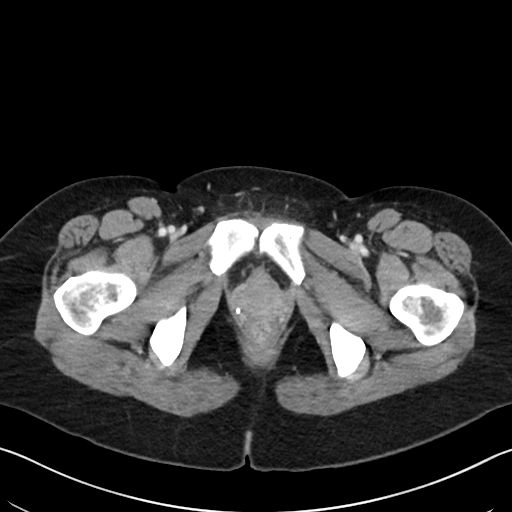
[im 19/93  soft-tissue]
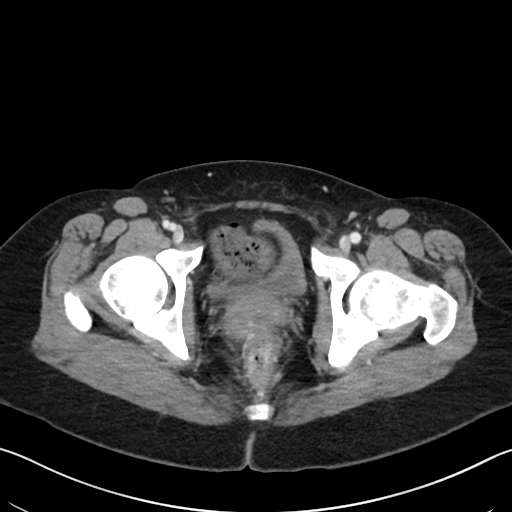
[im 24/93  soft-tissue]
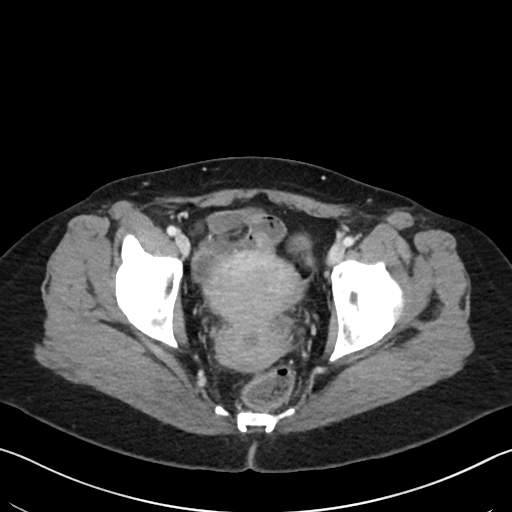
[im 33/93  soft-tissue]
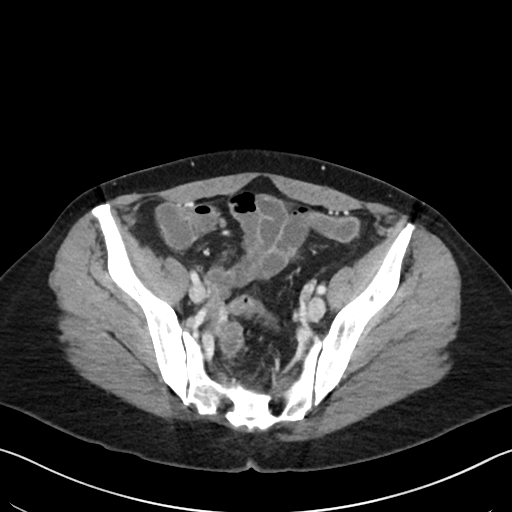
[im 37/93  soft-tissue]
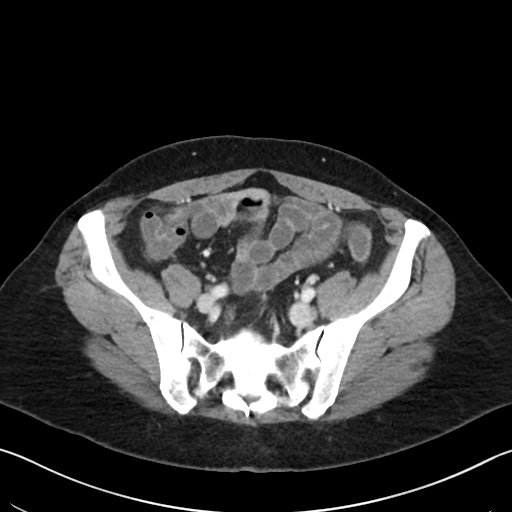
[im 42/93  soft-tissue]
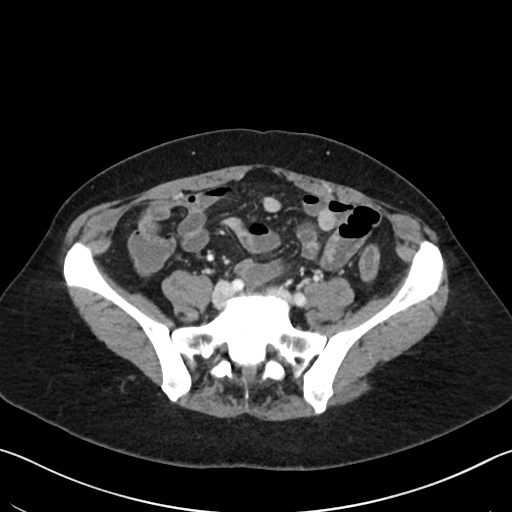
[im 51/93  soft-tissue]
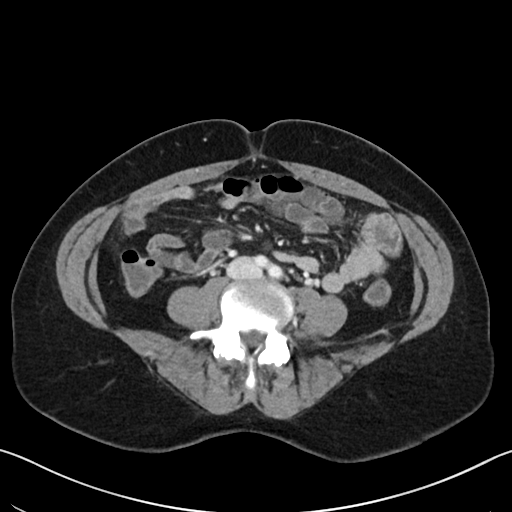
[im 56/93  soft-tissue]
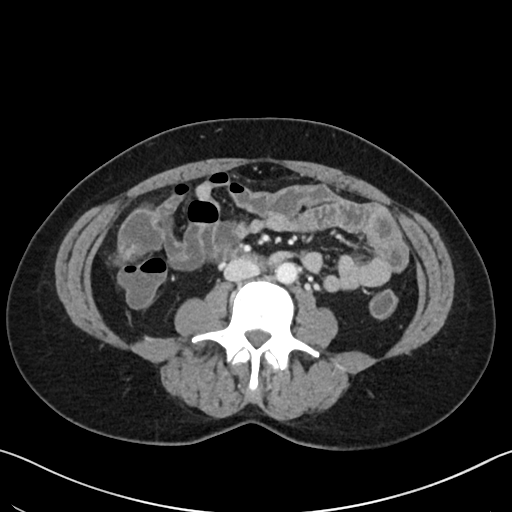
[im 56/93  bone]
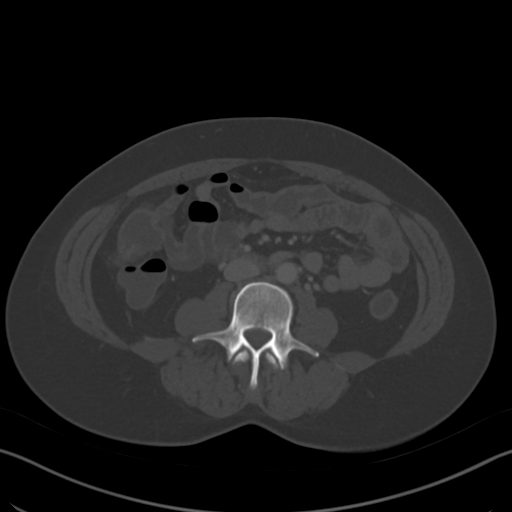
[im 60/93  soft-tissue]
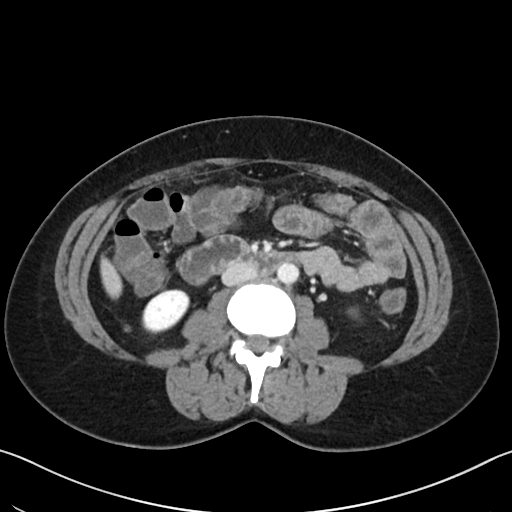
[im 70/93  soft-tissue]
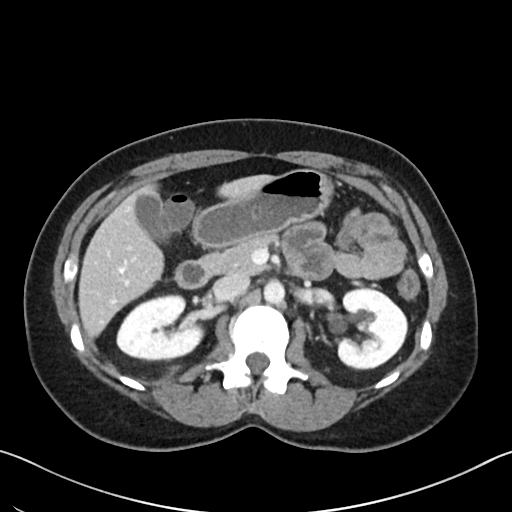
[im 74/93  soft-tissue]
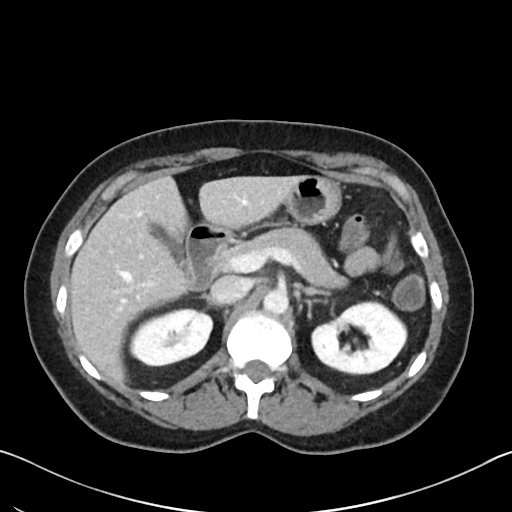
[im 79/93  soft-tissue]
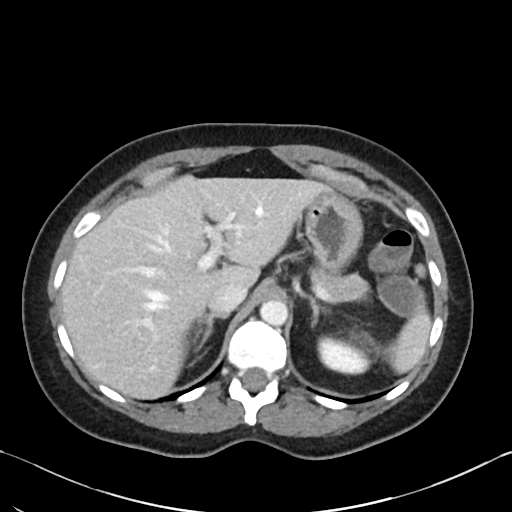
[im 88/93  soft-tissue]
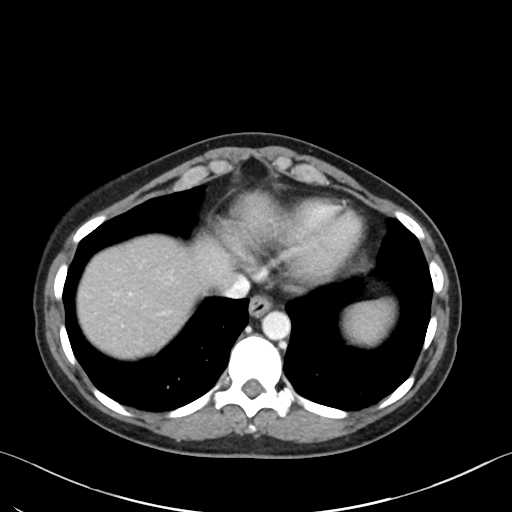

[Series 5: coronal · coronal · 0.86mm/px · 3 of 87 slices shown]
[im 29/87  soft-tissue]
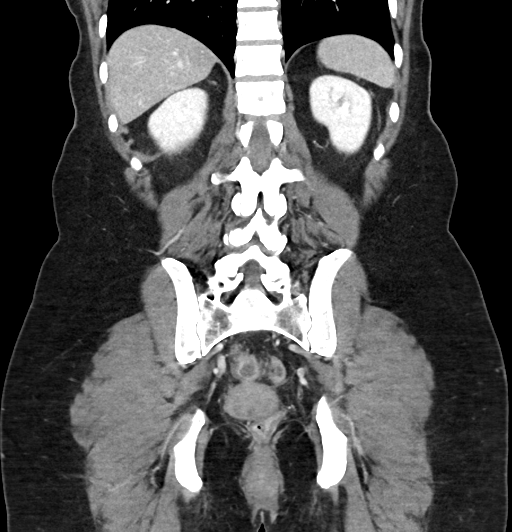
[im 39/87  soft-tissue]
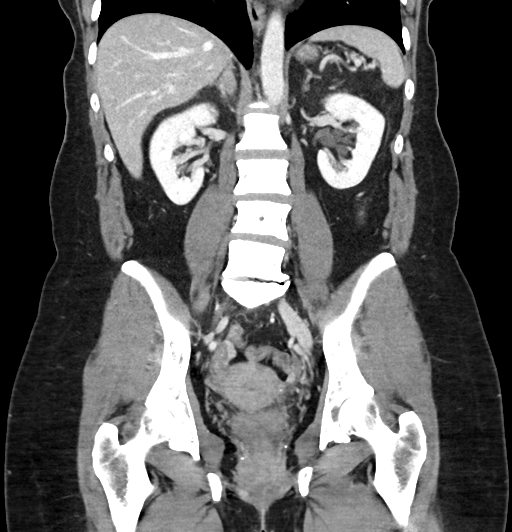
[im 48/87  soft-tissue]
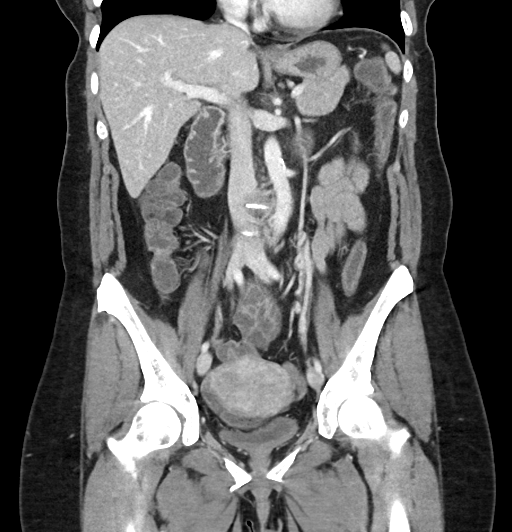

[17 of 46 positions shown; findings below may reference images not displayed]

FINDINGS: Lower chest: Lung bases are clear. No effusions. Heart is normal
size.

Hepatobiliary: No focal hepatic abnormality. Gallbladder
unremarkable.

Pancreas: No focal abnormality or ductal dilatation.

Spleen: No focal abnormality.  Normal size.

Adrenals/Urinary Tract: No adrenal abnormality. No focal renal
abnormality. No stones or hydronephrosis. Urinary bladder is
unremarkable.

Stomach/Bowel: Stomach, large and small bowel grossly unremarkable.

Vascular/Lymphatic: No evidence of aneurysm or adenopathy.

Reproductive: Uterus and adnexa unremarkable.  No mass.

Other: No free fluid or free air.

Musculoskeletal: No acute bony abnormality.
IMPRESSION: No acute findings.

## 2023-01-27 ENCOUNTER — Other Ambulatory Visit: Payer: Self-pay

## 2023-01-27 ENCOUNTER — Encounter (HOSPITAL_BASED_OUTPATIENT_CLINIC_OR_DEPARTMENT_OTHER): Payer: Self-pay

## 2023-01-27 ENCOUNTER — Emergency Department (HOSPITAL_BASED_OUTPATIENT_CLINIC_OR_DEPARTMENT_OTHER): Payer: PRIVATE HEALTH INSURANCE

## 2023-01-27 ENCOUNTER — Telehealth: Payer: Self-pay | Admitting: Family Medicine

## 2023-01-27 ENCOUNTER — Emergency Department (HOSPITAL_BASED_OUTPATIENT_CLINIC_OR_DEPARTMENT_OTHER)
Admission: EM | Admit: 2023-01-27 | Discharge: 2023-01-27 | Disposition: A | Discharge status: Home / Self Care | Payer: PRIVATE HEALTH INSURANCE | Attending: Emergency Medicine | Admitting: Emergency Medicine

## 2023-01-27 DIAGNOSIS — R0789 Other chest pain: Secondary | ICD-10-CM | POA: Insufficient documentation

## 2023-01-27 DIAGNOSIS — R0602 Shortness of breath: Secondary | ICD-10-CM | POA: Insufficient documentation

## 2023-01-27 DIAGNOSIS — Z91040 Latex allergy status: Secondary | ICD-10-CM | POA: Diagnosis not present

## 2023-01-27 LAB — CBC WITH DIFFERENTIAL/PLATELET
Abs Immature Granulocytes: 0.02 10*3/uL (ref 0.00–0.07)
Basophils Absolute: 0 10*3/uL (ref 0.0–0.1)
Basophils Relative: 0 %
Eosinophils Absolute: 0 10*3/uL (ref 0.0–0.5)
Eosinophils Relative: 1 %
HCT: 42.2 % (ref 36.0–46.0)
Hemoglobin: 14.2 g/dL (ref 12.0–15.0)
Immature Granulocytes: 0 %
Lymphocytes Relative: 29 %
Lymphs Abs: 2.5 10*3/uL (ref 0.7–4.0)
MCH: 33.1 pg (ref 26.0–34.0)
MCHC: 33.6 g/dL (ref 30.0–36.0)
MCV: 98.4 fL (ref 80.0–100.0)
Monocytes Absolute: 0.7 10*3/uL (ref 0.1–1.0)
Monocytes Relative: 9 %
Neutro Abs: 5.1 10*3/uL (ref 1.7–7.7)
Neutrophils Relative %: 61 %
Platelets: 325 10*3/uL (ref 150–400)
RBC: 4.29 MIL/uL (ref 3.87–5.11)
RDW: 12.4 % (ref 11.5–15.5)
WBC: 8.4 10*3/uL (ref 4.0–10.5)
nRBC: 0 % (ref 0.0–0.2)

## 2023-01-27 LAB — COMPREHENSIVE METABOLIC PANEL
ALT: 20 U/L (ref 0–44)
AST: 28 U/L (ref 15–41)
Albumin: 4.2 g/dL (ref 3.5–5.0)
Alkaline Phosphatase: 52 U/L (ref 38–126)
Anion gap: 13 (ref 5–15)
BUN: 12 mg/dL (ref 6–20)
CO2: 23 mmol/L (ref 22–32)
Calcium: 9.2 mg/dL (ref 8.9–10.3)
Chloride: 105 mmol/L (ref 98–111)
Creatinine, Ser: 0.67 mg/dL (ref 0.44–1.00)
GFR, Estimated: >60 mL/min (ref >60)
Glucose, Bld: 80 mg/dL (ref 70–99)
Potassium: 4.5 mmol/L (ref 3.5–5.1)
Sodium: 141 mmol/L (ref 135–145)
Total Bilirubin: 0.9 mg/dL (ref 0.3–1.2)
Total Protein: 7.8 g/dL (ref 6.5–8.1)

## 2023-01-27 LAB — TROPONIN I (HIGH SENSITIVITY): Troponin I (High Sensitivity): <2 ng/L (ref <18)

## 2023-01-27 NOTE — ED Triage Notes (Addendum)
This morning felt short of breath, states hands were tingling, chest tightness. Sent down by [facility] PCP.  NAD noted however feels short of breath and "out of it". SpO2 100% after ambulation to room

## 2023-01-27 NOTE — Discharge Instructions (Signed)
Your laboratory results were within normal limits today.  We discussed appropriate follow-up with your primary care physician.  If you experience any worsening symptoms please return to the emergency department.

## 2023-01-27 NOTE — Telephone Encounter (Signed)
FYI: This call has been transferred to Access Nurse. Once the result note has been entered staff can address the message at that time.  Patient called in with the following symptoms:  Red Word: SOB, Disorientation, Wheezing   Please advise at Mobile [phone] (mobile)  Message is routed to Provider Pool and [facility] Triage    **Pt called and advised what symptoms she was having. Attempted warm transfer over to triage and was on hold 6 mins with no pickup. After the first 3 mins, reached out to [facility] for high priority triage. Went back to pt to advise transfer to [employer]. Pt stated that they were in the building and were coming up to our office. Kim advised that she go ahead down to the ED to be evaluated. Advised pt and husband of this when they arrived. Pt agreed to go to ED.**

## 2023-01-27 NOTE — ED Provider Notes (Signed)
[facility] Provider Note   CSN: [id number] Arrival date & time: 01/27/23  0932     History  Chief Complaint  Patient presents with   Shortness of Breath    [name] [name] is a 52 y.o. female.  52 y.o female with no PMH presents to the ED with a chief complaint of shortness of breath x today. Patient reports she was getting ready for work this morning, reports she began to feel short of breath, chest tightness, her hands were tingling, her lips were blue.  Collateral information obtained from husband at the bedside, who reports she felt like she was struggling to catch her breath.  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  Patient tried to call [facility] PCP but they sent her to the ED for further evaluation.  Her husband also reports she had a hard time speaking at the time, felt like she could not get her words out.  Patient feels that her symptoms have now resolved, however feels like she is just tired at this time.Her oxygen saturation on arrival was 100% with no signs of respiratory distress.   The history is provided by the patient.  Shortness of Breath Associated symptoms: chest pain   Associated symptoms: no abdominal pain, no fever and no vomiting        Home Medications Prior to Admission medications   Medication Sig Start Date End Date Taking? Authorizing Provider  albuterol (VENTOLIN HFA) 108 (90 Base) MCG/ACT inhaler INHALE 1-2 PUFF(S) EVERY 4-6 HOURS AS NEEDED 02/18/22   Zola Button, Grayling Congress, DO  beclomethasone (QVAR REDIHALER) 40 MCG/ACT inhaler Inhale 2 puffs into the lungs 2 (two) times daily. 01/22/22   Donato Schultz, DO  lansoprazole (PREVACID) 30 MG capsule Take 1 capsule (30 mg total) by mouth daily at 12 noon. 06/04/22   Napoleon Form, MD  linaclotide (LINZESS) 72 MCG capsule Take 1  capsule (72 mcg total) by mouth daily before breakfast. 07/15/22   Nandigam, Eleonore Chiquito, MD  ondansetron (ZOFRAN) 4 MG tablet TAKE 1 TABLET (4 MG TOTAL) BY MOUTH DAILY AT 2 PM. AS NEEDED 03/12/22   Napoleon Form, MD  valACYclovir (VALTREX) 1000 MG tablet TAKE 1 TABLET TWICE A DAY FOR 3 DAYS WITH SYMPTOM ONSET 05/19/22   Jerene Bears, MD      Allergies    Sulfonamide derivatives, Aspirin, Doxycycline, and Latex    Review of Systems   Review of Systems  Constitutional:  Negative for chills and fever.  Respiratory:  Positive for shortness of breath.   Cardiovascular:  Positive for chest pain.  Gastrointestinal:  Negative for abdominal pain, nausea and vomiting.  Genitourinary:  Negative for flank pain.  Musculoskeletal:  Negative for back pain.  All other systems reviewed and are negative.   Physical Exam Updated Vital Signs BP 123/81 (BP Location: Right Arm)   Pulse 91   Temp 97.9 F (36.6 C) (Oral)   Resp 20   Ht 5\' 7"  (1.702 m)   Wt 67.1 kg   SpO2 100%   BMI 23.18 kg/m  Physical Exam Vitals and nursing note reviewed.  Constitutional:      General: She is not in acute distress.    Appearance: She is well-developed.  HENT:     Head: Normocephalic and atraumatic.  Mouth/Throat:     Pharynx: No oropharyngeal exudate.  Eyes:     Pupils: Pupils are equal, round, and reactive to light.  Cardiovascular:     Rate and Rhythm: Regular rhythm.     Heart sounds: Normal heart sounds.  Pulmonary:     Effort: Pulmonary effort is normal. No respiratory distress.     Breath sounds: Normal breath sounds.  Abdominal:     General: Bowel sounds are normal. There is no distension.     Palpations: Abdomen is soft.     Tenderness: There is no abdominal tenderness.  Musculoskeletal:        General: No tenderness or deformity.     Cervical back: Normal range of motion.     Right lower leg: No edema.     Left lower leg: No edema.  Skin:    General: Skin is warm and dry.   Neurological:     Mental Status: She is alert and oriented to person, place, and time.     Comments: Alert, oriented, thought content appropriate. Speech fluent without evidence of aphasia. Able to follow 2 step commands without difficulty.  Cranial Nerves:  II:  Peripheral visual fields grossly normal, pupils, round, reactive to light III,IV, VI: ptosis not present, extra-ocular motions intact bilaterally  V,VII: smile symmetric, facial light touch sensation equal VIII: hearing grossly normal bilaterally  IX,X: midline uvula rise  XI: bilateral shoulder shrug equal and strong XII: midline tongue extension  Motor:  5/5 in upper and lower extremities bilaterally including strong and equal grip strength and dorsiflexion/plantar flexion Sensory: light touch normal in all extremities.  Cerebellar: normal finger-to-nose with bilateral upper extremities, pronator drift negative Gait: normal gait and balance       ED Results / Procedures / Treatments   Labs (all labs ordered are listed, but only abnormal results are displayed) Labs Reviewed  CBC WITH DIFFERENTIAL/PLATELET  COMPREHENSIVE METABOLIC PANEL  TROPONIN I (HIGH SENSITIVITY)    EKG None  Radiology DG Chest 2 View  Result Date: 01/27/2023 CLINICAL DATA:  Shortness of breath, chest tightness. EXAM: CHEST - 2 VIEW COMPARISON:  Chest radiograph 04/19/2019 FINDINGS: The cardiomediastinal silhouette is normal There is no focal consolidation or pulmonary edema. There is no pleural effusion or pneumothorax There is no acute osseous abnormality. IMPRESSION: No radiographic evidence of acute cardiopulmonary process. Electronically Signed   By: Lesia Hausen M.D.   On: 01/27/2023 10:31    Procedures Procedures    Medications Ordered in ED Medications - No data to display  ED Course/ Medical Decision Making/ A&P                             Medical Decision Making Amount and/or Complexity of Data Reviewed Labs:  ordered. Radiology: ordered.   This patient presents to the ED for concern of sob, this involves a number of treatment options, and is a complaint that carries with it a high risk of complications and morbidity.  The differential diagnosis includes ACS, Pneumonia versus heat induce asthma.    Co morbidities: Discussed in HPI   Brief History:  See HPI.   EMR reviewed including pt PMHx, past surgical history and past visits to ER.   See HPI for more details   Lab Tests:  I ordered and independently interpreted labs.  The pertinent results include:    I personally reviewed all laboratory work and imaging. Metabolic panel without any acute abnormality specifically  kidney function within normal limits and no significant electrolyte abnormalities. CBC without leukocytosis or significant anemia.   Imaging Studies:  NAD. I personally reviewed all imaging studies and no acute abnormality found. I agree with radiology interpretation.  Cardiac Monitoring:  The patient was maintained on a cardiac monitor.  I personally viewed and interpreted the cardiac monitored which showed an underlying rhythm of: NSR 84 EKG non-ischemic   Medicines ordered:  N/A  Reevaluation:  After the interventions noted above I re-evaluated patient and found that they have :resolved  Social Determinants of Health:  The patient's social determinants of health were a factor in the care of this patient    Problem List / ED Course:  Patient here with sudden onset of shortness of breath, hands tingling, blue lips that she left her house this morning.  Reports episode lasted for approximately 1 hour.  No prior history of anxiety, no prior history of panic attacks.  Had a similar episode in the past after being at the beach in [place].  Her husband at the bedside.  Reports that she had heat induced asthma.  However, my exam there is no wheezing noted, she is not hypoxic oxygen is at 100% on room air,  the rest of her vitals are within normal limits.  She does report having a hard time getting her words out, however feels like this was occurring because she could not take a deep breath.  Neurological exam is intact, no headache, no vision changes, no other acute findings to suggest CVA.  Lab work was obtained which was unremarkable.  CMP with no electrolyte derangement to account for symptoms.  CBC is normal.  Troponin was obtained due to shortness of breath, she does not have any chest pain on today's visit this is also negative.  Chest x-ray obtained without signs of pneumonia or signs of asthma exacerbation.  She is not wheezing on my exam, I do not feel that she needs a breathing treatment at this time or an inhaler.  I discussed these results with patient.  She did have a similar episode as mentioned on the beach, we discussed that some arrhythmia concern, will provide her with a cardiology referral for further evaluation.  Her EKG here was normal sinus rhythm, with a negative troponin I do not feel that this is likely ACS.  Patient does report that her symptoms have now resolved after she arrived in the emergency department.  We discussed outpatient follow-up with her primary care physician versus cardiology.  Patient is hemodynamically stable for discharge.  Dispostion:  After consideration of the diagnostic results and the patients response to treatment, I feel that the patent would benefit from follow up with primary care.    Portions of this note were generated with Scientist, clinical (histocompatibility and immunogenetics). Dictation errors may occur despite best attempts at proofreading.   Final Clinical Impression(s) / ED Diagnoses Final diagnoses:  SOB (shortness of breath)    Rx / DC Orders ED Discharge Orders          Ordered    Ambulatory referral to Cardiology        01/27/23 1108              Claude Manges, PA-C 01/27/23 1108    Tanda Rockers A, DO 01/29/23 [phone]

## 2023-01-27 NOTE — Telephone Encounter (Signed)
Noted. Pt going to the ED. °

## 2023-03-01 NOTE — Progress Notes (Signed)
[name] [name]    [id number]    12/06/1970  Primary Care Physician:Lowne Chase, Grayling Congress, DO  Referring Physician: Zola Button, Grayling Congress, DO [address] [place],  [place] [address]   Chief complaint:  IBS constipation and diarrhea, LLQ abd pain, nausea Chief Complaint  Patient presents with   barrett's esophagus    Having heartburn and nausea with certain things eaten   Nausea    HPI:52 year old very pleasant female here with complaints of worsening GERD symptoms and follow-up on Barrett's esophagus.   Last seen on 09-02-21. She was seen in the ED on 01-27-23 for SOB.  Today, she complains of heartburn that tends to occur almost daily. She also complains of accompanying nausea and states her symptoms are worsened when she hasn't had anything to eat in a while. She denies any accompanying dysphagia. Her symptoms are worst when having a late dinner and while laying down, nonetheless she can experience them throughout the day. She also complains of accompanying minimal pressure in her chest and upper abdomen  She reports that she takes Zofran and lansoprazole to relief her symptoms.   Patient denies diarrhea, constipation, blood in stool, black stool, vomiting, abdominal pain, bloating, unintentional weight loss, reflux, dysphagia.  GI Hx:  Colonoscopy 10-01-21  - One 1 mm polyp in the ascending colon, removed with a cold biopsy forceps. Resected and retrieved.  - One 5 mm polyp in the transverse colon, removed with a cold snare. Resected and retrieved.  - Diverticulosis in the sigmoid colon, in the descending colon and in the transverse colon.  - External and internal hemorrhoids. Surgical [P], colon, transverse and ascending, polyp (2) SESSILE SERRATED POLYP WITHOUT CYTOLOGIC DYSPLASIA LYMPHOID AGGREGATE  CT Abdomen Pelvis w contrast 07-19-21 No acute findings.   EGD July 27, 2011 by Dr. Juanda Chance: Mild gastritis otherwise normal exam    Colonoscopy May 30, 2009 by Dr. Juanda Chance for chronic abdominal pain: Normal  Colonoscopy 06/28/20  - Two 5 to 8 mm polyps in the transverse colon and in the cecum, removed with a cold snare. Resected and retrieved.  - One 15 mm polyp in the cecum, removed piecemeal using a cold snare. Resected and retrieved. - Diverticulosis in the sigmoid colon and in the descending colon. - Non-bleeding internal hemorrhoids.  1. Surgical [P], esophagus, GE junction - GASTROESOPHAGEAL MUCOSA WITH INTESTINAL METAPLASIA CONSISTENT WITH BARRETT'S ESOPHAGUS. - NO DYSPLASIA OR CARCINOMA. 2. Surgical [P], colon, cecum, transverse, polyp (3) - SESSILE SERRATED POLYP(S) WITHOUT CYTOLOGIC DYSPLASIA.   Current Outpatient Medications:    albuterol (VENTOLIN HFA) 108 (90 Base) MCG/ACT inhaler, INHALE 1-2 PUFF(S) EVERY 4-6 HOURS AS NEEDED, Disp: 8.5 each, Rfl: 1   beclomethasone (QVAR REDIHALER) 40 MCG/ACT inhaler, Inhale 2 puffs into the lungs 2 (two) times daily., Disp: 10.6 g, Rfl: 5   lansoprazole (PREVACID) 30 MG capsule, Take 1 capsule (30 mg total) by mouth daily at 12 noon., Disp: 90 capsule, Rfl: 3   linaclotide (LINZESS) 72 MCG capsule, Take 1 capsule (72 mcg total) by mouth daily before breakfast., Disp: 30 capsule, Rfl: 5   ondansetron (ZOFRAN) 4 MG tablet, TAKE 1 TABLET (4 MG TOTAL) BY MOUTH DAILY AT 2 PM. AS NEEDED, Disp: 24 tablet, Rfl: 2   valACYclovir (VALTREX) 1000 MG tablet, TAKE 1 TABLET TWICE A DAY FOR 3 DAYS WITH SYMPTOM ONSET, Disp: 180 tablet, Rfl: 1    Allergies as of 03/10/2023 - Review  Complete 03/10/2023  Allergen Reaction Noted   Sulfonamide derivatives Anaphylaxis 01/27/2023   Aspirin  05/14/2009   Doxycycline Nausea And Vomiting 11/15/2012   Latex  07/08/2011    Past Medical History:  Diagnosis Date   Abnormal Pap smear, atypical squamous cells of undetermined sign (ASC-US) 10/98   CIN 1   Allergy    Asthma    Atypical nevus 07/29/2010   Right Medial Buttock-Moderate    Atypical nevus x 2 11/15/2006   Upper Right Back-Moderate and Lower Mid Back-Moderate to Marked (w/s)   Atypical nevus x 3 11/04/2007   Right Side-Slight to Moderate, Right Upper Side-Moderate, and Left Upper Breast-Moderate   Atypical nevus x 7 10/15/2006   Right Medial Hip-Moderate, Right Middle Hip-Slight, Right Lateral Hip-Mild, Right Axillary Vault-Moderate(w/s), Right Post Calf-Moderate(w/s),Right Inner Buttock-Slight, and Left Mid Back-Moderate(w/s)   Condyloma acuminata 2001   Endometriosis 8/95   DepoLupron 4/96-10/96   Hiatal hernia    History of campylobacteriosis    HSV-2 infection    Hyperlipidemia    IBS (irritable bowel syndrome)    Internal thrombosed hemorrhoids    Migraine    without aura   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   Right Upper Arm (tx p bx)    Past Surgical History:  Procedure Laterality Date   AUGMENTATION MAMMAPLASTY Bilateral    BREAST BIOPSY Left    benign   BREAST ENHANCEMENT SURGERY  02/2000   BREAST MASS EXCISION Right 12/01   fibroadenoma   EXPLORATORY LAPAROTOMY  8/95   endometriosis    Family History  Problem Relation Age of Onset   Irritable bowel syndrome Mother    Hernia Mother    Ulcers Maternal Grandfather        gastrectomy   Leukemia Maternal Grandfather    Diabetes Maternal Grandfather    Prostate cancer Maternal Grandfather    Diabetes Maternal Grandmother    Coronary artery disease Maternal Grandmother    Stroke Maternal Grandmother    Heart failure Maternal Grandmother    Colon cancer Neg Hx    Esophageal cancer Neg Hx    Liver disease Neg Hx    Pancreatic cancer Neg Hx    Stomach cancer Neg Hx     Social History   Socioeconomic History   Marital status: Married    Spouse name: Not on file   Number of children: 0   Years of education: Not on file   Highest education level: Not on file  Occupational History    Employer: [employer]  Tobacco Use   Smoking status: Never    Smokeless tobacco: Never  Vaping Use   Vaping status: Never Used  Substance and Sexual Activity   Alcohol use: Yes    Comment: occasionally   Drug use: No   Sexual activity: Yes    Birth control/protection: None  Other Topics Concern   Not on file  Social History Narrative   Currently unemployed --- looking for a job    Social Determinants of Corporate investment banker Strain: Not on file  Food Insecurity: Not on file  Transportation Needs: Not on file  Physical Activity: Not on file  Stress: Not on file  Social Connections: Not on file  Intimate Partner Violence: Not on file      Review of systems: Review of Systems  Constitutional:  Negative for unexpected weight change.  HENT:  Negative for trouble swallowing.   Respiratory:  Positive for chest tightness.   Gastrointestinal:  Positive for nausea. Negative for abdominal distention, abdominal pain, anal bleeding, blood in stool, constipation, diarrhea, rectal pain and vomiting.       +reflux/heartburn    Physical Exam: Vitals:   03/10/23 1515  BP: 100/70  Pulse: (!) 104    Body mass index is 23.38 kg/m. General: well-appearing   Eyes: sclera anicteric, no redness ENT: oral mucosa moist without lesions, no cervical or supraclavicular lymphadenopathy CV: RRR, no JVD, no peripheral edema Resp: clear to auscultation bilaterally, normal RR and effort noted GI: soft, RUQ tenderness, with active bowel sounds. No guarding or palpable organomegaly noted. Skin; warm and dry, no rash or jaundice noted Neuro: awake, alert and oriented x 3. Normal gross motor function and fluent speech   Data Reviewed:  Reviewed labs, radiology imaging, old records and pertinent past GI work up   Assessment and Plan/Recommendations: 52 year old very pleasant female with history of chronic irritable bowel syndrome with alternating constipation and diarrhea Use Linzess 145 mcg daily Increase dietary fiber and water  intake  Worsening GERD symptoms with epigastric and retrosternal discomfort Due for surveillance EGD for Barrett's esophagus Antireflux measures Use lansoprazole 30 mg, 30 minutes before breakfast  Schedule EGD for further evaluation The risks and benefits as well as alternatives of endoscopic procedure(s) have been discussed and reviewed. All questions answered. The patient agrees to proceed.  Obtain right upper quadrant ultrasound to exclude gallbladder disease or cholecystitis    I,Safa M Kadhim,acting as a scribe for Marsa Aris, [name] [name].,have documented all relevant documentation on the behalf of Marsa Aris, [name] [name],as directed by  Marsa Aris, [name] [name] while in the presence of Marsa Aris, [name] [name].   I, Marsa Aris, [name] [name], have reviewed all documentation for this visit. The documentation on 03/10/23 for the exam, diagnosis, procedures, and orders are all accurate and complete.     The patient was provided an opportunity to ask questions and all were answered. The patient agreed with the plan and demonstrated an understanding of the instructions.  [name] [name] , [name] [name]    CC: Zola Button, Grayling Congress, *

## 2023-03-10 ENCOUNTER — Encounter: Payer: Self-pay | Admitting: Gastroenterology

## 2023-03-10 ENCOUNTER — Ambulatory Visit: Payer: PRIVATE HEALTH INSURANCE | Admitting: Gastroenterology

## 2023-03-10 VITALS — BP 100/70 | HR 104 | Ht 67.0 in | Wt 149.2 lb

## 2023-03-10 DIAGNOSIS — R110 Nausea: Secondary | ICD-10-CM

## 2023-03-10 DIAGNOSIS — Z8719 Personal history of other diseases of the digestive system: Secondary | ICD-10-CM

## 2023-03-10 DIAGNOSIS — R1011 Right upper quadrant pain: Secondary | ICD-10-CM | POA: Diagnosis not present

## 2023-03-10 DIAGNOSIS — K219 Gastro-esophageal reflux disease without esophagitis: Secondary | ICD-10-CM | POA: Diagnosis not present

## 2023-03-10 MED ORDER — LANSOPRAZOLE 30 MG PO CPDR: DELAYED_RELEASE_CAPSULE | ORAL | 3 refills | Status: DC

## 2023-03-10 MED ORDER — FAMOTIDINE 20 MG PO TABS: 20.0000 mg | ORAL_TABLET | Freq: Every day | ORAL | 6 refills | Status: DC

## 2023-03-10 MED ORDER — LINACLOTIDE 145 MCG PO CAPS: 145.0000 ug | ORAL_CAPSULE | Freq: Every day | ORAL | 3 refills | Status: DC

## 2023-03-10 NOTE — Patient Instructions (Addendum)
You will be contacted by [facility] Scheduling in the next 2 days to arrange a Abdominal Ultrasound.  The number on your caller ID will be [phone], please answer when they call.  If you have not heard from them in 2 days please call [phone] to schedule.     You have been scheduled for an endoscopy. Please follow written instructions given to you at your visit today.  If you use inhalers (even only as needed), please bring them with you on the day of your procedure.  If you take any of the following medications, they will need to be adjusted prior to your procedure:   DO NOT TAKE 7 DAYS PRIOR TO TEST- Trulicity (dulaglutide) Ozempic, Wegovy (semaglutide) Mounjaro (tirzepatide) Bydureon Bcise (exanatide extended release)  DO NOT TAKE 1 DAY PRIOR TO YOUR TEST Rybelsus (semaglutide) Adlyxin (lixisenatide) Victoza (liraglutide) Byetta (exanatide) ___________________________________________________________________________     We have sent the following medications to your pharmacy for you to pick up at your convenience: Lansoprazole  Pepcid  Linzess 145 mcg  Follow up in 6 months, You will need to call back for that appointment  Due to recent changes in healthcare laws, you may see the results of your imaging and laboratory studies on MyChart before your provider has had a chance to review them.  We understand that in some cases there may be results that are confusing or concerning to you. Not all laboratory results come back in the same time frame and the provider may be waiting for multiple results in order to interpret others.  Please give [name] 48 hours in order for your provider to thoroughly review all the results before contacting the office for clarification of your results.    _______________________________________________________  If your blood pressure at your visit was 140/90 or greater, please contact your primary care physician to follow up on  this.  _______________________________________________________  If you are age 19 or older, your body mass index should be between 23-30. Your Body mass index is 23.38 kg/m. If this is out of the aforementioned range listed, please consider follow up with your Primary Care Provider.  If you are age 39 or younger, your body mass index should be between 19-25. Your Body mass index is 23.38 kg/m. If this is out of the aformentioned range listed, please consider follow up with your Primary Care Provider.   ________________________________________________________  The [facility] providers would like to encourage you to use [facility] to communicate with providers for non-urgent requests or questions.  Due to long hold times on the telephone, sending your provider a message by [facility] may be a faster and more efficient way to get a response.  Please allow 48 business hours for a response.  Please remember that this is for non-urgent requests.  _______________________________________________________   I appreciate the  opportunity to care for you  Thank You   Marsa Aris , MD

## 2023-03-11 ENCOUNTER — Other Ambulatory Visit (HOSPITAL_COMMUNITY): Payer: Self-pay

## 2023-03-11 ENCOUNTER — Telehealth: Payer: Self-pay | Admitting: Pharmacy Technician

## 2023-03-11 NOTE — Telephone Encounter (Signed)
Pharmacy Patient Advocate Encounter  Received notification from [facility] that Prior Authorization for [facility] has been APPROVED from 8.1.24 to 8.1.25. Ran test claim, Copay is $35  PA #/Case ID/Reference #: [id number]

## 2023-03-11 NOTE — Telephone Encounter (Signed)
Pharmacy Patient Advocate Encounter   Received notification from CoverMyMeds that prior authorization for [facility] is required/requested.   Insurance verification completed.   The patient is insured through  [facility]  .   Per test claim: PA required; PA submitted to [facility] via CoverMyMeds Key/confirmation #/EOC [id number] Status is pending

## 2023-04-02 ENCOUNTER — Ambulatory Visit: Payer: PRIVATE HEALTH INSURANCE | Attending: Interventional Cardiology | Admitting: Interventional Cardiology

## 2023-04-02 ENCOUNTER — Encounter: Payer: Self-pay | Admitting: Interventional Cardiology

## 2023-04-02 VITALS — BP 106/74 | HR 91 | Ht 67.0 in | Wt 146.6 lb

## 2023-04-02 DIAGNOSIS — E782 Mixed hyperlipidemia: Secondary | ICD-10-CM | POA: Diagnosis not present

## 2023-04-02 DIAGNOSIS — R0602 Shortness of breath: Secondary | ICD-10-CM | POA: Diagnosis not present

## 2023-04-02 NOTE — Progress Notes (Signed)
Cardiology Office Note   Date:  04/02/2023   ID:  [name] [name], DOB 11-27-1970, MRN [id number]  PCP:  Donato Schultz, DO    No chief complaint on file.  DOE  [employer] Readings from Last 3 Encounters:  04/02/23 146 lb 9.6 oz (66.5 kg)  03/10/23 149 lb 4 oz (67.7 kg)  01/27/23 148 lb (67.1 kg)       History of Present Illness: [name] [name] is a 52 y.o. female who is being seen today for the evaluation of shortness of breath at the request of Donato Schultz, *.   She has episodes of SHOB, finger and lip tingling.  Sometimes associated with workouts. Events are mostly random.  She can workout sometimes without difficulty.    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  June 2024, went to ER: "getting ready for work this morning, reports she began to feel short of breath, chest tightness, her hands were tingling, her lips were blue. Collateral information obtained from husband at the bedside, who reports she felt like she was struggling to catch her breath. 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 "  Was better by the time she went to ER.  Negative cardiac w/u.  Denies : Chest pain. Leg edema. Nitroglycerin use. Orthopnea. Palpitations. Paroxysmal nocturnal dyspnea. Syncope.    Family h/o heart problems including Grandmothers. Mother with HBP.   Had an episode of dizziness and was "disoriented" at work.   Walks a lot at work.  She does lift weights and runs a few times a week.    Past Medical History:  Diagnosis Date   Abnormal Pap smear, atypical squamous cells of undetermined sign (ASC-US) 10/98   CIN 1   Allergy    Asthma    Atypical nevus 07/29/2010   Right Medial Buttock-Moderate   Atypical nevus x 2 11/15/2006   Upper Right Back-Moderate and Lower Mid Back-Moderate to Marked (w/s)   Atypical nevus x 3 11/04/2007   Right Side-Slight to  Moderate, Right Upper Side-Moderate, and Left Upper Breast-Moderate   Atypical nevus x 7 10/15/2006   Right Medial Hip-Moderate, Right Middle Hip-Slight, Right Lateral Hip-Mild, Right Axillary Vault-Moderate(w/s), Right Post Calf-Moderate(w/s),Right Inner Buttock-Slight, and Left Mid Back-Moderate(w/s)   Condyloma acuminata 2001   Endometriosis 8/95   DepoLupron 4/96-10/96   Hiatal hernia    History of campylobacteriosis    HSV-2 infection    Hyperlipidemia    IBS (irritable bowel syndrome)    Internal thrombosed hemorrhoids    Migraine    without aura   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 10.6 g 5   famotidine (PEPCID) 20 MG tablet Take 1 tablet (20 mg total)  by mouth at bedtime. 30 tablet 6   lansoprazole (PREVACID) 30 MG capsule 1 capsule by mouth daily every AM 90 capsule 3   linaclotide (LINZESS) 145 MCG CAPS capsule Take 1 capsule (145 mcg total) by mouth daily before breakfast. 30 capsule 3   ondansetron (ZOFRAN) 4 MG tablet TAKE 1 TABLET (4 MG TOTAL) BY MOUTH DAILY AT 2 PM. AS NEEDED 24 tablet 2   valACYclovir (VALTREX) 1000 MG tablet TAKE 1 TABLET TWICE A DAY FOR 3 DAYS WITH SYMPTOM ONSET 180 tablet 1   No current facility-administered medications for this visit.    Allergies:   Sulfonamide derivatives, Aspirin, Doxycycline, and Latex    Social History:  The patient  reports that she  has never smoked. She has never used smokeless tobacco. She reports current alcohol use. She reports that she does not use drugs.   Family History:  The patient's family history includes Coronary artery disease in her maternal grandmother; Diabetes in her maternal grandfather and maternal grandmother; Heart failure in her maternal grandmother; Hernia in her mother; Irritable bowel syndrome in her mother; Leukemia in her maternal grandfather; Prostate cancer in her maternal grandfather; Stroke in her maternal grandmother; Ulcers in her maternal grandfather.    ROS:  Please see the history of present illness.   Otherwise, review of systems are positive for episodic DOE.   All other systems are reviewed and negative.    PHYSICAL EXAM: VS:  BP 106/74   Pulse 91   Ht 5\' 7"  (1.702 m)   Wt 146 lb 9.6 oz (66.5 kg)   SpO2 98%   BMI 22.96 kg/m  , BMI Body mass index is 22.96 kg/m. GEN: Well nourished, well developed, in no acute distress HEENT: normal Neck: no JVD, carotid bruits, or masses Cardiac: RRR; no murmurs, rubs, or gallops,no edema  Respiratory:  clear to auscultation bilaterally, normal work of breathing GI: soft, nontender, nondistended, + BS MS: no deformity or atrophy Skin: warm and dry, no rash Neuro:  Strength and sensation are intact Psych: euthymic mood, full affect   EKG:   The ekg ordered today demonstrates   June 2024: NSR, no ST changes   Recent Labs: 01/27/2023: ALT 20; BUN 12; Creatinine, Ser 0.67; Hemoglobin 14.2; Platelets 325; Potassium 4.5; Sodium 141   Lipid Panel    Component Value Date/Time   CHOL 263 (H) 01/08/2022 1515   CHOL 323 (H) 06/19/2021 0106   TRIG 55.0 01/08/2022 1515   HDL 81.40 01/08/2022 1515   HDL 93 06/19/2021 0106   CHOLHDL 3 01/08/2022 1515   VLDL 11.0 01/08/2022 1515   LDLCALC 171 (H) 01/08/2022 1515   LDLCALC 221 (H) 06/19/2021 0106     Other studies Reviewed: Additional studies/ records that were reviewed today with results  demonstrating: Troponin negative.   ASSESSMENT AND PLAN:  DOE: Plan for echocardiogram to evaluate for structural heart disease.  No signs of volume overload or congestive heart failure on exam. Hyperlipidemia: heart healthy diet encouraged. Whole food, plant-based diet.  High-fiber diet.  Last LDL was 171.  If her calcium score is elevated, would consider initiating statin therapy. Calcium scoring CT for screening would be reasonable given some family members with heart disease and high cholesterol in the patient.  She is agreeable.   Current medicines are reviewed at length with the patient today.  The patient concerns regarding her medicines were addressed.  The following changes have been made:  No change  Labs/ tests ordered today include:  Orders Placed This Encounter  Procedures   EKG 12-Lead    Recommend 150 minutes/week of aerobic exercise Low fat, low carb, high fiber diet recommended  Disposition:   FU as needed   Signed, Lance Muss, MD  04/02/2023 1:18 PM    [facility] [address] [place], [place], [place]  [address] Phone: [phone]; Fax: [phone]

## 2023-04-02 NOTE — Patient Instructions (Signed)
Medication Instructions:  Your physician recommends that you continue on your current medications as directed. Please refer to the Current Medication list given to you today.  *If you need a refill on your cardiac medications before your next appointment, please call your pharmacy*   Lab Work: none If you have labs (blood work) drawn today and your tests are completely normal, you will receive your results only by: [place] (if you have MyChart) OR A paper copy in the mail If you have any lab test that is abnormal or we need to change your treatment, we will call you to review the results.   Testing/Procedures: Your physician has requested that you have an echocardiogram. Echocardiography is a painless test that uses sound waves to create images of your heart. It provides your doctor with information about the size and shape of your heart and how well your heart's chambers and valves are working. This procedure takes approximately one hour. There are no restrictions for this procedure. Please do NOT wear cologne, perfume, aftershave, or lotions (deodorant is allowed). Please arrive 15 minutes prior to your appointment time.  Dr Irish Lack recommends you have a Calcium Score CT Scan   Follow-Up: At [facility], you and your health needs are our priority.  As part of our continuing mission to provide you with exceptional heart care, we have created designated Provider Care Teams.  These Care Teams include your primary Cardiologist (physician) and Advanced Practice Providers (APPs -  Physician Assistants and Nurse Practitioners) who all work together to provide you with the care you need, when you need it.  We recommend signing up for the patient portal called "MyChart".  Sign up information is provided on this After Visit Summary.  MyChart is used to connect with patients for Virtual Visits (Telemedicine).  Patients are able to view lab/test results, encounter notes, upcoming  appointments, etc.  Non-urgent messages can be sent to your provider as well.   To learn more about what you can do with MyChart, go to [email or web address].    Your next appointment:   As needed  Provider:   Larae Grooms, MD     Other Instructions

## 2023-04-23 ENCOUNTER — Ambulatory Visit (HOSPITAL_COMMUNITY): Payer: PRIVATE HEALTH INSURANCE | Attending: Interventional Cardiology

## 2023-04-23 DIAGNOSIS — R0602 Shortness of breath: Secondary | ICD-10-CM | POA: Diagnosis present

## 2023-04-23 LAB — ECHOCARDIOGRAM COMPLETE
Area-P 1/2: 3.54 cm2
S' Lateral: 3.1 cm

## 2023-05-04 ENCOUNTER — Ambulatory Visit: Payer: PRIVATE HEALTH INSURANCE | Admitting: Nurse Practitioner

## 2023-05-04 ENCOUNTER — Encounter: Payer: Self-pay | Admitting: Nurse Practitioner

## 2023-05-04 VITALS — BP 118/80 | HR 89 | Temp 97.5°F

## 2023-05-04 DIAGNOSIS — J0140 Acute pansinusitis, unspecified: Secondary | ICD-10-CM

## 2023-05-04 DIAGNOSIS — Z789 Other specified health status: Secondary | ICD-10-CM

## 2023-05-04 NOTE — Progress Notes (Signed)
[facility]- [facility]  Subjective:  Patient ID: [name] [name], female    DOB: [birth year]  Age: 52 y.o. MRN: [id number]  CC: Wellness Exam   HPI MASAE COMI presents for wellness exam visit for insurance benefit.  Patient has a WUJ:WJXBJY Zola Button  PMH significant for: hx of IBS, asthma Last labs per PCP were completed: June 2024  Health Maintenance:  Colonoscopy: Feb 2023, repeat in 5 years Mammogram: April 2024 Pap: due for this.     Smoker:Never  Immunizations:  Shingrix-  declined  Flu- yearly COVID-  x4 Tdap- due 2028  Lifestyle: Diet- avoids sugars.  Exercise- lift weights, elliptical machine, walks, and running 3-4 x a week.    Currently c/o Sinusitis- flonase, sudafed, xyzal. Symptoms started almost  Wednesday.   Past Medical History:  Diagnosis Date   Abnormal Pap smear, atypical squamous cells of undetermined sign (ASC-US) 10/98   CIN 1   Allergy    Asthma    Atypical nevus 07/29/2010   Right Medial Buttock-Moderate   Atypical nevus x 2 11/15/2006   Upper Right Back-Moderate and Lower Mid Back-Moderate to Marked (w/s)   Atypical nevus x 3 11/04/2007   Right Side-Slight to Moderate, Right Upper Side-Moderate, and Left Upper Breast-Moderate   Atypical nevus x 7 10/15/2006   Right Medial Hip-Moderate, Right Middle Hip-Slight, Right Lateral Hip-Mild, Right Axillary Vault-Moderate(w/s), Right Post Calf-Moderate(w/s),Right Inner Buttock-Slight, and Left Mid Back-Moderate(w/s)   Condyloma acuminata 2001   Endometriosis 8/95   DepoLupron 4/96-10/96   Hiatal hernia    History of campylobacteriosis    HSV-2 infection    Hyperlipidemia    IBS (irritable bowel syndrome)    Internal thrombosed hemorrhoids    Migraine    without aura   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 30 tablet 6   lansoprazole (PREVACID) 30 MG capsule 1 capsule by mouth daily every AM 90 capsule 3   linaclotide (LINZESS) 145 MCG CAPS capsule Take 1 capsule (145 mcg total) by mouth daily before breakfast. 30 capsule 3   ondansetron (ZOFRAN) 4 MG tablet TAKE 1 TABLET (4 MG TOTAL) BY MOUTH DAILY AT 2 PM. AS NEEDED 24 tablet 2   valACYclovir (VALTREX) 1000 MG tablet TAKE 1 TABLET TWICE A DAY FOR 3 DAYS WITH SYMPTOM ONSET 180 tablet 1   beclomethasone (QVAR REDIHALER) 40 MCG/ACT inhaler Inhale 2 puffs into the lungs 2 (two) times daily. (Patient not taking: Reported on 05/04/2023) 10.6 g 5   No facility-administered medications prior to visit.    ROS Review of Systems  Constitutional:  Positive for fatigue (due to recent sinus infection.).  HENT:  Positive for sinus pressure, sinus pain and sore throat (sinus drainage).   Respiratory:  Positive for cough (slight) and shortness of breath (following cardiology).   Cardiovascular:  Negative for chest pain and leg swelling.  Gastrointestinal:  Negative for constipation, diarrhea, nausea and vomiting.  Musculoskeletal:  Positive for arthralgias. Negative for back pain.  Neurological:  Positive for headaches (due to sinus issues.).    Objective:  BP 118/80   Pulse 89   Temp (!) 97.5 F (36.4 C)   SpO2 100%   Physical Exam Constitutional:      General: She is not in acute distress. HENT:     Head: Normocephalic.     Left Ear: A middle ear effusion is present.     Nose:     Right Sinus: Maxillary sinus tenderness and frontal  sinus tenderness present.     Left Sinus: Maxillary sinus tenderness and frontal sinus tenderness present.     Mouth/Throat:     Pharynx: Posterior oropharyngeal erythema and postnasal drip present. No pharyngeal swelling.  Eyes:     Pupils: Pupils are equal, round, and reactive to light.  Cardiovascular:     Rate and Rhythm: Normal rate and regular rhythm.     Heart sounds: Normal heart sounds.  Pulmonary:     Effort: Pulmonary effort is normal.     Breath sounds: Normal breath sounds.  Abdominal:     General: Bowel sounds are normal.     Palpations: Abdomen is soft.  Musculoskeletal:     Right lower leg: No edema.     Left lower leg: No edema.  Skin:    General: Skin is warm.  Neurological:     General: No focal deficit present.     Mental Status: She is alert and oriented to person, place, and time.  Psychiatric:        Mood and Affect: Mood normal.        Behavior: Behavior normal.      Assessment & Plan:    Mylene was seen today for wellness exam .  Diagnoses and all orders for this visit:  Participant in health and wellness plan Adult wellness physical was conducted today. Importance of diet and exercise were discussed in detail.  We reviewed immunizations and gave recommendations regarding current immunization needed for age. Discussed updating shingrix, patient declines at this time.  Preventative health exams: pap due   Patient was advised yearly wellness exam and follow-up with PCP as scheduled.   Acute non-recurrent pansinusitis Recommended continued symptomatic treatment for sinusitis. Follow-up if symptoms worsen over the next few days or fail to improve in 1-2 weeks.    No orders of the defined types were placed in this encounter.   No orders of the defined types were placed in this encounter.   Follow-up: as needed.

## 2023-05-07 ENCOUNTER — Ambulatory Visit (HOSPITAL_BASED_OUTPATIENT_CLINIC_OR_DEPARTMENT_OTHER): Payer: PRIVATE HEALTH INSURANCE

## 2023-05-07 ENCOUNTER — Telehealth: Payer: Self-pay | Admitting: Gastroenterology

## 2023-05-07 ENCOUNTER — Ambulatory Visit (HOSPITAL_BASED_OUTPATIENT_CLINIC_OR_DEPARTMENT_OTHER): Payer: PRIVATE HEALTH INSURANCE | Admitting: Student

## 2023-05-07 ENCOUNTER — Encounter (HOSPITAL_BASED_OUTPATIENT_CLINIC_OR_DEPARTMENT_OTHER): Payer: Self-pay | Admitting: Student

## 2023-05-07 ENCOUNTER — Other Ambulatory Visit (HOSPITAL_BASED_OUTPATIENT_CLINIC_OR_DEPARTMENT_OTHER): Payer: Self-pay

## 2023-05-07 DIAGNOSIS — M5416 Radiculopathy, lumbar region: Secondary | ICD-10-CM

## 2023-05-07 MED ORDER — METHOCARBAMOL 500 MG PO TABS
500.0000 mg | ORAL_TABLET | Freq: Four times a day (QID) | ORAL | 0 refills | Status: AC
  Filled 2023-05-07: qty 40, 10d supply, fill #0

## 2023-05-07 MED ORDER — MELOXICAM 15 MG PO TABS
15.0000 mg | ORAL_TABLET | Freq: Every day | ORAL | 0 refills | Status: AC
  Filled 2023-05-07: qty 14, 14d supply, fill #0

## 2023-05-07 NOTE — Progress Notes (Signed)
Chief Complaint: Low back pain     History of Present Illness:    [name] [name] is a 52 y.o. female presenting today for evaluation of low back pain.  She does have a history of chronic low back pain and had a lumbar MRI performed on 02/18/2022 which revealed L3-L4 and L5-S1 disc degeneration, left foraminal protrusion at L3-L4, and disc bulging between L4-L5 and L5-S1.  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  She does state that she performs some repetitive bending to pick up items off the floor.  Pain is located in the mid to right side of the low back and is moderate in severity.  She has occasional pain numbness and tingling radiating down the right leg however this happens infrequently.  Pain is at its least while standing, however worsens with sitting, laying down, and going up stairs.  She has tried icing, and Epsom salt bath, and 1 muscle relaxer doubt any significant relief.  No anesthesia or bowel/bladder dysfunction.  Denies any previous surgeries or procedures involving her low back.  She is a patient of Dr. August Saucer.   Surgical History:   None  PMH/PSH/Family History/Social History/Meds/Allergies:    Past Medical History:  Diagnosis Date   Abnormal Pap smear, atypical squamous cells of undetermined sign (ASC-US) 10/98   CIN 1   Allergy    Asthma    Atypical nevus 07/29/2010   Right Medial Buttock-Moderate   Atypical nevus x 2 11/15/2006   Upper Right Back-Moderate and Lower Mid Back-Moderate to Marked (w/s)   Atypical nevus x 3 11/04/2007   Right Side-Slight to Moderate, Right Upper Side-Moderate, and Left Upper Breast-Moderate   Atypical nevus x 7 10/15/2006   Right Medial Hip-Moderate, Right Middle Hip-Slight, Right Lateral Hip-Mild, Right Axillary Vault-Moderate(w/s), Right Post Calf-Moderate(w/s),Right Inner Buttock-Slight, and Left Mid Back-Moderate(w/s)   Condyloma acuminata 2001    Endometriosis 8/95   DepoLupron 4/96-10/96   Hiatal hernia    History of campylobacteriosis    HSV-2 infection    Hyperlipidemia    IBS (irritable bowel syndrome)    Internal thrombosed hemorrhoids    Migraine    without aura   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   Right Upper Arm (tx p bx)   Past Surgical History:  Procedure Laterality Date   AUGMENTATION MAMMAPLASTY Bilateral    BREAST BIOPSY Left    benign   BREAST ENHANCEMENT SURGERY  02/2000   BREAST MASS EXCISION Right 12/01   fibroadenoma   EXPLORATORY LAPAROTOMY  8/95   endometriosis   Social History   Socioeconomic History   Marital status: Married    Spouse name: Not on file   Number of children: 0   Years of education: Not on file   Highest education level: Not on file  Occupational History    Employer: [employer]  Tobacco Use   Smoking status: Never   Smokeless tobacco: Never  Vaping Use   Vaping status: Never Used  Substance and Sexual Activity   Alcohol use: Yes    Comment: occasionally   Drug use: No   Sexual activity: Yes    Birth control/protection: None  Other Topics Concern   Not on file  Social History Narrative   Currently unemployed --- looking for  a job    International aid/development worker of Community education officer: Not on file  Food Insecurity: Not on file  Transportation Needs: Not on file  Physical Activity: Not on file  Stress: Not on file  Social Connections: Not on file   Family History  Problem Relation Age of Onset   Irritable bowel syndrome Mother    Hernia Mother    Ulcers Maternal Grandfather        gastrectomy   Leukemia Maternal Grandfather    Diabetes Maternal Grandfather    Prostate cancer Maternal Grandfather    Diabetes Maternal Grandmother    Coronary artery disease Maternal Grandmother    Stroke Maternal Grandmother    Heart failure Maternal Grandmother    Colon cancer Neg Hx    Esophageal cancer Neg Hx    Liver disease Neg Hx     Pancreatic cancer Neg Hx    Stomach cancer Neg Hx    Allergies  Allergen Reactions   Sulfonamide Derivatives Anaphylaxis   Aspirin     REACTION: gi upset   Doxycycline Nausea And Vomiting   Latex    Current Outpatient Medications  Medication Sig Dispense Refill   meloxicam (MOBIC) 15 MG tablet Take 1 tablet (15 mg total) by mouth daily for 14 days. 14 tablet 0   methocarbamol (ROBAXIN) 500 MG tablet Take 1 tablet (500 mg total) by mouth 4 (four) times daily for 10 days. 40 tablet 0   albuterol (VENTOLIN HFA) 108 (90 Base) MCG/ACT inhaler INHALE 1-2 PUFF(S) EVERY 4-6 HOURS AS NEEDED 8.5 each 1   beclomethasone (QVAR REDIHALER) 40 MCG/ACT inhaler Inhale 2 puffs into the lungs 2 (two) times daily. (Patient not taking: Reported on 05/04/2023) 10.6 g 5   famotidine (PEPCID) 20 MG tablet Take 1 tablet (20 mg total) by mouth at bedtime. 30 tablet 6   lansoprazole (PREVACID) 30 MG capsule 1 capsule by mouth daily every AM 90 capsule 3   linaclotide (LINZESS) 145 MCG CAPS capsule Take 1 capsule (145 mcg total) by mouth daily before breakfast. 30 capsule 3   ondansetron (ZOFRAN) 4 MG tablet TAKE 1 TABLET (4 MG TOTAL) BY MOUTH DAILY AT 2 PM. AS NEEDED 24 tablet 2   valACYclovir (VALTREX) 1000 MG tablet TAKE 1 TABLET TWICE A DAY FOR 3 DAYS WITH SYMPTOM ONSET 180 tablet 1   No current facility-administered medications for this visit.   No results found.  Review of Systems:   A ROS was performed including pertinent positives and negatives as documented in the HPI.  Physical Exam :   Constitutional: NAD and appears stated age Neurological: Alert and oriented Psych: Appropriate affect and cooperative There were no vitals taken for this visit.   Comprehensive Musculoskeletal Exam:    Tenderness palpation over the lower lumbar spine and right-sided paraspinal muscles.  Discomfort and slight decrease in ROM with lumbar rotation and flexion.  Negative straight leg raise bilaterally.  Knee flexion  and extension strength is 5/5 with 2+ patellar reflexes bilaterally.  Neurosensory exam intact.  Imaging:   Xray (lumbar spine 4 views): Loss of disc height most notable between L5 and S1.  No evidence of acute bony abnormality   I personally reviewed and interpreted the radiographs.   Assessment:   52 y.o. female with history of chronic low back pain presenting with current episode of pain since yesterday.  Pain is mainly confined to the mid and right side of the low back, with only occasional radiation down  into the leg.  Given this, I have discussed that there may be a muscular component given the repetitive motion she was performing prior to the pain yesterday.  I will send her some meloxicam and Robaxin to help control her symptoms.  Given her history, I have also recommended possible follow-ups with either Dr. August Saucer or Dr. Marchelle Gearing, both who she has seen in the past.  She will continue to monitor for improvement in her symptoms, and will consider follow-up as needed.  Plan :    -Start meloxicam 15 mg -Start Robaxin 500 mg as needed -Follow-up in clinic or with Dr August Saucer for further assessment    I personally saw and evaluated the patient, and participated in the management and treatment plan.  Hazle Nordmann, PA-C Orthopedics

## 2023-05-07 NOTE — Telephone Encounter (Signed)
Patient is on a GLP type medication she describes as "a compound of Wegovy." She took her injection on 05/05/23. Her EGD is scheduled for 05/11/23. Per our Anesthesia Dept policy, the patient will be rescheduled. She agrees to 06/04/23 at 4:00 pm. She will arrive at 3:00 pm.

## 2023-05-07 NOTE — Telephone Encounter (Signed)
PT scheduled for EGD 10/1 and has questions about her medications she was to stop taking. Please advise.

## 2023-05-10 ENCOUNTER — Ambulatory Visit (HOSPITAL_BASED_OUTPATIENT_CLINIC_OR_DEPARTMENT_OTHER)
Admission: RE | Admit: 2023-05-10 | Discharge: 2023-05-10 | Disposition: A | Discharge status: Home / Self Care | Payer: PRIVATE HEALTH INSURANCE | Source: Ambulatory Visit | Attending: Interventional Cardiology | Admitting: Interventional Cardiology

## 2023-05-10 DIAGNOSIS — R0602 Shortness of breath: Secondary | ICD-10-CM | POA: Insufficient documentation

## 2023-05-11 ENCOUNTER — Encounter: Payer: PRIVATE HEALTH INSURANCE | Admitting: Gastroenterology

## 2023-05-13 ENCOUNTER — Ambulatory Visit (INDEPENDENT_AMBULATORY_CARE_PROVIDER_SITE_OTHER): Payer: PRIVATE HEALTH INSURANCE | Admitting: Orthopedic Surgery

## 2023-05-13 ENCOUNTER — Encounter: Payer: Self-pay | Admitting: Orthopedic Surgery

## 2023-05-13 DIAGNOSIS — M545 Low back pain, unspecified: Secondary | ICD-10-CM

## 2023-05-13 MED ORDER — PREDNISONE 5 MG (21) PO TBPK: ORAL_TABLET | ORAL | 0 refills | Status: DC

## 2023-05-13 MED ORDER — TRAMADOL HCL 50 MG PO TABS: ORAL_TABLET | ORAL | 0 refills | Status: DC

## 2023-05-13 NOTE — Progress Notes (Addendum)
Office Visit Note   Patient: [name] [name]           Date of Birth: July 07, 1971           MRN: [id number] Visit Date: 05/13/2023 Requested by: [address], [place], [place] [address] [place],  [place] [address] PCP: Zola Button, Grayling Congress, DO  Subjective: Chief Complaint  Patient presents with   Lower Back - Pain    HPI: [name] [name] is a 52 y.o. female who presents to the office reporting 1 week history of incapacitating low back pain.  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  One of the residents was falling over who was not a large man.  She kept him upright but sustained an injury to her back at that time.  Reported stabbing pain and it was difficult for her to lay flat.  She did take some muscle relaxers and nonsteroidals which has helped some but she still reports weakness going up and down stairs as well as right-sided greater than left-sided back pain.  She has had some radicular symptoms down to the ankle and toes on the right.  Does have some pain with coughing and sneezing.  This is all new.  No prior history of any back problems prior to this event.  She had to stay out of work the following day which was last Friday.  She has been able to work some this week.  She does have a history of being very active and essentially running all of her life.  Symptoms are worse in the morning.  Has a history of taking prednisone during her college years when it was thought she may have had Crohn's disease but I did give her some side effects such as bad dreams..  Outside radiographs from a week ago reviewed and shows L5-S1 degenerative disc disease but otherwise minimal arthritis between the disc bases and minimal facet arthritis.  No spondylolisthesis.              ROS: All systems reviewed are negative as they relate to the chief complaint within the history of present illness.  Patient denies fevers or chills.  Assessment & Plan: Visit  Diagnoses:  1. Low back pain, unspecified back pain laterality, unspecified chronicity, unspecified whether sciatica present     Plan: Impression is low back pain with likely central disc protrusion slightly more on the right than the left.  She has positive straight leg raise bilaterally as well as positive contralateral straight leg raise for the right-hand side.  A little bit of hip flexion and ankle dorsiflexion weakness at 5- out of 5 on the right 5+ out of 5 everywhere else.  Reflexes symmetric 0 to 1+ out of 4 bilateral patella and Achilles.  Plan is Medrol Dosepak 6-day course which she may or may not take depending on how her symptoms evolve.  Tramadol for pain if she gets back into the way she was last Thursday.  Needs MRI scan of her lumbar spine to evaluate for possible large central disc with possible ESI's to follow.  MRI scan from a year ago did show small left-sided protrusion at L3-4.  I think that this protrusion has enlarged.  Because there is some weakness and this could possibly be a surgical problem repeat MRI scanning indicated.  Follow-up after that study.  Follow-Up Instructions: No follow-ups on file.   Orders:  Orders Placed This Encounter  Procedures   MR Lumbar Spine w/o contrast   Meds ordered this encounter  Medications   predniSONE (STERAPRED UNI-PAK 21 TAB) 5 MG (21) TBPK tablet    Sig: Take dosepak as directed    Dispense:  21 tablet    Refill:  0   traMADol (ULTRAM) 50 MG tablet    Sig: 1 po q 8 hrs prn pain    Dispense:  30 tablet    Refill:  0      Procedures: No procedures performed   Clinical Data: No additional findings.  Objective: Vital Signs: There were no vitals taken for this visit.  Physical Exam:  Constitutional: Patient appears well-developed HEENT:  Head: Normocephalic Eyes:EOM are normal Neck: Normal range of motion Cardiovascular: Normal rate Pulmonary/chest: Effort normal Neurologic: Patient is alert Skin: Skin is  warm Psychiatric: Patient has normal mood and affect  Ortho Exam: Ortho exam demonstrates difficulty getting from sitting to standing position.  Does have a little hip flexion weakness on the right 5- out of 5 along with ankle dorsiflexion on the right 5- out of 5 compared to 5+ out of 5 on the left.  Positive straight leg raise testing bilaterally.  No definite paresthesias L1 S1 bilaterally.  Reflexes 0 to 1+ out of 4 bilateral patella and Achilles.  Pedal pulses palpable.  Excellent muscle strength and tone in bilateral lower extremities which makes her weakness going up and down stairs all the more compelling.  Specialty Comments:  EXAM: MRI LUMBAR SPINE WITHOUT CONTRAST   TECHNIQUE: Multiplanar, multisequence MR imaging of the lumbar spine was performed. No intravenous contrast was administered.   COMPARISON:  None Available.   FINDINGS: Segmentation:  Standard.   Alignment:  Physiologic.   Vertebrae:  No fracture, evidence of discitis, or bone lesion.   Conus medullaris and cauda equina: Conus extends to the L1-2 level. Conus and cauda equina appear normal.   Paraspinal and other soft tissues: Negative for perispinal mass or inflammation   Disc levels:   L3-L4: Disc desiccation and narrowing with left foraminal protrusion at an annular fissure that may contact but does not compress the L3 nerve root.   L4-L5: Mild disc bulging   L5-S1:Disc narrowing and bulging mild endplate and facet spurring. No neural impingement   IMPRESSION: 1. 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 2. L3-4 small left foraminal protrusion and annular fissure but no nerve root compression at this level or throughout the lumbar spine.     Electronically Signed   By: Tiburcio Pea M.D.   On: 02/18/2022 21:05  Imaging: No results found.   PMFS History: Patient Active Problem List   Diagnosis Date Noted   HSV-2 infection 07/12/2022   Overweight with body mass index (BMI) of 27 to 27.9 in  adult 07/01/2021   Mild persistent asthma without complication 07/01/2021   Hyperlipidemia 07/01/2021   Mild intermittent asthma 03/19/2020   Overweight (BMI 25.0-29.9) 03/19/2020   Preventative health care 06/02/2019   Weight gain 06/02/2019   Nausea 06/02/2019   Adenomyosis 05/19/2019   INFECTIOUS COLITIS ENTERITIS AND GASTROENTERITIS 05/14/2009   Slow transit constipation 05/14/2009   IRRITABLE BOWEL SYNDROME 05/14/2009   INTERNAL THROMBOSED HEMORRHOIDS 12/31/2007   Past Medical History:  Diagnosis Date   Abnormal Pap smear, atypical squamous cells of undetermined sign (ASC-US) 10/98   CIN 1   Allergy    Asthma    Atypical nevus 07/29/2010   Right Medial Buttock-Moderate   Atypical nevus x 2 11/15/2006  Upper Right Back-Moderate and Lower Mid Back-Moderate to Marked (w/s)   Atypical nevus x 3 11/04/2007   Right Side-Slight to Moderate, Right Upper Side-Moderate, and Left Upper Breast-Moderate   Atypical nevus x 7 10/15/2006   Right Medial Hip-Moderate, Right Middle Hip-Slight, Right Lateral Hip-Mild, Right Axillary Vault-Moderate(w/s), Right Post Calf-Moderate(w/s),Right Inner Buttock-Slight, and Left Mid Back-Moderate(w/s)   Condyloma acuminata 2001   Endometriosis 8/95   DepoLupron 4/96-10/96   Hiatal hernia    History of campylobacteriosis    HSV-2 infection    Hyperlipidemia    IBS (irritable bowel syndrome)    Internal thrombosed hemorrhoids    Migraine    without aura   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   Right Upper Arm (tx p bx)    Family History  Problem Relation Age of Onset   Irritable bowel syndrome Mother    Hernia Mother    Ulcers Maternal Grandfather        gastrectomy   Leukemia Maternal Grandfather    Diabetes Maternal Grandfather    Prostate cancer Maternal Grandfather    Diabetes Maternal Grandmother    Coronary artery disease Maternal Grandmother    Stroke Maternal Grandmother    Heart failure Maternal Grandmother     Colon cancer Neg Hx    Esophageal cancer Neg Hx    Liver disease Neg Hx    Pancreatic cancer Neg Hx    Stomach cancer Neg Hx     Past Surgical History:  Procedure Laterality Date   AUGMENTATION MAMMAPLASTY Bilateral    BREAST BIOPSY Left    benign   BREAST ENHANCEMENT SURGERY  02/2000   BREAST MASS EXCISION Right 12/01   fibroadenoma   EXPLORATORY LAPAROTOMY  8/95   endometriosis   Social History   Occupational History    Employer: [employer]  Tobacco Use   Smoking status: Never   Smokeless tobacco: Never  Vaping Use   Vaping status: Never Used  Substance and Sexual Activity   Alcohol use: Yes    Comment: occasionally   Drug use: No   Sexual activity: Yes    Birth control/protection: None

## 2023-05-19 ENCOUNTER — Ambulatory Visit: Payer: PRIVATE HEALTH INSURANCE | Admitting: Orthopedic Surgery

## 2023-05-20 ENCOUNTER — Telehealth: Payer: Self-pay | Admitting: Gastroenterology

## 2023-05-20 NOTE — Telephone Encounter (Signed)
Inbound call from patient requesting a call from nurse ASAP due to believing that she is badly impacted and is wanting to discuss. Please advise.

## 2023-05-20 NOTE — Telephone Encounter (Signed)
Spoke with the patient. She feels very constipated. She feels stool down low but is unable to evacuate. The patient started a prednisone taper given by orthopedic doctor. She has not moved her bowels since 05/16/23. She has taken magnesium citrate and her normal dose of Linzess. She has moved "a tiny amount today." She has not tried any other treatments. Could she try mineral oil enema or saline enema?

## 2023-05-24 ENCOUNTER — Telehealth: Payer: Self-pay | Admitting: Orthopedic Surgery

## 2023-05-24 ENCOUNTER — Ambulatory Visit
Admission: RE | Admit: 2023-05-24 | Discharge: 2023-05-24 | Disposition: A | Discharge status: Home / Self Care | Payer: Self-pay | Source: Ambulatory Visit | Attending: Orthopedic Surgery | Admitting: Orthopedic Surgery

## 2023-05-24 DIAGNOSIS — M545 Low back pain, unspecified: Secondary | ICD-10-CM

## 2023-05-24 NOTE — Telephone Encounter (Signed)
Called pt left vm that pt need an MRI review appt with Dr August Saucer 2 wks after 10/14.Pt MRI is 10/14

## 2023-05-26 NOTE — Telephone Encounter (Signed)
Please advise patient to use glycerine suppository followed by bowel purge with Miralax if has persistent constipation, follow up in office visit with me or APP, next available. Thanks

## 2023-05-28 ENCOUNTER — Other Ambulatory Visit (HOSPITAL_BASED_OUTPATIENT_CLINIC_OR_DEPARTMENT_OTHER): Payer: Self-pay | Admitting: Obstetrics & Gynecology

## 2023-05-28 ENCOUNTER — Encounter (HOSPITAL_BASED_OUTPATIENT_CLINIC_OR_DEPARTMENT_OTHER): Payer: Self-pay | Admitting: Obstetrics & Gynecology

## 2023-05-28 DIAGNOSIS — B009 Herpesviral infection, unspecified: Secondary | ICD-10-CM

## 2023-06-04 ENCOUNTER — Ambulatory Visit (AMBULATORY_SURGERY_CENTER): Payer: PRIVATE HEALTH INSURANCE | Admitting: Gastroenterology

## 2023-06-04 ENCOUNTER — Encounter: Payer: Self-pay | Admitting: Gastroenterology

## 2023-06-04 VITALS — BP 146/88 | HR 84 | Temp 98.7°F | Resp 14 | Ht 67.0 in | Wt 149.0 lb

## 2023-06-04 DIAGNOSIS — K2270 Barrett's esophagus without dysplasia: Secondary | ICD-10-CM

## 2023-06-04 DIAGNOSIS — K219 Gastro-esophageal reflux disease without esophagitis: Secondary | ICD-10-CM | POA: Diagnosis not present

## 2023-06-04 MED ORDER — SODIUM CHLORIDE 0.9 % IV SOLN
500.0000 mL | Freq: Once | INTRAVENOUS | Status: DC
Start: 2023-06-04 — End: 2023-06-04

## 2023-06-04 NOTE — Progress Notes (Unsigned)
[facility] History and Physical   Primary Care Physician:  Zola Button, Grayling Congress, DO   Reason for Procedure:  Barrett's esophagus, GERD  Plan:    EGD with possible interventions as needed     HPI: [name] [name] is a very pleasant 52 y.o. female here for surveillance EGD for GERD and Barrett's esophagus.   The risks and benefits as well as alternatives of endoscopic procedure(s) have been discussed and reviewed. All questions answered. The patient agrees to proceed.    Past Medical History:  Diagnosis Date   Abnormal Pap smear, atypical squamous cells of undetermined sign (ASC-US) 10/98   CIN 1   Allergy    Asthma    Atypical nevus 07/29/2010   Right Medial Buttock-Moderate   Atypical nevus x 2 11/15/2006   Upper Right Back-Moderate and Lower Mid Back-Moderate to Marked (w/s)   Atypical nevus x 3 11/04/2007   Right Side-Slight to Moderate, Right Upper Side-Moderate, and Left Upper Breast-Moderate   Atypical nevus x 7 10/15/2006   Right Medial Hip-Moderate, Right Middle Hip-Slight, Right Lateral Hip-Mild, Right Axillary Vault-Moderate(w/s), Right Post Calf-Moderate(w/s),Right Inner Buttock-Slight, and Left Mid Back-Moderate(w/s)   Condyloma acuminata 2001   Endometriosis 8/95   DepoLupron 4/96-10/96   Hiatal hernia    History of campylobacteriosis    HSV-2 infection    Hyperlipidemia    IBS (irritable bowel syndrome)    Internal thrombosed hemorrhoids    Migraine    without aura   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 Authorizing Provider  lansoprazole (PREVACID)  30 MG capsule 1 capsule by mouth daily every AM 03/10/23  Yes Trinika Cortese, Eleonore Chiquito, MD  linaclotide (LINZESS) 145 MCG CAPS capsule Take 1 capsule (145 mcg total) by mouth daily before breakfast. 03/10/23  Yes Tyrihanna Wingert, Eleonore Chiquito, MD  pseudoephedrine (SUDAFED) 30 MG tablet Take 30 mg by mouth every 4 (four) hours as needed for congestion.   Yes [provider]  Semaglutide-Weight Management (WEGOVY) 1 MG/0.5ML SOAJ Inject 1 mg into the skin once a week.   Yes [provider]  valACYclovir (VALTREX) 1000 MG tablet TAKE 1 TABLET TWICE A DAY FOR 3 DAYS WITH SYMPTOM ONSET 05/31/23  Yes Jerene Bears, MD  albuterol (VENTOLIN HFA) 108 (90 Base) MCG/ACT inhaler INHALE 1-2 PUFF(S) EVERY 4-6 HOURS AS NEEDED 02/18/22   Zola Button, Grayling Congress, DO  beclomethasone (QVAR REDIHALER) 40 MCG/ACT inhaler Inhale 2 puffs into the lungs 2 (two) times daily. Patient not taking: Reported on 05/04/2023 01/22/22   Zola Button, Grayling Congress, DO  famotidine (PEPCID) 20 MG tablet Take 1 tablet (20 mg total) by mouth at bedtime. Patient not taking: Reported on 06/04/2023 03/10/23   Napoleon Form, MD  ondansetron (ZOFRAN) 4 MG tablet TAKE 1 TABLET (4 MG TOTAL) BY MOUTH DAILY AT 2 PM. AS NEEDED 03/12/22   Napoleon Form, MD  traMADol (ULTRAM) 50 MG tablet 1 po q 8 hrs prn pain 05/13/23   Cammy Copa, MD    Current Outpatient Medications  Medication Sig Dispense Refill  lansoprazole (PREVACID) 30 MG capsule 1 capsule by mouth daily every AM 90 capsule 3   linaclotide (LINZESS) 145 MCG CAPS capsule Take 1 capsule (145 mcg total) by mouth daily before breakfast. 30 capsule 3   pseudoephedrine (SUDAFED) 30 MG tablet Take 30 mg by mouth every 4 (four) hours as needed for congestion.     Semaglutide-Weight Management (WEGOVY) 1 MG/0.5ML SOAJ Inject 1 mg into the skin once a week.     valACYclovir (VALTREX) 1000 MG tablet TAKE 1 TABLET TWICE A DAY FOR 3 DAYS WITH SYMPTOM ONSET 180 tablet 0   albuterol  (VENTOLIN HFA) 108 (90 Base) MCG/ACT inhaler INHALE 1-2 PUFF(S) EVERY 4-6 HOURS AS NEEDED 8.5 each 1   beclomethasone (QVAR REDIHALER) 40 MCG/ACT inhaler Inhale 2 puffs into the lungs 2 (two) times daily. (Patient not taking: Reported on 05/04/2023) 10.6 g 5   famotidine (PEPCID) 20 MG tablet Take 1 tablet (20 mg total) by mouth at bedtime. (Patient not taking: Reported on 06/04/2023) 30 tablet 6   ondansetron (ZOFRAN) 4 MG tablet TAKE 1 TABLET (4 MG TOTAL) BY MOUTH DAILY AT 2 PM. AS NEEDED 24 tablet 2   traMADol (ULTRAM) 50 MG tablet 1 po q 8 hrs prn pain 30 tablet 0   Current Facility-Administered Medications  Medication Dose Route Frequency Provider Last Rate Last Admin   0.9 %  sodium chloride infusion  500 mL Intravenous Once Lyliana Dicenso, Eleonore Chiquito, MD        Allergies as of 06/04/2023 - Review Complete 06/04/2023  Allergen Reaction Noted   Sulfonamide derivatives Anaphylaxis 01/27/2023   Aspirin  05/14/2009   Doxycycline Nausea And Vomiting 11/15/2012   Latex  07/08/2011    Family History  Problem Relation Age of Onset   Irritable bowel syndrome Mother    Hernia Mother    Ulcers Maternal Grandfather        gastrectomy   Leukemia Maternal Grandfather    Diabetes Maternal Grandfather    Prostate cancer Maternal Grandfather    Diabetes Maternal Grandmother    Coronary artery disease Maternal Grandmother    Stroke Maternal Grandmother    Heart failure Maternal Grandmother    Colon cancer Neg Hx    Esophageal cancer Neg Hx    Liver disease Neg Hx    Pancreatic cancer Neg Hx    Stomach cancer Neg Hx     Social History   Socioeconomic History   Marital status: Married    Spouse name: Not on file   Number of children: 0   Years of education: Not on file   Highest education level: Not on file  Occupational History    Employer: [employer]  Tobacco Use   Smoking status: Never   Smokeless tobacco: Never  Vaping Use   Vaping status: Never Used  Substance and Sexual  Activity   Alcohol use: Yes    Comment: occasionally   Drug use: No   Sexual activity: Yes    Birth control/protection: None  Other Topics Concern   Not on file  Social History Narrative   Currently unemployed --- looking for a job    Social Determinants of Health   Financial Resource Strain: Not on file  Food Insecurity: Not on file  Transportation Needs: Not on file  Physical Activity: Not on file  Stress: Not on file  Social Connections: Not on file  Intimate Partner Violence: Not on file    Review of Systems:  All other review of systems  negative except as mentioned in the HPI.  Physical Exam: Vital signs in last 24 hours: BP 123/86   Pulse 79   Temp 98.7 F (37.1 C)   Ht 5\' 7"  (1.702 m)   Wt 149 lb (67.6 kg)   SpO2 100%   BMI 23.34 kg/m  General:   Alert, NAD Lungs:  Clear .   Heart:  Regular rate and rhythm Abdomen:  Soft, nontender and nondistended. Neuro/Psych:  Alert and cooperative. Normal mood and affect. A and O x 3  Reviewed labs, radiology imaging, old records and pertinent past GI work up  Patient is appropriate for planned procedure(s) and anesthesia in an ambulatory setting   K. Scherry Ran , MD [phone]

## 2023-06-04 NOTE — Progress Notes (Unsigned)
1622 Robinul 0.1 mg IV given due large amount of secretions upon assessment.  MD made aware, vss

## 2023-06-04 NOTE — Op Note (Signed)
[facility] Patient Name: [name] [name] Procedure Date: 06/04/2023 4:21 PM MRN: [id number] Endoscopist: Napoleon Form , MD, [id number] Age: 52 Referring MD:  Date of Birth: 1971-01-13 Gender: Female Account #: [id number] Procedure:                Upper GI endoscopy Indications:              Surveillance for malignancy due to personal history                            of Barrett's esophagus, Esophageal reflux Medicines:                Monitored Anesthesia Care Procedure:                Pre-Anesthesia Assessment:                           - Prior to the procedure, a History and Physical                            was performed, and patient medications and                            allergies were reviewed. The patient's tolerance of                            previous anesthesia was also reviewed. The risks                            and benefits of the procedure and the sedation                            options and risks were discussed with the patient.                            All questions were answered, and informed consent                            was obtained. Prior Anticoagulants: The patient has                            taken no anticoagulant or antiplatelet agents. ASA                            Grade Assessment: II - A patient with mild systemic                            disease. After reviewing the risks and benefits,                            the patient was deemed in satisfactory condition to                            undergo the procedure.  After obtaining informed consent, the endoscope was                            passed under direct vision. Throughout the                            procedure, the patient's blood pressure, pulse, and                            oxygen saturations were monitored continuously. The                            GIF [id number] #[id number] was introduced through the                             mouth, and advanced to the second part of duodenum.                            The upper GI endoscopy was accomplished without                            difficulty. The patient tolerated the procedure                            well. Scope In: Scope Out: Findings:                 There were esophageal mucosal changes suggestive of                            short-segment Barrett's esophagus present in the                            lower third of the esophagus. The maximum                            longitudinal extent of these mucosal changes was 2                            cm in length. Mucosa was biopsied with a cold                            forceps for histology in a targeted manner from 39                            to 41 cm from the incisors. One specimen bottle was                            sent to pathology.                           A 3 cm hiatal hernia was present.  The cardia and gastric fundus were normal on                            retroflexion.                           The stomach was normal.                           The examined duodenum was normal. Complications:            No immediate complications. Estimated Blood Loss:     Estimated blood loss was minimal. Impression:               - Esophageal mucosal changes suggestive of                            short-segment Barrett's esophagus. Biopsied.                           - 3 cm hiatal hernia.                           - Normal stomach.                           - Normal examined duodenum. Recommendation:           - Patient has a contact number available for                            emergencies. The signs and symptoms of potential                            delayed complications were discussed with the                            patient. Return to normal activities tomorrow.                            Written discharge instructions were provided to the                             patient.                           - Resume previous diet.                           - Continue present medications.                           - Await pathology results.                           - Repeat upper endoscopy after studies are complete                            for  surveillance based on pathology results. Napoleon Form, MD 06/04/2023 4:38:13 PM This report has been signed electronically.

## 2023-06-04 NOTE — Progress Notes (Unsigned)
Report given to PACU, vss 

## 2023-06-04 NOTE — Patient Instructions (Addendum)
Read the handouts given to you by your recovery room nurse.  Resume all of your previous medications today.  Be sure you take your GERD medication 1/2 hour before eating or else it won't work.  Read all of your discharge instructions.   Cut back on your spicy food and take your medication every day.  YOU HAD AN ENDOSCOPIC PROCEDURE TODAY AT THE [facility]:   Refer to the procedure report that was given to you for any specific questions about what was found during the examination.  If the procedure report does not answer your questions, please call your gastroenterologist to clarify.  If you requested that your care partner not be given the details of your procedure findings, then the procedure report has been included in a sealed envelope for you to review at your convenience later.  YOU SHOULD EXPECT: Some feelings of bloating in the abdomen. Passage of more gas than usual.  Walking can help get rid of the air that was put into your GI tract during the procedure and reduce the bloating. If you had a lower endoscopy (such as a colonoscopy or flexible sigmoidoscopy) you may notice spotting of blood in your stool or on the toilet paper. If you underwent a bowel prep for your procedure, you may not have a normal bowel movement for a few days.  Please Note:  You might notice some irritation and congestion in your nose or some drainage.  This is from the oxygen used during your procedure.  There is no need for concern and it should clear up in a day or so.  SYMPTOMS TO REPORT IMMEDIATELY:  Following upper endoscopy (EGD)  Vomiting of blood or coffee ground material  New chest pain or pain under the shoulder blades  Painful or persistently difficult swallowing  New shortness of breath  Fever of 100F or higher  Black, tarry-looking stools  For urgent or emergent issues, a gastroenterologist can be reached at any hour by calling [phone]. Do not use MyChart messaging for urgent  concerns.    DIET:  We do recommend a small meal at first, but then you may proceed to your regular diet.  Drink plenty of fluids but you should avoid alcoholic beverages if possible.  ACTIVITY:  You should plan to take it easy for the rest of today and you should NOT DRIVE or use heavy machinery until tomorrow (because of the sedation medicines used during the test).    FOLLOW UP: Our staff will call the number listed on your records the next business day following your procedure.  We will call around 7:15- 8:00 am to check on you and address any questions or concerns that you may have regarding the information given to you following your procedure. If we do not reach you, we will leave a message.     If any biopsies were taken you will be contacted by phone or by letter within the next 1-3 weeks.  Please call us at [phone] if you have not heard about the biopsies in 3 weeks.    SIGNATURES/CONFIDENTIALITY: You and/or your care partner have signed paperwork which will be entered into your electronic medical record.  These signatures attest to the fact that that the information above on your After Visit Summary has been reviewed and is understood.  Full responsibility of the confidentiality of this discharge information lies with you and/or your care-partner.

## 2023-06-07 ENCOUNTER — Telehealth: Payer: Self-pay

## 2023-06-07 NOTE — Telephone Encounter (Signed)
Follow up call to pt, lm for pt to call if having any difficulty with normal activities or eating and drinking.  Also to call if any other questions or concerns.  

## 2023-06-09 LAB — SURGICAL PATHOLOGY

## 2023-06-14 ENCOUNTER — Encounter: Payer: Self-pay | Admitting: Gastroenterology

## 2023-06-14 ENCOUNTER — Ambulatory Visit (INDEPENDENT_AMBULATORY_CARE_PROVIDER_SITE_OTHER): Payer: PRIVATE HEALTH INSURANCE | Admitting: Orthopedic Surgery

## 2023-06-14 DIAGNOSIS — G8929 Other chronic pain: Secondary | ICD-10-CM

## 2023-06-14 DIAGNOSIS — M5442 Lumbago with sciatica, left side: Secondary | ICD-10-CM | POA: Diagnosis not present

## 2023-06-14 DIAGNOSIS — M5416 Radiculopathy, lumbar region: Secondary | ICD-10-CM | POA: Diagnosis not present

## 2023-06-16 ENCOUNTER — Encounter: Payer: Self-pay | Admitting: Orthopedic Surgery

## 2023-06-16 NOTE — Progress Notes (Signed)
Office Visit Note   Patient: [name] [name]           Date of Birth: 08-07-1971           MRN: [id number] Visit Date: 06/14/2023 Requested by: [id number] [place], [place] [address] [place],  [place] [address] PCP: Zola Button, Grayling Congress, DO  Subjective: Chief Complaint  Patient presents with   Other     Review MRI    HPI: [name] [name] is a 52 y.o. female who presents to the office reporting low back pain and left leg pain.  Since she was last seen she had an MRI scan of her lumbar spine.  Does report some anterior and lateral thigh pain.  Her standing endurance is less than 15 minutes and her walking endurance has also diminished due to this radiculopathy.  MRI scan shows at L3-4 left foraminal disc protrusion which may contact the exiting left L3 nerve roots.  There is also eccentric disc bulge at L5-S1 which may also contact the exiting left L5 nerve root..                ROS: All systems reviewed are negative as they relate to the chief complaint within the history of present illness.  Patient denies fevers or chills.  Assessment & Plan: Visit Diagnoses:  1. Lumbar radiculopathy   2. Chronic left-sided low back pain with left-sided sciatica     Plan: Impression is left-sided radiculopathy and low back pain the patient does not appear to have operative pathology in her back but does have pathology in her back that could explain her symptoms which are worsening in severity.  Plan at this time is refer to Dr. Alvester Morin for left-sided L3-4 and L5-S1 foraminal injections.  Once the pain is abated we could consider some physical therapy but patient is fit trim and flexible based on exam.  She will follow-up with [name] as needed.  Follow-Up Instructions: No follow-ups on file.   Orders:  Orders Placed This Encounter  Procedures   Ambulatory referral to Physical Medicine Rehab   No orders of the defined types were placed in this encounter.     Procedures: No  procedures performed   Clinical Data: No additional findings.  Objective: Vital Signs: There were no vitals taken for this visit.  Physical Exam:  Constitutional: Patient appears well-developed HEENT:  Head: Normocephalic Eyes:EOM are normal Neck: Normal range of motion Cardiovascular: Normal rate Pulmonary/chest: Effort normal Neurologic: Patient is alert Skin: Skin is warm Psychiatric: Patient has normal mood and affect  Ortho Exam: Ortho exam demonstrates mildly positive nerve root tension signs on the left negative on the right.  Does have some paresthesias going down the anterior lateral left thigh.  No groin pain with internal/external rotation of either leg.  Has 5 out of 5 ankle dorsiflexion plantarflexion quad and hamstring strength.  Does have some pain with forward lateral bending.  Pedal pulses palpable.  Specialty Comments:  EXAM: MRI LUMBAR SPINE WITHOUT CONTRAST   TECHNIQUE: Multiplanar, multisequence MR imaging of the lumbar spine was performed. No intravenous contrast was administered.   COMPARISON:  None Available.   FINDINGS: Segmentation:  Standard.   Alignment:  Physiologic.   Vertebrae:  No fracture, evidence of discitis, or bone lesion.   Conus medullaris and cauda equina: Conus extends to the L1-2 level. Conus and cauda equina appear normal.   Paraspinal and other soft tissues: Negative for perispinal mass or inflammation  Disc levels:   L3-L4: Disc desiccation and narrowing with left foraminal protrusion at an annular fissure that may contact but does not compress the L3 nerve root.   L4-L5: Mild disc bulging   L5-S1:Disc narrowing and bulging mild endplate and facet spurring. No neural impingement   IMPRESSION: 1. 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 2. L3-4 small left foraminal protrusion and annular fissure but no nerve root compression at this level or throughout the lumbar spine.     Electronically Signed   By: Tiburcio Pea M.D.   On: 02/18/2022 21:05  Imaging: No results found.   PMFS History: Patient Active Problem List   Diagnosis Date Noted   HSV-2 infection 07/12/2022   Overweight with body mass index (BMI) of 27 to 27.9 in adult 07/01/2021   Mild persistent asthma without complication 07/01/2021   Hyperlipidemia 07/01/2021   Mild intermittent asthma 03/19/2020   Overweight (BMI 25.0-29.9) 03/19/2020   Preventative health care 06/02/2019   Weight gain 06/02/2019   Nausea 06/02/2019   Adenomyosis 05/19/2019   INFECTIOUS COLITIS ENTERITIS AND GASTROENTERITIS 05/14/2009   Slow transit constipation 05/14/2009   IRRITABLE BOWEL SYNDROME 05/14/2009   INTERNAL THROMBOSED HEMORRHOIDS 12/31/2007   Past Medical History:  Diagnosis Date   Abnormal Pap smear, atypical squamous cells of undetermined sign (ASC-US) 10/98   CIN 1   Allergy    Asthma    Atypical nevus 07/29/2010   Right Medial Buttock-Moderate   Atypical nevus x 2 11/15/2006   Upper Right Back-Moderate and Lower Mid Back-Moderate to Marked (w/s)   Atypical nevus x 3 11/04/2007   Right Side-Slight to Moderate, Right Upper Side-Moderate, and Left Upper Breast-Moderate   Atypical nevus x 7 10/15/2006   Right Medial Hip-Moderate, Right Middle Hip-Slight, Right Lateral Hip-Mild, Right Axillary Vault-Moderate(w/s), Right Post Calf-Moderate(w/s),Right Inner Buttock-Slight, and Left Mid Back-Moderate(w/s)   Condyloma acuminata 2001   Endometriosis 8/95   DepoLupron 4/96-10/96   Hiatal hernia    History of campylobacteriosis    HSV-2 infection    Hyperlipidemia    IBS (irritable bowel syndrome)    Internal thrombosed hemorrhoids    Migraine    without aura   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   Right Upper Arm (tx p bx)    Family History  Problem Relation Age of Onset   Irritable bowel syndrome Mother    Hernia Mother    Ulcers Maternal Grandfather        gastrectomy   Leukemia Maternal Grandfather     Diabetes Maternal Grandfather    Prostate cancer Maternal Grandfather    Diabetes Maternal Grandmother    Coronary artery disease Maternal Grandmother    Stroke Maternal Grandmother    Heart failure Maternal Grandmother    Colon cancer Neg Hx    Esophageal cancer Neg Hx    Liver disease Neg Hx    Pancreatic cancer Neg Hx    Stomach cancer Neg Hx     Past Surgical History:  Procedure Laterality Date   AUGMENTATION MAMMAPLASTY Bilateral    BREAST BIOPSY Left    benign   BREAST ENHANCEMENT SURGERY  02/2000   BREAST MASS EXCISION Right 12/01   fibroadenoma   EXPLORATORY LAPAROTOMY  8/95   endometriosis   Social History   Occupational History    Employer: [employer]  Tobacco Use   Smoking status: Never   Smokeless tobacco: Never  Vaping Use   Vaping status: Never Used  Substance and Sexual Activity  Alcohol use: Yes    Comment: occasionally   Drug use: No   Sexual activity: Yes    Birth control/protection: None

## 2023-07-19 ENCOUNTER — Encounter (HOSPITAL_BASED_OUTPATIENT_CLINIC_OR_DEPARTMENT_OTHER): Payer: Self-pay | Admitting: Obstetrics & Gynecology

## 2023-07-19 ENCOUNTER — Ambulatory Visit (INDEPENDENT_AMBULATORY_CARE_PROVIDER_SITE_OTHER): Payer: PRIVATE HEALTH INSURANCE | Admitting: Obstetrics & Gynecology

## 2023-07-19 ENCOUNTER — Other Ambulatory Visit (HOSPITAL_COMMUNITY)
Admission: RE | Admit: 2023-07-19 | Discharge: 2023-07-19 | Disposition: A | Discharge status: Home / Self Care | Payer: Self-pay | Source: Ambulatory Visit | Attending: Obstetrics & Gynecology | Admitting: Obstetrics & Gynecology

## 2023-07-19 VITALS — BP 118/77 | HR 95 | Ht 67.25 in | Wt 146.8 lb

## 2023-07-19 DIAGNOSIS — Z01419 Encounter for gynecological examination (general) (routine) without abnormal findings: Secondary | ICD-10-CM

## 2023-07-19 DIAGNOSIS — R4589 Other symptoms and signs involving emotional state: Secondary | ICD-10-CM

## 2023-07-19 DIAGNOSIS — Z124 Encounter for screening for malignant neoplasm of cervix: Secondary | ICD-10-CM | POA: Insufficient documentation

## 2023-07-19 DIAGNOSIS — E7800 Pure hypercholesterolemia, unspecified: Secondary | ICD-10-CM

## 2023-07-19 DIAGNOSIS — B009 Herpesviral infection, unspecified: Secondary | ICD-10-CM | POA: Diagnosis not present

## 2023-07-19 DIAGNOSIS — E785 Hyperlipidemia, unspecified: Secondary | ICD-10-CM | POA: Diagnosis not present

## 2023-07-19 DIAGNOSIS — Z659 Problem related to unspecified psychosocial circumstances: Secondary | ICD-10-CM

## 2023-07-19 MED ORDER — FLUOXETINE HCL 10 MG PO CAPS: 10.0000 mg | ORAL_CAPSULE | Freq: Every day | ORAL | 1 refills | Status: DC

## 2023-07-19 MED ORDER — VALACYCLOVIR HCL 1 G PO TABS: ORAL_TABLET | ORAL | 3 refills | Status: DC

## 2023-07-19 MED ORDER — LORAZEPAM 0.5 MG PO TABS: 0.5000 mg | ORAL_TABLET | Freq: Every day | ORAL | 0 refills | Status: DC | PRN

## 2023-07-19 NOTE — Progress Notes (Unsigned)
52 y.o. G0P0000 Married White or Caucasian female here for annual exam.  Lots of stressors.  Having a lot of anxiety.  Has used a few of her mother's ativan.  Would like rx to have on hand when needed.  Seeing therapist.  Having increased sadness with the stressors in life.  Crying a lot.  Feels she would personally benefit as well from antidepressant.  Options reviewed.  Denies vaginally bleeding.  Having decreased libido.  She does not want to be on therapy.    Still working at [employer].    Takes valtrex daily.  Needs RF.  Is on Wegovy.  This is written by Burtis Junes in [place].    No LMP recorded. (Menstrual status: Perimenopausal).          Sexually active: No.  The current method of family planning is post menopausal status.     Exercising: Yes.    Smoker:  no  Health Maintenance: Pap:  2021 History of abnormal Pap:  >10 years MMG:  11/09/2022 Colonoscopy:  09/2021, follow up 5 years BMD:   guidelines reviewed Screening Labs: ordered   reports that she has never smoked. She has never used smokeless tobacco. She reports current alcohol use. She reports that she does not use drugs.  Past Medical History:  Diagnosis Date   Abnormal Pap smear, atypical squamous cells of undetermined sign (ASC-US) 10/98   CIN 1   Allergy    Asthma    Atypical nevus 07/29/2010   Right Medial Buttock-Moderate   Atypical nevus x 2 11/15/2006   Upper Right Back-Moderate and Lower Mid Back-Moderate to Marked (w/s)   Atypical nevus x 3 11/04/2007   Right Side-Slight to Moderate, Right Upper Side-Moderate, and Left Upper Breast-Moderate   Atypical nevus x 7 10/15/2006   Right Medial Hip-Moderate, Right Middle Hip-Slight, Right Lateral Hip-Mild, Right Axillary Vault-Moderate(w/s), Right Post Calf-Moderate(w/s),Right Inner Buttock-Slight, and Left Mid Back-Moderate(w/s)   Condyloma acuminata 2001   Endometriosis 8/95   DepoLupron 4/96-10/96   Hiatal hernia    History of campylobacteriosis     HSV-2 infection    Hyperlipidemia    IBS (irritable bowel syndrome)    Internal thrombosed hemorrhoids    Migraine    without aura   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 30 capsule 1   lansoprazole (PREVACID) 30 MG capsule 1 capsule by mouth daily every AM 90 capsule 3   linaclotide (LINZESS) 145 MCG CAPS capsule Take 1 capsule (145 mcg total) by mouth daily before breakfast. 30 capsule 3   LORazepam (ATIVAN) 0.5 MG tablet Take 1 tablet (0.5 mg total) by mouth daily as needed for anxiety. 30 tablet 0   ondansetron (ZOFRAN) 4 MG tablet TAKE 1 TABLET (4 MG TOTAL) BY MOUTH DAILY AT 2 PM. AS NEEDED 24 tablet 2   pseudoephedrine (SUDAFED) 30 MG tablet Take 30 mg by  mouth every 4 (four) hours as needed for congestion.     Semaglutide-Weight Management (WEGOVY) 1 MG/0.5ML SOAJ Inject 1 mg into the skin once a week.     traMADol (ULTRAM) 50 MG tablet 1 po q 8 hrs prn pain 30 tablet 0   beclomethasone (QVAR REDIHALER) 40 MCG/ACT inhaler Inhale 2 puffs into the lungs 2 (two) times daily. (Patient not taking: Reported on 05/04/2023) 10.6 g 5   famotidine (PEPCID) 20 MG tablet Take 1 tablet (20 mg total) by mouth at bedtime. (Patient not taking: Reported on 06/04/2023) 30 tablet 6   valACYclovir (VALTREX) 1000 MG tablet Increase to 1 tablet bid x 3 days with symptoms. 90 tablet 3   No current facility-administered medications for this  visit.    Family History  Problem Relation Age of Onset   Irritable bowel syndrome Mother    Hernia Mother    Ulcers Maternal Grandfather        gastrectomy   Leukemia Maternal Grandfather    Diabetes Maternal Grandfather    Prostate cancer Maternal Grandfather    Diabetes Maternal Grandmother    Coronary artery disease Maternal Grandmother    Stroke Maternal Grandmother    Heart failure Maternal Grandmother    Colon cancer Neg Hx    Esophageal cancer Neg Hx    Liver disease Neg Hx    Pancreatic cancer Neg Hx    Stomach cancer Neg Hx     ROS: Constitutional: negative Genitourinary:negative  Exam:   BP 118/77 (BP Location: Right Arm, Patient Position: Sitting, Cuff Size: Normal)   Pulse 95   Ht 5' 7.25" (1.708 m)   Wt 146 lb 12.8 oz (66.6 kg)   BMI 22.82 kg/m   Height: 5' 7.25" (170.8 cm)  General appearance: alert, cooperative and appears stated age Head: Normocephalic, without obvious abnormality, atraumatic Neck: no adenopathy, supple, symmetrical, trachea midline and thyroid normal to inspection and palpation Lungs: clear to auscultation bilaterally Breasts: normal appearance, no masses or tenderness Heart: regular rate and rhythm Abdomen: soft, non-tender; bowel sounds normal; no masses,  no organomegaly Extremities: extremities normal, atraumatic, no cyanosis or edema Skin: Skin color, texture, turgor normal. No rashes or lesions Lymph nodes: Cervical, supraclavicular, and axillary nodes normal. No abnormal inguinal nodes palpated Neurologic: Grossly normal   Pelvic: External genitalia:  no lesions              Urethra:  normal appearing urethra with no masses, tenderness or lesions              Bartholins and Skenes: normal                 Vagina: normal appearing vagina with normal color and no discharge, no lesions              Cervix: no lesions              Pap taken: Yes.   Bimanual Exam:  Uterus:  normal size, contour, position, consistency,  mobility, non-tender              Adnexa: normal adnexa and no mass, fullness, tenderness               Rectovaginal: Confirms               Anus:  normal sphincter tone, no lesions  Chaperone, Ina Homes, CMA, was present for exam.  Assessment/Plan: 1. Well woman exam with routine gynecological exam - Pap smear with HR HPV  obtained today - Mammogram 11/09/2022 - Colonoscopy 09/2021, follow up 5 years - Bone mineral density not indicated at this time - lab work ordered for today - vaccines reviewed/updated  2. Cervical cancer screening - Cytology - PAP( [facility])  3. Elevated lipids - Hemoglobin A1c - Comprehensive metabolic panel - Lipid panel  4. HSV-2 infection - valACYclovir (VALTREX) 1000 MG tablet; Increase to 1 tablet bid x 3 days with symptoms.  Dispense: 90 tablet; Refill: 3  5. Depressed mood - will start SSRI. Pt concerned about any possible weight gain so will start with 10mg  dosage - FLUoxetine (PROZAC) 10 MG capsule; Take 1 capsule (10 mg total) by mouth daily.  Dispense: 30 capsule; Refill: 1 capsule; Refill: 1  6. Other social stressor - small rx for ativan 0.5mg  daily prn.  Advised to use carefully and rarely, not use wiith ETOH.  If needs RF will need to refer to psychiatry.  She is seeing a therapist and feels this relationship is good.  Pt did ask for note to be made sensitive.

## 2023-07-20 LAB — COMPREHENSIVE METABOLIC PANEL
ALT: 16 [IU]/L (ref 0–32)
AST: 23 [IU]/L (ref 0–40)
Albumin: 4.7 g/dL (ref 3.8–4.9)
Alkaline Phosphatase: 69 [IU]/L (ref 44–121)
BUN/Creatinine Ratio: 16 (ref 9–23)
BUN: 12 mg/dL (ref 6–24)
Bilirubin Total: 0.5 mg/dL (ref 0.0–1.2)
CO2: 23 mmol/L (ref 20–29)
Calcium: 9.9 mg/dL (ref 8.7–10.2)
Chloride: 101 mmol/L (ref 96–106)
Creatinine, Ser: 0.77 mg/dL (ref 0.57–1.00)
Globulin, Total: 2.5 g/dL (ref 1.5–4.5)
Glucose: 88 mg/dL (ref 70–99)
Potassium: 4.7 mmol/L (ref 3.5–5.2)
Sodium: 141 mmol/L (ref 134–144)
Total Protein: 7.2 g/dL (ref 6.0–8.5)
eGFR: 92 mL/min/{1.73_m2} (ref >59)

## 2023-07-20 LAB — LIPID PANEL
Chol/HDL Ratio: 3.4 {ratio} (ref 0.0–4.4)
Cholesterol, Total: 278 mg/dL — ABNORMAL HIGH (ref 100–199)
HDL: 82 mg/dL (ref >39)
LDL Chol Calc (NIH): 189 mg/dL — ABNORMAL HIGH (ref 0–99)
Triglycerides: 51 mg/dL (ref 0–149)
VLDL Cholesterol Cal: 7 mg/dL (ref 5–40)

## 2023-07-20 LAB — HEMOGLOBIN A1C
Est. average glucose Bld gHb Est-mCnc: 111 mg/dL
Hgb A1c MFr Bld: 5.5 % (ref 4.8–5.6)

## 2023-07-22 ENCOUNTER — Telehealth (HOSPITAL_BASED_OUTPATIENT_CLINIC_OR_DEPARTMENT_OTHER): Payer: Self-pay | Admitting: *Deleted

## 2023-07-22 LAB — CYTOLOGY - PAP
Adequacy: ABSENT
Diagnosis: NEGATIVE

## 2023-07-22 NOTE — Telephone Encounter (Signed)
Patient left a message  that she retuning the nurse call.

## 2023-07-28 ENCOUNTER — Other Ambulatory Visit (HOSPITAL_BASED_OUTPATIENT_CLINIC_OR_DEPARTMENT_OTHER): Payer: Self-pay

## 2023-08-11 ENCOUNTER — Other Ambulatory Visit (HOSPITAL_BASED_OUTPATIENT_CLINIC_OR_DEPARTMENT_OTHER): Payer: Self-pay | Admitting: Obstetrics & Gynecology

## 2023-08-11 DIAGNOSIS — R4589 Other symptoms and signs involving emotional state: Secondary | ICD-10-CM

## 2023-09-02 ENCOUNTER — Encounter: Payer: Self-pay | Admitting: Nurse Practitioner

## 2023-09-02 ENCOUNTER — Ambulatory Visit: Payer: PRIVATE HEALTH INSURANCE | Admitting: Nurse Practitioner

## 2023-09-02 VITALS — BP 120/82 | HR 95 | Temp 98.5°F

## 2023-09-02 DIAGNOSIS — J4530 Mild persistent asthma, uncomplicated: Secondary | ICD-10-CM

## 2023-09-02 DIAGNOSIS — J40 Bronchitis, not specified as acute or chronic: Secondary | ICD-10-CM

## 2023-09-02 MED ORDER — FLUCONAZOLE 150 MG PO TABS: 150.0000 mg | ORAL_TABLET | Freq: Once | ORAL | 1 refills | Status: AC

## 2023-09-02 MED ORDER — AZITHROMYCIN 250 MG PO TABS: ORAL_TABLET | ORAL | 0 refills | Status: AC

## 2023-09-02 NOTE — Progress Notes (Signed)
Acute Office Visit  Subjective:     Patient ID: [name] [name], female    DOB: November 20, 1970, 53 y.o.   MRN: [id number]  Chief Complaint  Patient presents with   Cough    HPI Patient is in today for c/o cough.   Cough present x 1 month (before christmas). Worse at night. Cough varies, sometimes dry and sometimes with sputum production.Symptoms are worse at night. NO chest pain currently but does feel some chest pressure when laying down at night. Also has some wheezing at night. When symptoms initially started she suspected strep. She tested negative for COVID at that time. Sore throat has improved but she does continue to cough constantly which is causing generalized muscle aches.   She reports hx of exercise induced asthma; although she has abluterol and Qvar prescribed, she does not use it daily. States she normally uses Qvar in the summer months. However due to coughing she reports she used Qvar and albuterol inhalers this morning for the first time in months.  Also reports due to cold weather she has been using a wood burning fireplace at home and suspects that fumes may be making her symptoms worse.    Review of Systems  Constitutional:  Positive for chills and malaise/fatigue. Negative for fever (unsure).  HENT:  Positive for congestion, sinus pain and sore throat. Negative for ear pain and hearing loss.   Respiratory:  Positive for cough, sputum production, shortness of breath and wheezing.   Cardiovascular:  Negative for chest pain and leg swelling.  Gastrointestinal:  Positive for nausea (with coughing).  Genitourinary: Negative.   Musculoskeletal:  Positive for myalgias.  Skin:  Negative for rash.  Neurological:  Positive for dizziness and headaches.        Objective:    BP 120/82 (BP Location: Left Arm, Patient Position: Sitting, Cuff Size: Normal)   Pulse 95   Temp 98.5 F (36.9 C)   LMP  (LMP Unknown) Comment: almost a year since last menstural  SpO2 99%     Physical Exam Constitutional:      General: She is not in acute distress.    Appearance: She is not ill-appearing.  HENT:     Head: Normocephalic.     Right Ear: Ear canal normal. A middle ear effusion is present.     Left Ear: Tympanic membrane and ear canal normal.  No middle ear effusion.     Nose:     Right Sinus: No maxillary sinus tenderness or frontal sinus tenderness.     Left Sinus: No maxillary sinus tenderness or frontal sinus tenderness.     Mouth/Throat:     Pharynx: Posterior oropharyngeal erythema present. No oropharyngeal exudate or postnasal drip.  Eyes:     Pupils: Pupils are equal, round, and reactive to light.  Cardiovascular:     Rate and Rhythm: Normal rate and regular rhythm.  Pulmonary:     Effort: Pulmonary effort is normal. No respiratory distress.     Breath sounds: Wheezing present.  Musculoskeletal:        General: Normal range of motion.  Lymphadenopathy:     Cervical: No cervical adenopathy.  Neurological:     General: No focal deficit present.     Mental Status: She is alert and oriented to person, place, and time.  Psychiatric:        Mood and Affect: Mood normal.        Behavior: Behavior normal.     No results found for  any visits on 09/02/23.      Assessment & Plan:   Problem List Items Addressed This Visit       Respiratory   Mild persistent asthma without complication   Other Visit Diagnoses       Bronchitis    -  Primary   Relevant Medications   azithromycin (ZITHROMAX) 250 MG tablet   fluconazole (DIFLUCAN) 150 MG tablet     Suspect asthma exacerbation vs bronchitis. Discussed option for short course of steriods; however, patient declined to due intense side effects in the past. Will treat with abx due to length of symptoms.  I did recommended patient start using Qvar daily as well as albuterol as needed. Also recommended otc supportive care measures at home. Discussed red flag symptoms and when to seek care.   Meds  ordered this encounter  Medications   azithromycin (ZITHROMAX) 250 MG tablet    Sig: Take 2 tablets on day 1, then 1 tablet daily on days 2 through 5    Dispense:  6 tablet    Refill:  0    Supervising Provider:   Erasmo Downer [[id number]]   fluconazole (DIFLUCAN) 150 MG tablet    Sig: Take 1 tablet (150 mg total) by mouth once for 1 dose.    Dispense:  1 tablet    Refill:  1    Supervising Provider:   Erasmo Downer [[id number]]   As needed.   Gloris Ham, NP

## 2023-09-03 ENCOUNTER — Other Ambulatory Visit: Payer: Self-pay | Admitting: Family Medicine

## 2023-09-03 DIAGNOSIS — J4520 Mild intermittent asthma, uncomplicated: Secondary | ICD-10-CM

## 2023-09-07 ENCOUNTER — Other Ambulatory Visit: Payer: Self-pay | Admitting: Nurse Practitioner

## 2023-09-07 DIAGNOSIS — J4520 Mild intermittent asthma, uncomplicated: Secondary | ICD-10-CM

## 2023-09-07 MED ORDER — QVAR REDIHALER 40 MCG/ACT IN AERB: 2.0000 | INHALATION_SPRAY | Freq: Two times a day (BID) | RESPIRATORY_TRACT | 5 refills | Status: AC

## 2023-10-04 ENCOUNTER — Other Ambulatory Visit: Payer: Self-pay | Admitting: Gastroenterology

## 2023-10-12 ENCOUNTER — Telehealth (HOSPITAL_BASED_OUTPATIENT_CLINIC_OR_DEPARTMENT_OTHER): Payer: Self-pay | Admitting: *Deleted

## 2023-10-12 ENCOUNTER — Other Ambulatory Visit (HOSPITAL_BASED_OUTPATIENT_CLINIC_OR_DEPARTMENT_OTHER): Payer: Self-pay | Admitting: Obstetrics & Gynecology

## 2023-10-12 DIAGNOSIS — F419 Anxiety disorder, unspecified: Secondary | ICD-10-CM

## 2023-10-12 DIAGNOSIS — R4589 Other symptoms and signs involving emotional state: Secondary | ICD-10-CM

## 2023-10-12 MED ORDER — LORAZEPAM 0.5 MG PO TABS: 0.5000 mg | ORAL_TABLET | Freq: Every day | ORAL | 0 refills | Status: AC | PRN

## 2023-10-12 MED ORDER — BUPROPION HCL ER (XL) 150 MG PO TB24: 150.0000 mg | ORAL_TABLET | Freq: Every day | ORAL | 2 refills | Status: DC

## 2023-10-12 NOTE — Telephone Encounter (Signed)
 Pt called to report that she does not feel that the prozac is working well for her. She has taken the 10 mg dosage for more than 2 weeks and noticed an increase in feeling of sadness. She is asking if provider would be willing to switch her to Wellbutrin as long as there are no contraindications for her. She does not want to take something that will cause her to gain weight. She also requests a refill on her ativan. Advised that I would send her request to provider and get back to her with recommendations.

## 2023-10-12 NOTE — Telephone Encounter (Signed)
 LMOVM that requested prescriptions have been sent to the pharmacy. Advised that she should give [name] an update in about a month on how well the medication is working.

## 2023-11-04 ENCOUNTER — Other Ambulatory Visit (HOSPITAL_BASED_OUTPATIENT_CLINIC_OR_DEPARTMENT_OTHER): Payer: Self-pay | Admitting: Obstetrics & Gynecology

## 2023-11-04 DIAGNOSIS — R4589 Other symptoms and signs involving emotional state: Secondary | ICD-10-CM

## 2023-11-24 ENCOUNTER — Other Ambulatory Visit: Payer: Self-pay | Admitting: Gastroenterology

## 2023-12-27 ENCOUNTER — Other Ambulatory Visit: Payer: Self-pay | Admitting: Gastroenterology

## 2023-12-27 DIAGNOSIS — K5901 Slow transit constipation: Secondary | ICD-10-CM

## 2024-01-23 ENCOUNTER — Other Ambulatory Visit (HOSPITAL_BASED_OUTPATIENT_CLINIC_OR_DEPARTMENT_OTHER): Payer: Self-pay | Admitting: Obstetrics & Gynecology

## 2024-01-23 DIAGNOSIS — R4589 Other symptoms and signs involving emotional state: Secondary | ICD-10-CM

## 2024-03-20 ENCOUNTER — Telehealth (HOSPITAL_BASED_OUTPATIENT_CLINIC_OR_DEPARTMENT_OTHER): Payer: Self-pay

## 2024-03-20 ENCOUNTER — Other Ambulatory Visit (HOSPITAL_BASED_OUTPATIENT_CLINIC_OR_DEPARTMENT_OTHER): Payer: Self-pay | Admitting: Certified Nurse Midwife

## 2024-03-20 NOTE — Telephone Encounter (Signed)
 Patient called and LMOM wanting to increase her Wellbutrin . She is currently taking Wellbutrin  XL 150mg . She states that it has been recommended by her counselors. Please advise. tbw

## 2024-03-24 ENCOUNTER — Telehealth (HOSPITAL_BASED_OUTPATIENT_CLINIC_OR_DEPARTMENT_OTHER): Payer: Self-pay

## 2024-03-24 ENCOUNTER — Other Ambulatory Visit (HOSPITAL_BASED_OUTPATIENT_CLINIC_OR_DEPARTMENT_OTHER): Payer: Self-pay | Admitting: Certified Nurse Midwife

## 2024-03-24 DIAGNOSIS — R4589 Other symptoms and signs involving emotional state: Secondary | ICD-10-CM

## 2024-03-24 MED ORDER — BUPROPION HCL ER (XL) 300 MG PO TB24: 300.0000 mg | ORAL_TABLET | Freq: Every day | ORAL | 5 refills | Status: AC

## 2024-03-24 NOTE — Telephone Encounter (Signed)
 Spoke with patient. She verified that she is currently taking Wellbutrin  XL 150mg  and would like for that to be increased to 300mg  with the recommendations of her therapist. tbw

## 2024-05-05 ENCOUNTER — Other Ambulatory Visit: Payer: Self-pay | Admitting: Gastroenterology

## 2024-05-25 ENCOUNTER — Telehealth: Payer: Self-pay | Admitting: Gastroenterology

## 2024-05-25 NOTE — Telephone Encounter (Signed)
 Inbound call from patient stating she no longer has insurance due to recently starting a job, but would like to know if she can receive some samples of linzess  . Patient states she hasn't been able to go to the bathroom in over a week and has tried other things and they just upset her stomach and don't help.  Requesting a call back  Please advise  Thank you

## 2024-05-25 NOTE — Telephone Encounter (Signed)
 Pt returned call. She reports that she has recently changed jobs and currently does not have insurance. She has an active Rx for Linzess  145 mcg. However even with [facility], she is not able to afford the medication right now. Will leave samples for the pt at the 2nd floor desk, and will recommend pt schedule the next available f/u appt.

## 2024-06-12 ENCOUNTER — Encounter: Payer: Self-pay | Admitting: Radiology

## 2024-07-26 ENCOUNTER — Ambulatory Visit (HOSPITAL_BASED_OUTPATIENT_CLINIC_OR_DEPARTMENT_OTHER): Payer: PRIVATE HEALTH INSURANCE | Admitting: Obstetrics & Gynecology

## 2024-08-06 ENCOUNTER — Other Ambulatory Visit (HOSPITAL_BASED_OUTPATIENT_CLINIC_OR_DEPARTMENT_OTHER): Payer: Self-pay | Admitting: Obstetrics & Gynecology

## 2024-08-06 DIAGNOSIS — B009 Herpesviral infection, unspecified: Secondary | ICD-10-CM

## 2024-08-07 NOTE — Telephone Encounter (Signed)
 Patient will receive additional refills at next office visit. 3/26

## 2024-10-25 ENCOUNTER — Ambulatory Visit (HOSPITAL_BASED_OUTPATIENT_CLINIC_OR_DEPARTMENT_OTHER): Payer: Self-pay | Admitting: Obstetrics & Gynecology
